# Patient Record
Sex: Female | Born: 1935 | Race: Black or African American | Hispanic: No | Marital: Single | Smoking: Former smoker
Health system: Southern US, Community
[De-identification: ages and names within clinical notes are randomized; demographics above are authoritative.]

## PROBLEM LIST (undated history)

## (undated) DIAGNOSIS — J45909 Unspecified asthma, uncomplicated: Secondary | ICD-10-CM

## (undated) DIAGNOSIS — K219 Gastro-esophageal reflux disease without esophagitis: Secondary | ICD-10-CM

## (undated) DIAGNOSIS — K5900 Constipation, unspecified: Secondary | ICD-10-CM

## (undated) DIAGNOSIS — I10 Essential (primary) hypertension: Secondary | ICD-10-CM

## (undated) DIAGNOSIS — K623 Rectal prolapse: Secondary | ICD-10-CM

## (undated) DIAGNOSIS — E119 Type 2 diabetes mellitus without complications: Secondary | ICD-10-CM

## (undated) DIAGNOSIS — F0390 Unspecified dementia without behavioral disturbance: Secondary | ICD-10-CM

## (undated) HISTORY — DX: Unspecified dementia, unspecified severity, without behavioral disturbance, psychotic disturbance, mood disturbance, and anxiety: F03.90

## (undated) HISTORY — DX: Rectal prolapse: K62.3

## (undated) HISTORY — PX: OTHER SURGICAL HISTORY: SHX169

## (undated) HISTORY — DX: Constipation, unspecified: K59.00

---

## 2016-04-25 ENCOUNTER — Encounter (HOSPITAL_BASED_OUTPATIENT_CLINIC_OR_DEPARTMENT_OTHER): Payer: Self-pay | Admitting: *Deleted

## 2016-04-25 ENCOUNTER — Emergency Department (HOSPITAL_BASED_OUTPATIENT_CLINIC_OR_DEPARTMENT_OTHER)
Admission: EM | Admit: 2016-04-25 | Discharge: 2016-04-25 | Disposition: A | Discharge status: Home / Self Care | Payer: Medicare Other | Attending: Emergency Medicine | Admitting: Emergency Medicine

## 2016-04-25 DIAGNOSIS — I10 Essential (primary) hypertension: Secondary | ICD-10-CM

## 2016-04-25 DIAGNOSIS — E119 Type 2 diabetes mellitus without complications: Secondary | ICD-10-CM | POA: Diagnosis not present

## 2016-04-25 DIAGNOSIS — R112 Nausea with vomiting, unspecified: Secondary | ICD-10-CM | POA: Diagnosis present

## 2016-04-25 DIAGNOSIS — Z79899 Other long term (current) drug therapy: Secondary | ICD-10-CM | POA: Insufficient documentation

## 2016-04-25 DIAGNOSIS — Z7984 Long term (current) use of oral hypoglycemic drugs: Secondary | ICD-10-CM | POA: Insufficient documentation

## 2016-04-25 DIAGNOSIS — L299 Pruritus, unspecified: Secondary | ICD-10-CM | POA: Diagnosis not present

## 2016-04-25 DIAGNOSIS — J45909 Unspecified asthma, uncomplicated: Secondary | ICD-10-CM | POA: Insufficient documentation

## 2016-04-25 HISTORY — DX: Gastro-esophageal reflux disease without esophagitis: K21.9

## 2016-04-25 HISTORY — DX: Essential (primary) hypertension: I10

## 2016-04-25 HISTORY — DX: Unspecified asthma, uncomplicated: J45.909

## 2016-04-25 HISTORY — DX: Type 2 diabetes mellitus without complications: E11.9

## 2016-04-25 LAB — CBC WITH DIFFERENTIAL/PLATELET
BASOS ABS: 0 10*3/uL (ref 0.0–0.1)
BASOS PCT: 0 %
EOS ABS: 0.1 10*3/uL (ref 0.0–0.7)
Eosinophils Relative: 2 %
HCT: 37 % (ref 36.0–46.0)
HEMOGLOBIN: 12.5 g/dL (ref 12.0–15.0)
Lymphocytes Relative: 32 %
Lymphs Abs: 2.4 10*3/uL (ref 0.7–4.0)
MCH: 29.8 pg (ref 26.0–34.0)
MCHC: 33.8 g/dL (ref 30.0–36.0)
MCV: 88.3 fL (ref 78.0–100.0)
MONO ABS: 0.6 10*3/uL (ref 0.1–1.0)
MONOS PCT: 7 %
NEUTROS PCT: 59 %
Neutro Abs: 4.4 10*3/uL (ref 1.7–7.7)
Platelets: 346 10*3/uL (ref 150–400)
RBC: 4.19 MIL/uL (ref 3.87–5.11)
RDW: 12.3 % (ref 11.5–15.5)
WBC: 7.4 10*3/uL (ref 4.0–10.5)

## 2016-04-25 LAB — COMPREHENSIVE METABOLIC PANEL
ALK PHOS: 50 U/L (ref 38–126)
ALT: 9 U/L — AB (ref 14–54)
AST: 20 U/L (ref 15–41)
Albumin: 3.6 g/dL (ref 3.5–5.0)
Anion gap: 9 (ref 5–15)
BILIRUBIN TOTAL: 0.4 mg/dL (ref 0.3–1.2)
BUN: 10 mg/dL (ref 6–20)
CALCIUM: 9.3 mg/dL (ref 8.9–10.3)
CO2: 27 mmol/L (ref 22–32)
CREATININE: 0.58 mg/dL (ref 0.44–1.00)
Chloride: 99 mmol/L — ABNORMAL LOW (ref 101–111)
GFR calc non Af Amer: >60 mL/min (ref >60)
Glucose, Bld: 130 mg/dL — ABNORMAL HIGH (ref 65–99)
Potassium: 4.4 mmol/L (ref 3.5–5.1)
SODIUM: 135 mmol/L (ref 135–145)
TOTAL PROTEIN: 7.4 g/dL (ref 6.5–8.1)

## 2016-04-25 MED ORDER — DIPHENHYDRAMINE HCL 25 MG PO CAPS
25.0000 mg | ORAL_CAPSULE | Freq: Once | ORAL | Status: AC
  Administered 2016-04-25: 25 mg via ORAL
  Filled 2016-04-25: qty 1

## 2016-04-25 MED ORDER — ONDANSETRON 4 MG PO TBDP
4.0000 mg | ORAL_TABLET | Freq: Once | ORAL | Status: AC
  Administered 2016-04-25: 4 mg via ORAL
  Filled 2016-04-25: qty 1

## 2016-04-25 MED ORDER — METOPROLOL TARTRATE 50 MG PO TABS
100.0000 mg | ORAL_TABLET | Freq: Once | ORAL | Status: AC
  Administered 2016-04-25: 100 mg via ORAL
  Filled 2016-04-25: qty 2

## 2016-04-25 NOTE — ED Triage Notes (Signed)
Nausea and itching all over since yesterday.

## 2016-04-25 NOTE — ED Provider Notes (Signed)
[facility] Provider Note   CSN: [id number] Arrival date & time: 04/25/16  1237     History   Chief Complaint Chief Complaint  Patient presents with  . Nausea    HPI Julie Colon is a 82 y.o. female.  The history is provided by the patient and a relative.  Rash   This is a new problem. Episode onset: 1 year. The problem has not changed since onset.The problem is associated with nothing. There has been no fever. Affected Location: whole body espcially extremities. The patient is experiencing no pain. Associated symptoms include itching. Associated symptoms comments: Vomited 3x last night, now with some nausea bout tolerating po. She has tried antihistamines for the symptoms. The treatment provided moderate relief.    Past Medical History:  Diagnosis Date  . Asthma   . Diabetes mellitus without complication (HCC)   . GERD (gastroesophageal reflux disease)   . Hypertension     There are no active problems to display for this patient.   History reviewed. No pertinent surgical history.  OB History    No data available       Home Medications    Prior to Admission medications   Medication Sig Start Date End Date Taking? Authorizing Provider  Atorvastatin Calcium (LIPITOR PO) Take by mouth.   Yes Historical Provider, MD  Budesonide-Formoterol Fumarate (SYMBICORT IN) Inhale into the lungs.   Yes Historical Provider, MD  HYDROCHLOROTHIAZIDE PO Take by mouth.   Yes Historical Provider, MD  METFORMIN HCL PO Take by mouth.   Yes Historical Provider, MD  Pantoprazole Sodium (PROTONIX PO) Take by mouth.   Yes Historical Provider, MD    Family History No family history on file.  Social History Social History  Substance Use Topics  . Smoking status: Never Smoker  . Smokeless tobacco: Never Used  . Alcohol use No     Allergies   Review of patient's allergies indicates no known allergies.   Review of Systems Review of Systems  Skin: Positive for  itching and rash.  All other systems reviewed and are negative.    Physical Exam Updated Vital Signs BP (!) 203/119 (BP Location: Right Arm) Comment: both BPs reported to RN Sharon  Pulse 106   Temp 98.3 F (36.8 C) (Oral)   Resp 20   Ht 5\' 5"  (1.651 m)   SpO2 96%   Physical Exam  Constitutional: She is oriented to person, place, and time. She appears well-developed and well-nourished. No distress.  HENT:  Head: Normocephalic.  Eyes: Conjunctivae are normal.  Neck: Neck supple. No tracheal deviation present.  Cardiovascular: Normal rate, regular rhythm and normal heart sounds.   Pulmonary/Chest: Effort normal and breath sounds normal. No respiratory distress.  Abdominal: Soft. She exhibits no distension. There is no tenderness.  Neurological: She is alert and oriented to person, place, and time.  Skin: Skin is warm and dry. Rash (scant macular prurptic rash on chest) noted.  Psychiatric: She has a normal mood and affect.  Vitals reviewed.    ED Treatments / Results  Labs (all labs ordered are listed, but only abnormal results are displayed) Labs Reviewed - No data to display  EKG  EKG Interpretation None       Radiology No results found.  Procedures Procedures (including critical care time)  Medications Ordered in ED Medications  ondansetron (ZOFRAN-ODT) disintegrating tablet 4 mg (4 mg Oral Given 04/25/16 1317)  diphenhydrAMINE (BENADRYL) capsule 25 mg (25 mg Oral Given 04/25/16 1317)  metoprolol (LOPRESSOR) tablet 100 mg (100 mg Oral Given 04/25/16 1347)     Initial Impression / Assessment and Plan / ED Course  I have reviewed the triage vital signs and the nursing notes.  Pertinent labs & imaging results that were available during my care of the patient were reviewed by me and considered in my medical decision making (see chart for details).  Clinical Course    82 year old female presents accompanied by her daughter with concerns for itching for the  last year and some mild nausea that started today. She has been taking Benadryl intermittently. She has several medications that have the labels rubbed off that her for her chronic medical problems that appear to be from multiple prescribers. She has 2 separate bottles of HCTZ 12.5 mg tablets that she is taking 1 of each daily. I have a great concern that the patient has had no medication reconciliation for her antihypertensive medications and has an ineffective regimen because of lack of proper follow-up and coordination of care. She is hypertensive today but is currently asymptomatic. She was given Zofran and Benadryl to help with her symptoms. Given home dose of metoprolol to help mitigate hypertension. Screening lab work shows no evidence of hepatic etiology of pruritus or other end organ damage related to hypertension. Patient needs to establish primary care in the area and was provided contact information to do so. She is to continue with her current regimen until able to establish follow-up. Return precautions discussed for worsening or new concerning symptoms.   Final Clinical Impressions(s) / ED Diagnoses   Final diagnoses:  Itching  Essential hypertension    New Prescriptions New Prescriptions   No medications on file     Lyndal Pulleyaniel Saanvika Vazques, MD 04/25/16 1446

## 2020-01-14 ENCOUNTER — Ambulatory Visit (HOSPITAL_COMMUNITY)
Admission: EM | Admit: 2020-01-14 | Discharge: 2020-01-14 | Disposition: A | Discharge status: Home / Self Care | Payer: Medicare Other

## 2020-01-14 ENCOUNTER — Other Ambulatory Visit: Payer: Self-pay

## 2020-01-14 ENCOUNTER — Encounter (HOSPITAL_COMMUNITY): Payer: Self-pay

## 2020-01-14 ENCOUNTER — Inpatient Hospital Stay (HOSPITAL_COMMUNITY)
Admission: EM | Admit: 2020-01-14 | Discharge: 2020-01-18 | DRG: 638 | Disposition: A | Discharge status: Home / Self Care | Payer: Medicare (Managed Care) | Attending: Family Medicine | Admitting: Family Medicine

## 2020-01-14 ENCOUNTER — Emergency Department (HOSPITAL_COMMUNITY): Payer: Medicare (Managed Care)

## 2020-01-14 DIAGNOSIS — E875 Hyperkalemia: Secondary | ICD-10-CM | POA: Diagnosis present

## 2020-01-14 DIAGNOSIS — Z79899 Other long term (current) drug therapy: Secondary | ICD-10-CM | POA: Diagnosis not present

## 2020-01-14 DIAGNOSIS — R739 Hyperglycemia, unspecified: Secondary | ICD-10-CM

## 2020-01-14 DIAGNOSIS — N179 Acute kidney failure, unspecified: Secondary | ICD-10-CM | POA: Diagnosis present

## 2020-01-14 DIAGNOSIS — Z7951 Long term (current) use of inhaled steroids: Secondary | ICD-10-CM | POA: Diagnosis not present

## 2020-01-14 DIAGNOSIS — Z7984 Long term (current) use of oral hypoglycemic drugs: Secondary | ICD-10-CM | POA: Diagnosis not present

## 2020-01-14 DIAGNOSIS — J45909 Unspecified asthma, uncomplicated: Secondary | ICD-10-CM | POA: Diagnosis present

## 2020-01-14 DIAGNOSIS — I10 Essential (primary) hypertension: Secondary | ICD-10-CM | POA: Diagnosis present

## 2020-01-14 DIAGNOSIS — E11649 Type 2 diabetes mellitus with hypoglycemia without coma: Secondary | ICD-10-CM | POA: Diagnosis not present

## 2020-01-14 DIAGNOSIS — Z20822 Contact with and (suspected) exposure to covid-19: Secondary | ICD-10-CM | POA: Diagnosis present

## 2020-01-14 DIAGNOSIS — E1101 Type 2 diabetes mellitus with hyperosmolarity with coma: Secondary | ICD-10-CM | POA: Diagnosis present

## 2020-01-14 DIAGNOSIS — K5900 Constipation, unspecified: Secondary | ICD-10-CM | POA: Diagnosis not present

## 2020-01-14 DIAGNOSIS — E118 Type 2 diabetes mellitus with unspecified complications: Secondary | ICD-10-CM | POA: Diagnosis present

## 2020-01-14 DIAGNOSIS — K219 Gastro-esophageal reflux disease without esophagitis: Secondary | ICD-10-CM | POA: Diagnosis present

## 2020-01-14 DIAGNOSIS — E1100 Type 2 diabetes mellitus with hyperosmolarity without nonketotic hyperglycemic-hyperosmolar coma (NKHHC): Secondary | ICD-10-CM

## 2020-01-14 DIAGNOSIS — Z833 Family history of diabetes mellitus: Secondary | ICD-10-CM | POA: Diagnosis not present

## 2020-01-14 LAB — CBG MONITORING, ED
Glucose-Capillary: >600 mg/dL (ref 70–99)
Glucose-Capillary: >600 mg/dL (ref 70–99)
Glucose-Capillary: 550 mg/dL (ref 70–99)
Glucose-Capillary: 470 mg/dL — ABNORMAL HIGH (ref 70–99)
Glucose-Capillary: 348 mg/dL — ABNORMAL HIGH (ref 70–99)
Glucose-Capillary: 176 mg/dL — ABNORMAL HIGH (ref 70–99)
Glucose-Capillary: 139 mg/dL — ABNORMAL HIGH (ref 70–99)

## 2020-01-14 LAB — URINALYSIS, ROUTINE W REFLEX MICROSCOPIC
Bacteria, UA: NONE SEEN
Bilirubin Urine: NEGATIVE
Glucose, UA: >=500 mg/dL — AB
Hgb urine dipstick: NEGATIVE
Ketones, ur: NEGATIVE mg/dL
Leukocytes,Ua: NEGATIVE
Nitrite: NEGATIVE
Protein, ur: NEGATIVE mg/dL
Specific Gravity, Urine: 1.025 (ref 1.005–1.030)
pH: 5 (ref 5.0–8.0)

## 2020-01-14 LAB — BASIC METABOLIC PANEL
Anion gap: 12 (ref 5–15)
Anion gap: 13 (ref 5–15)
Anion gap: 10 (ref 5–15)
BUN: 32 mg/dL — ABNORMAL HIGH (ref 8–23)
BUN: 25 mg/dL — ABNORMAL HIGH (ref 8–23)
BUN: 22 mg/dL (ref 8–23)
CO2: 29 mmol/L (ref 22–32)
CO2: 27 mmol/L (ref 22–32)
CO2: 29 mmol/L (ref 22–32)
Calcium: 9.1 mg/dL (ref 8.9–10.3)
Calcium: 9.1 mg/dL (ref 8.9–10.3)
Calcium: 9.2 mg/dL (ref 8.9–10.3)
Chloride: 87 mmol/L — ABNORMAL LOW (ref 98–111)
Chloride: 98 mmol/L (ref 98–111)
Chloride: 104 mmol/L (ref 98–111)
Creatinine, Ser: 1.32 mg/dL — ABNORMAL HIGH (ref 0.44–1.00)
Creatinine, Ser: 1.05 mg/dL — ABNORMAL HIGH (ref 0.44–1.00)
Creatinine, Ser: 0.92 mg/dL (ref 0.44–1.00)
GFR calc Af Amer: 43 mL/min — ABNORMAL LOW (ref >60)
GFR calc Af Amer: 56 mL/min — ABNORMAL LOW (ref >60)
GFR calc Af Amer: >60 mL/min (ref >60)
GFR calc non Af Amer: 37 mL/min — ABNORMAL LOW (ref >60)
GFR calc non Af Amer: 49 mL/min — ABNORMAL LOW (ref >60)
GFR calc non Af Amer: 57 mL/min — ABNORMAL LOW (ref >60)
Glucose, Bld: 941 mg/dL (ref 70–99)
Glucose, Bld: 274 mg/dL — ABNORMAL HIGH (ref 70–99)
Glucose, Bld: 90 mg/dL (ref 70–99)
Potassium: 5.8 mmol/L — ABNORMAL HIGH (ref 3.5–5.1)
Potassium: 3.7 mmol/L (ref 3.5–5.1)
Potassium: 3.6 mmol/L (ref 3.5–5.1)
Sodium: 128 mmol/L — ABNORMAL LOW (ref 135–145)
Sodium: 138 mmol/L (ref 135–145)
Sodium: 143 mmol/L (ref 135–145)

## 2020-01-14 LAB — POCT I-STAT EG7
Acid-Base Excess: 9 mmol/L — ABNORMAL HIGH (ref 0.0–2.0)
Bicarbonate: 36.1 mmol/L — ABNORMAL HIGH (ref 20.0–28.0)
Calcium, Ion: 1.13 mmol/L — ABNORMAL LOW (ref 1.15–1.40)
HCT: 41 % (ref 36.0–46.0)
Hemoglobin: 13.9 g/dL (ref 12.0–15.0)
O2 Saturation: 56 %
Potassium: 7.3 mmol/L (ref 3.5–5.1)
Sodium: 131 mmol/L — ABNORMAL LOW (ref 135–145)
TCO2: 38 mmol/L — ABNORMAL HIGH (ref 22–32)
pCO2, Ven: 61 mmHg — ABNORMAL HIGH (ref 44.0–60.0)
pH, Ven: 7.38 (ref 7.250–7.430)
pO2, Ven: 31 mmHg — CL (ref 32.0–45.0)

## 2020-01-14 LAB — LIPASE, BLOOD: Lipase: 26 U/L (ref 11–51)

## 2020-01-14 LAB — GLUCOSE, CAPILLARY
Glucose-Capillary: 116 mg/dL — ABNORMAL HIGH (ref 70–99)
Glucose-Capillary: 99 mg/dL (ref 70–99)

## 2020-01-14 LAB — CBC
HCT: 41.4 % (ref 36.0–46.0)
HCT: 39.8 % (ref 36.0–46.0)
Hemoglobin: 12.8 g/dL (ref 12.0–15.0)
Hemoglobin: 12.7 g/dL (ref 12.0–15.0)
MCH: 28.6 pg (ref 26.0–34.0)
MCH: 28.9 pg (ref 26.0–34.0)
MCHC: 30.9 g/dL (ref 30.0–36.0)
MCHC: 31.9 g/dL (ref 30.0–36.0)
MCV: 92.4 fL (ref 80.0–100.0)
MCV: 90.5 fL (ref 80.0–100.0)
Platelets: 333 10*3/uL (ref 150–400)
Platelets: 335 10*3/uL (ref 150–400)
RBC: 4.48 MIL/uL (ref 3.87–5.11)
RBC: 4.4 MIL/uL (ref 3.87–5.11)
RDW: 13.2 % (ref 11.5–15.5)
RDW: 13 % (ref 11.5–15.5)
WBC: 6.9 10*3/uL (ref 4.0–10.5)
WBC: 8.7 10*3/uL (ref 4.0–10.5)
nRBC: 0 % (ref 0.0–0.2)
nRBC: 0 % (ref 0.0–0.2)

## 2020-01-14 LAB — OSMOLALITY: Osmolality: 313 mOsm/kg — ABNORMAL HIGH (ref 275–295)

## 2020-01-14 LAB — HEPATIC FUNCTION PANEL
ALT: 13 U/L (ref 0–44)
AST: 21 U/L (ref 15–41)
Albumin: 3.3 g/dL — ABNORMAL LOW (ref 3.5–5.0)
Alkaline Phosphatase: 71 U/L (ref 38–126)
Bilirubin, Direct: 0.2 mg/dL (ref 0.0–0.2)
Indirect Bilirubin: 0.4 mg/dL (ref 0.3–0.9)
Total Bilirubin: 0.6 mg/dL (ref 0.3–1.2)
Total Protein: 7.5 g/dL (ref 6.5–8.1)

## 2020-01-14 LAB — HEMOGLOBIN A1C
Hgb A1c MFr Bld: 13.3 % — ABNORMAL HIGH (ref 4.8–5.6)
Mean Plasma Glucose: 335.01 mg/dL

## 2020-01-14 LAB — SARS CORONAVIRUS 2 BY RT PCR (HOSPITAL ORDER, PERFORMED IN ~~LOC~~ HOSPITAL LAB): SARS Coronavirus 2: NEGATIVE

## 2020-01-14 LAB — BETA-HYDROXYBUTYRIC ACID: Beta-Hydroxybutyric Acid: 1.23 mmol/L — ABNORMAL HIGH (ref 0.05–0.27)

## 2020-01-14 MED ORDER — SODIUM CHLORIDE 0.9 % IV BOLUS
1000.0000 mL | INTRAVENOUS | Status: AC
  Administered 2020-01-14: 1000 mL via INTRAVENOUS

## 2020-01-14 MED ORDER — SODIUM CHLORIDE 0.9 % IV SOLN: INTRAVENOUS | Status: DC

## 2020-01-14 MED ORDER — DEXTROSE-NACL 5-0.45 % IV SOLN: INTRAVENOUS | Status: DC

## 2020-01-14 MED ORDER — INSULIN REGULAR(HUMAN) IN NACL 100-0.9 UT/100ML-% IV SOLN
INTRAVENOUS | Status: DC
  Filled 2020-01-14: qty 100

## 2020-01-14 MED ORDER — LOSARTAN POTASSIUM 50 MG PO TABS
100.0000 mg | ORAL_TABLET | Freq: Every day | ORAL | Status: DC
  Administered 2020-01-15 – 2020-01-18 (×4): 100 mg via ORAL
  Filled 2020-01-14 (×4): qty 2

## 2020-01-14 MED ORDER — ENOXAPARIN SODIUM 40 MG/0.4ML ~~LOC~~ SOLN
40.0000 mg | SUBCUTANEOUS | Status: DC
  Administered 2020-01-15 – 2020-01-17 (×3): 40 mg via SUBCUTANEOUS
  Filled 2020-01-14 (×3): qty 0.4

## 2020-01-14 MED ORDER — INSULIN REGULAR(HUMAN) IN NACL 100-0.9 UT/100ML-% IV SOLN
INTRAVENOUS | Status: DC
  Administered 2020-01-14: 13 [IU]/h via INTRAVENOUS

## 2020-01-14 MED ORDER — PANTOPRAZOLE SODIUM 40 MG PO TBEC
40.0000 mg | DELAYED_RELEASE_TABLET | Freq: Every day | ORAL | Status: DC
  Administered 2020-01-15 – 2020-01-18 (×4): 40 mg via ORAL
  Filled 2020-01-14 (×4): qty 1

## 2020-01-14 MED ORDER — DEXTROSE 50 % IV SOLN: 0.0000 mL | INTRAVENOUS | Status: DC | PRN

## 2020-01-14 NOTE — H&P (Signed)
History and Physical    Julie Colon HKV:[id number] DOB: 11-01-1933 DOA: 01/14/2020  PCP: System, Pcp Not In  Patient coming from: Urgent care center  I have personally briefly reviewed patient's old medical records in [place]  Chief Complaint: Hyperglycemia  HPI: Julie Colon is a 86 y.o. female with medical history significant of diabetes type 2 asthma, GERD and hypertension who presents to the emergency department at the behest of the urgent care center where her blood sugars were noted to be very high.  Patient has recently moved here from [place] and her daughter is familiarizing herself with her history.  Daughter states that patient takes her own medications at home and has not been taking them correctly.  Some medications to be taken once a day she has been taking twice a day and other medications should be taken twice a day that she has been taking once a day.  Patient started complaining of abdominal pain and the daughter became concerned and brought her to the urgent care center.  There she was noted to have a blood glucose that was reading very high and she was referred to the emergency department.  Patient does not take any insulin at home and uses Januvia only.  In the emergency department she is awake and alert and talking.  Patient complains of nausea, vomiting, and abdominal discomfort.  Had polyuria but no dysuria.  She denies any fevers, chills, headache, diarrhea, constipation, weight loss, or weight gain.  She is very very thirsty.  Nothing makes her symptoms better and nothing makes them worse.  Her symptoms are severe in nature.  Her daughter reports a hospitalization in [place] about 4 months ago for similar problems with abdominal pain and elevated blood glucoses.  No information is available in care everywhere.  Daughter does not know the name of the hospital.  Letter also states that her blood glucoses have been high for the last 2 weeks and the patient has had  increasing fatigue.  Patient speaks [place] and her daughter translates.  ED Course: Sodium 128, potassium 5.8, creatinine 1.32, GFR 43, beta hydroxybutyrate 1.23, glucose 941, anion gap 12, found not to be in DKA but is in HHS.  Work-up for occult infection with urine chest x-ray do not show any likely source.  Referred to internal medicine for further evaluation and management  Review of Systems: As per HPI otherwise all other systems reviewed and  negative.   Past Medical History:  Diagnosis Date  . Asthma   . Diabetes mellitus without complication ([place])   . GERD (gastroesophageal reflux disease)   . Hypertension     History reviewed. No pertinent surgical history.  Social History   Social History Narrative   Speaks [place] (from the [place])   [place] to [facility] May 1. 2021 from [place].  Daughter not well versed in mother's care yet     reports that she has never smoked. She has never used smokeless tobacco. She reports that she does not drink alcohol or use drugs.  No Known Allergies  Family History  Problem Relation Age of Onset  . Diabetes Mother     Prior to Admission medications   Medication Sig Start Date End Date Taking? Authorizing Provider  bumetanide (BUMEX) 1 MG tablet Take 1 mg by mouth daily.   Yes [provider]  losartan (COZAAR) 100 MG tablet Take 100 mg by mouth daily.   Yes [provider]  omeprazole (PRILOSEC) 20 MG capsule Take 20  mg by mouth 2 (two) times daily before a meal.   Yes [provider]  Pantoprazole Sodium (PROTONIX PO) Take 40 mg by mouth daily.    Yes [provider]  sitaGLIPtin (JANUVIA) 100 MG tablet Take 100 mg by mouth daily.   Yes [provider]    Physical Exam:  Constitutional: NAD, calm, comfortable Vitals:   01/14/20 1615 01/14/20 1630 01/14/20 1645 01/14/20 1730  BP:  (!) 143/93 (!) 149/136 (!) 146/96  Pulse: (!) 106  72 80  Resp:      Temp:      TempSrc:      SpO2: 91%   98% 100%   Eyes: PERRL, lids and conjunctivae normal ENMT: Mucous membranes are dry. Posterior pharynx clear of any exudate or lesions.Normal dentition.  Neck: normal, supple, no masses, no thyromegaly Respiratory: clear to auscultation bilaterally, no wheezing, no crackles. Normal respiratory effort. No accessory muscle use.  Cardiovascular: Regular rate and rhythm, no murmurs / rubs / gallops. No extremity edema. 2+ pedal pulses. No carotid bruits.  Abdomen: Mild diffuse tenderness, no masses palpated. No hepatosplenomegaly. Bowel sounds positive.  Musculoskeletal: no clubbing / cyanosis. No joint deformity upper and lower extremities. Good ROM, no contractures. Normal muscle tone.  Skin: Dry with tenting, no rashes, lesions, ulcers. No induration Neurologic: CN 2-12 grossly intact. Sensation intact, DTR normal. Strength 5/5 in all 4.  Psychiatric: Normal judgment and insight. Alert and oriented x 3. Normal mood.    Labs on Admission: I have personally reviewed following labs and imaging studies  CBC: Recent Labs  Lab 01/14/20 1131 01/14/20 1512  WBC 6.9  --   HGB 12.8 13.9  HCT 41.4 41.0  MCV 92.4  --   PLT 333  --    Basic Metabolic Panel: Recent Labs  Lab 01/14/20 1131 01/14/20 1512  NA 128* 131*  K 5.8* 7.3*  CL 87*  --   CO2 29  --   GLUCOSE 941*  --   BUN 32*  --   CREATININE 1.32*  --   CALCIUM 9.1  --    CBG: Recent Labs  Lab 01/14/20 1119 01/14/20 1342 01/14/20 1639 01/14/20 1744  GLUCAP >600* >600* 550* 470*   Urine analysis:    Component Value Date/Time   COLORURINE STRAW (A) 01/14/2020 1126   APPEARANCEUR CLEAR 01/14/2020 1126   LABSPEC 1.025 01/14/2020 1126   PHURINE 5.0 01/14/2020 1126   GLUCOSEU >=500 (A) 01/14/2020 1126   HGBUR NEGATIVE 01/14/2020 1126   BILIRUBINUR NEGATIVE 01/14/2020 1126   KETONESUR NEGATIVE 01/14/2020 1126   PROTEINUR NEGATIVE 01/14/2020 1126   NITRITE NEGATIVE 01/14/2020 1126   LEUKOCYTESUR NEGATIVE 01/14/2020 1126     Radiological Exams on Admission: DG Chest Portable 1 View  Result Date: 01/14/2020 CLINICAL DATA:  Altered mental status. Generalized weakness. Fatigue. EXAM: PORTABLE CHEST 1 VIEW COMPARISON:  None. FINDINGS: Normal sized heart. Tortuous aorta. Clear lungs with normal vascularity. Mild bilateral shoulder degenerative changes. IMPRESSION: No acute abnormality. Electronically Signed   By: Beckie Salts M.D.   On: 01/14/2020 14:34     Assessment/Plan Principal Problem:   Hyperglycemic hyperosmolar nonketotic coma (HCC) Active Problems:   Hyperkalemia   AKI (acute kidney injury) (HCC)   Type 2 diabetes mellitus with complication, without long-term current use of insulin (HCC)    1.  Hyperglycemic hyperosmolar nonketotic coma: Patient will be admitted to the progressive care unit and placed on the insulin drip protocol for her current condition based on blood  glucose of 900 and beta hydroxybutyrate of 1.2 with a normal anion gap.  Patient is very dry we will continue IV fluids for the next 24 hours and encourage p.o. fluid intake of noncaloric fluids.  Monitor blood glucoses closely and continue fluid resuscitation.  2.  Hyperkalemia with a potassium of 7.  I suspect that this will improve significantly once insulin drip has begun.  Patient is currently refusing to let us obtain blood.  She is awake and alert and stat EKG has been ordered.  3.  Acute kidney injury: Patient clearly dry and requires hydration.  I suspect that this situation has been worsening over the past 2 weeks given the length of her symptoms.  Her daughter would benefit from significant diabetes education as with the patient.  Patient will require home health at discharge.  4.  Type 2 diabetes mellitus with complication without long-term current use of insulin: I suspect patient will require insulin at discharge.  Hemoglobin A1c is pending but I believe that it will likely be very high.  She also has had a recent admission  to the hospital for a similar problem.  We will need to get translator to work with the patient.  She will likely need home health for home medication monitoring at discharge.  DVT prophylaxis: Subcu heparin Code Status: Full code Family Communication: Spoke with patient's daughter who was present at the time of admission all questions answered Disposition Plan: Likely home with home health in 3 to 4 days Consults called: None Admission status: Admit - It is my clinical opinion that admission to INPATIENT is reasonable and necessary because of the expectation that this patient will require hospital care that crosses at least 2 midnights to treat this condition based on the medical complexity of the problems presented.  Given the aforementioned information, the predictability of an adverse outcome is felt to be significant.    Lahoma Crocker MD FACP [facility] Pager [phone]  How to contact the [facility] Attending or Consulting provider 7A - 7P or covering provider during after hours 7P -7A, for this patient?  1. Check the care team in [facility] and look for a) attending/consulting [facility] provider listed and b) the [facility] team listed 2. Log into [email or web address] and use [place]'s universal password to access. If you do not have the password, please contact the hospital operator. 3. Locate the [facility] provider you are looking for under [facility] and page to a number that you can be directly reached. 4. If you still have difficulty reaching the provider, please page the [facility] (Director on Call) for the Hospitalists listed on amion for assistance.  If 7PM-7AM, please contact night-coverage [email or web address] Password [facility]  01/14/2020, 6:07 PM

## 2020-01-14 NOTE — ED Provider Notes (Signed)
Discussed with nurse 86 year old with abdominal pain, weakness, and a blood sugar at home that read "H" on their meter Has been having blood sugars in the 3-400 range lately I recommend that she be seen in the emergency room.  Clearly will need IV fluids and insulin, care that we cannot provide at the urgent care center She is with family that will take her safely to the emergency department   Eustace Moore, MD 01/14/20 1039

## 2020-01-14 NOTE — ED Notes (Signed)
Pt is currently refusing female assistance to place EKG monitoring.

## 2020-01-14 NOTE — ED Notes (Signed)
IV team at bedside 

## 2020-01-14 NOTE — ED Notes (Signed)
Pt and daughter reports that pt's home blood glucose has been reading "high" for approx 1 week and pt is weak, can't stand up/walk and has abdom pain. Spoke with Dr. Delton See who advised for pt to go to ER STAT for higher level eval/tx. Pt and daughter verbalized understanding/agreement.

## 2020-01-14 NOTE — ED Triage Notes (Signed)
Pt arrives to ED w/ c/o hyperglycemia. Pt states blood sugar has been difficult to control over the last two weeks. CBG > 600 in triage.

## 2020-01-14 NOTE — ED Notes (Signed)
This RN attempted 1x for USGIV insertion and blood draw; unsuccessful, phlebotomy notified.

## 2020-01-14 NOTE — ED Notes (Signed)
This RN attempted to call and give report x1. RN will re-attempt in 10-20 min

## 2020-01-14 NOTE — ED Notes (Signed)
Phlebotomy to draw hemolyzed blood specimens when available.

## 2020-01-14 NOTE — ED Provider Notes (Signed)
[facility] EMERGENCY DEPARTMENT Provider Note   CSN: [id number] Arrival date & time: 01/14/20  1053     History Chief Complaint  Patient presents with   Hyperglycemia    Julie Colon is a 86 y.o. female.  The history is provided by the patient and medical records. The history is limited by a language barrier. No language interpreter was used (daughter interopreted).  Hyperglycemia Blood sugar level PTA:  High Severity:  Severe Onset quality:  Gradual Timing:  Constant Progression:  Unchanged Chronicity:  New Diabetes status:  Controlled with oral medications Relieved by:  Nothing Ineffective treatments:  None tried Associated symptoms: abdominal pain, dehydration, fatigue, increased thirst, polyuria and vomiting   Associated symptoms: no altered mental status, no blurred vision, no chest pain, no confusion, no diaphoresis, no dizziness, no dysuria, no fever, no nausea, no shortness of breath and no weakness (increased generalized fatigue and weakness)        Past Medical History:  Diagnosis Date   Asthma    Diabetes mellitus without complication (HCC)    GERD (gastroesophageal reflux disease)    Hypertension     There are no problems to display for this patient.   History reviewed. No pertinent surgical history.   OB History   No obstetric history on file.     History reviewed. No pertinent family history.  Social History   Tobacco Use   Smoking status: Never Smoker   Smokeless tobacco: Never Used  Substance Use Topics   Alcohol use: No   Drug use: No    Home Medications Prior to Admission medications   Medication Sig Start Date End Date Taking? Authorizing Provider  Atorvastatin Calcium (LIPITOR PO) Take by mouth.    [provider]  Budesonide-Formoterol Fumarate (SYMBICORT IN) Inhale into the lungs.    [provider]  HYDROCHLOROTHIAZIDE PO Take by mouth.    [provider]  METFORMIN HCL  PO Take by mouth.    [provider]  Pantoprazole Sodium (PROTONIX PO) Take by mouth.    [provider]    Allergies    Patient has no known allergies.  Review of Systems   Review of Systems  Constitutional: Positive for fatigue. Negative for chills, diaphoresis and fever.  HENT: Negative for congestion.   Eyes: Negative for blurred vision, photophobia and visual disturbance.  Respiratory: Negative for cough, chest tightness, shortness of breath and wheezing.   Cardiovascular: Negative for chest pain, palpitations and leg swelling.  Gastrointestinal: Positive for abdominal pain and vomiting. Negative for abdominal distention, constipation, diarrhea and nausea.  Endocrine: Positive for polydipsia and polyuria.  Genitourinary: Negative for dysuria, flank pain and frequency.  Musculoskeletal: Negative for back pain, neck pain and neck stiffness.  Skin: Negative for rash and wound.  Neurological: Negative for dizziness, weakness (increased generalized fatigue and weakness), light-headedness, numbness and headaches.  Psychiatric/Behavioral: Negative for agitation and confusion.  All other systems reviewed and are negative.   Physical Exam Updated Vital Signs BP 135/85 (BP Location: Left Arm)    Pulse (!) 114    Temp 98.5 F (36.9 C) (Oral)    Resp 18    SpO2 98%   Physical Exam Vitals and nursing note reviewed.  Constitutional:      General: She is not in acute distress.    Appearance: She is well-developed. She is not ill-appearing, toxic-appearing or diaphoretic.  HENT:     Head: Normocephalic and atraumatic.     Nose: No congestion  or rhinorrhea.     Mouth/Throat:     Mouth: Mucous membranes are moist.     Pharynx: No oropharyngeal exudate or posterior oropharyngeal erythema.  Eyes:     Extraocular Movements: Extraocular movements intact.     Conjunctiva/sclera: Conjunctivae normal.     Pupils: Pupils are equal, round, and reactive to light.    Cardiovascular:     Rate and Rhythm: Normal rate and regular rhythm.     Pulses: Normal pulses.     Heart sounds: No murmur.  Pulmonary:     Effort: Pulmonary effort is normal. No respiratory distress.     Breath sounds: Normal breath sounds. No wheezing, rhonchi or rales.  Chest:     Chest wall: No tenderness.  Abdominal:     General: Abdomen is flat.     Palpations: Abdomen is soft.     Tenderness: There is no abdominal tenderness. There is no right CVA tenderness, left CVA tenderness, guarding or rebound.  Musculoskeletal:        General: No tenderness.     Cervical back: Neck supple. No tenderness.     Right lower leg: No edema.     Left lower leg: No edema.  Skin:    General: Skin is warm and dry.     Capillary Refill: Capillary refill takes less than 2 seconds.     Findings: No erythema, lesion or rash.  Neurological:     General: No focal deficit present.     Mental Status: She is alert.  Psychiatric:        Mood and Affect: Mood normal.     ED Results / Procedures / Treatments   Labs (all labs ordered are listed, but only abnormal results are displayed) Labs Reviewed  BASIC METABOLIC PANEL - Abnormal; Notable for the following components:      Result Value   Sodium 128 (*)    Potassium 5.8 (*)    Chloride 87 (*)    Glucose, Bld 941 (*)    BUN 32 (*)    Creatinine, Ser 1.32 (*)    GFR calc non Af Amer 37 (*)    GFR calc Af Amer 43 (*)    All other components within normal limits  URINALYSIS, ROUTINE W REFLEX MICROSCOPIC - Abnormal; Notable for the following components:   Color, Urine STRAW (*)    Glucose, UA >=500 (*)    All other components within normal limits  BETA-HYDROXYBUTYRIC ACID - Abnormal; Notable for the following components:   Beta-Hydroxybutyric Acid 1.23 (*)    All other components within normal limits  CBG MONITORING, ED - Abnormal; Notable for the following components:   Glucose-Capillary >600 (*)    All other components within normal  limits  CBG MONITORING, ED - Abnormal; Notable for the following components:   Glucose-Capillary >600 (*)    All other components within normal limits  CBG MONITORING, ED - Abnormal; Notable for the following components:   Glucose-Capillary 550 (*)    All other components within normal limits  POCT I-STAT EG7 - Abnormal; Notable for the following components:   pCO2, Ven 61.0 (*)    pO2, Ven 31.0 (*)    Bicarbonate 36.1 (*)    TCO2 38 (*)    Acid-Base Excess 9.0 (*)    Sodium 131 (*)    Potassium 7.3 (*)    Calcium, Ion 1.13 (*)    All other components within normal limits  URINE CULTURE  SARS CORONAVIRUS 2 BY  RT PCR (HOSPITAL ORDER, PERFORMED IN [facility])  CBC  OSMOLALITY  HEPATIC FUNCTION PANEL  LIPASE, BLOOD  CBC  BASIC METABOLIC PANEL  BASIC METABOLIC PANEL  BASIC METABOLIC PANEL  BASIC METABOLIC PANEL  OSMOLALITY  HEMOGLOBIN A1C  BASIC METABOLIC PANEL  I-STAT VENOUS BLOOD GAS, ED    EKG None  Radiology DG Chest Portable 1 View  Result Date: 01/14/2020 CLINICAL DATA:  Altered mental status. Generalized weakness. Fatigue. EXAM: PORTABLE CHEST 1 VIEW COMPARISON:  None. FINDINGS: Normal sized heart. Tortuous aorta. Clear lungs with normal vascularity. Mild bilateral shoulder degenerative changes. IMPRESSION: No acute abnormality. Electronically Signed   By: Beckie Salts M.D.   On: 01/14/2020 14:34    Procedures Procedures (including critical care time)  CRITICAL CARE Performed by: Canary Brim [place] Inthavong Total critical care time: 35 minutes Critical care time was exclusive of separately billable procedures and treating other patients. Critical care was necessary to treat or prevent imminent or life-threatening deterioration. Critical care was time spent personally by me on the following activities: development of treatment plan with patient and/or surrogate as well as nursing, discussions with consultants, evaluation of patient's response to treatment,  examination of patient, obtaining history from patient or surrogate, ordering and performing treatments and interventions, ordering and review of laboratory studies, ordering and review of radiographic studies, pulse oximetry and re-evaluation of patient's condition.   Medications Ordered in ED Medications  dextrose 5 %-0.45 % sodium chloride infusion ( Intravenous Hold 01/14/20 1620)  dextrose 50 % solution 0-50 mL (has no administration in time range)  0.9 %  sodium chloride infusion ( Intravenous New Bag/Given 01/14/20 1645)  enoxaparin (LOVENOX) injection 40 mg (40 mg Subcutaneous Refused 01/14/20 1644)  0.9 %  sodium chloride infusion ( Intravenous Not Given 01/14/20 1644)  dextrose 5 %-0.45 % sodium chloride infusion ( Intravenous Hold 01/14/20 1645)  insulin regular, human (MYXREDLIN) 100 units/ 100 mL infusion (13 Units/hr Intravenous New Bag/Given 01/14/20 1646)  sodium chloride 0.9 % bolus 1,000 mL (0 mLs Intravenous Stopped 01/14/20 1619)    ED Course  I have reviewed the triage vital signs and the nursing notes.  Pertinent labs & imaging results that were available during my care of the patient were reviewed by me and considered in my medical decision making (see chart for details).    MDM Rules/Calculators/A&P                      Madylyn Tumlin is a 86 y.o. female with a past medical history significant for diabetes, hypertension, GERD, and asthma who presents with hyperglycemia.  According to patient, she has been having some nausea, vomiting, abdominal cramping, increased urination, and fatigue for the last 2 weeks.  She has been checking her sugars and they have been staying high despite taking home glucose medications.  She presents to the emergency room for evaluation.  She recently moved to [place] from [place] and has not gotten established here.  She denies any chest pain, palpitations, or shortness of breath.  She denies any fevers or chills.  She denies any  constipation or diarrhea today.  On exam, lungs are clear and chest is nontender.  Abdomen is nontender.  She is slightly tachycardic and appears very dehydrated with dry mucous membranes.  No focal neurologic deficits.  No tenderness in extremities.  No lower extremity swelling seen.  Clinically I am concerned about DKA as her glucose on arrival was over 900.  Patient will  be started on fluids and started on insulin drip.  Patient is not in DKA but does appear to be in HHS given the fatigue and sleepiness.  Patient had work-up to look for occult infection contributing symptoms and her urine did not show infection and x-ray did not show pneumonia.  Patient will be admitted for likely HHS causing her fatigue and mental status changes in the setting of hyperglycemia.  Patient will be admitted for further management of hyperglycemia and fatigue and dehydration.   Final Clinical Impression(s) / ED Diagnoses Final diagnoses:  Hyperglycemia  Hyperosmolar hyperglycemic state (HHS) (HCC)      Clinical Impression: 1. Hyperglycemia   2. Hyperosmolar hyperglycemic state (HHS) ([place])     Disposition: Admit  This note was prepared with assistance of Dragon voice recognition software. Occasional wrong-word or sound-a-like substitutions may have occurred due to the inherent limitations of voice recognition software.     Stormee Duda, Gwenyth Allegra, MD 01/14/20 [phone]

## 2020-01-15 ENCOUNTER — Encounter (HOSPITAL_COMMUNITY): Payer: Self-pay | Admitting: Internal Medicine

## 2020-01-15 LAB — BASIC METABOLIC PANEL
Anion gap: 6 (ref 5–15)
Anion gap: 5 (ref 5–15)
BUN: 22 mg/dL (ref 8–23)
BUN: 20 mg/dL (ref 8–23)
CO2: 31 mmol/L (ref 22–32)
CO2: 31 mmol/L (ref 22–32)
Calcium: 8.8 mg/dL — ABNORMAL LOW (ref 8.9–10.3)
Calcium: 8.9 mg/dL (ref 8.9–10.3)
Chloride: 104 mmol/L (ref 98–111)
Chloride: 105 mmol/L (ref 98–111)
Creatinine, Ser: 0.85 mg/dL (ref 0.44–1.00)
Creatinine, Ser: 0.84 mg/dL (ref 0.44–1.00)
GFR calc Af Amer: >60 mL/min (ref >60)
GFR calc Af Amer: >60 mL/min (ref >60)
GFR calc non Af Amer: >60 mL/min (ref >60)
GFR calc non Af Amer: >60 mL/min (ref >60)
Glucose, Bld: 147 mg/dL — ABNORMAL HIGH (ref 70–99)
Glucose, Bld: 141 mg/dL — ABNORMAL HIGH (ref 70–99)
Potassium: 4.7 mmol/L (ref 3.5–5.1)
Potassium: 4 mmol/L (ref 3.5–5.1)
Sodium: 141 mmol/L (ref 135–145)
Sodium: 141 mmol/L (ref 135–145)

## 2020-01-15 LAB — GLUCOSE, CAPILLARY
Glucose-Capillary: 122 mg/dL — ABNORMAL HIGH (ref 70–99)
Glucose-Capillary: 124 mg/dL — ABNORMAL HIGH (ref 70–99)
Glucose-Capillary: 126 mg/dL — ABNORMAL HIGH (ref 70–99)
Glucose-Capillary: 194 mg/dL — ABNORMAL HIGH (ref 70–99)
Glucose-Capillary: 220 mg/dL — ABNORMAL HIGH (ref 70–99)
Glucose-Capillary: 235 mg/dL — ABNORMAL HIGH (ref 70–99)
Glucose-Capillary: 352 mg/dL — ABNORMAL HIGH (ref 70–99)
Glucose-Capillary: 64 mg/dL — ABNORMAL LOW (ref 70–99)

## 2020-01-15 LAB — MRSA PCR SCREENING: MRSA by PCR: POSITIVE — AB

## 2020-01-15 MED ORDER — INSULIN ASPART 100 UNIT/ML ~~LOC~~ SOLN
0.0000 [IU] | SUBCUTANEOUS | Status: DC
  Administered 2020-01-15: 1 [IU] via SUBCUTANEOUS
  Administered 2020-01-15: 2 [IU] via SUBCUTANEOUS
  Administered 2020-01-15: 3 [IU] via SUBCUTANEOUS
  Administered 2020-01-15: 9 [IU] via SUBCUTANEOUS
  Administered 2020-01-15: 1 [IU] via SUBCUTANEOUS
  Administered 2020-01-15: 3 [IU] via SUBCUTANEOUS
  Administered 2020-01-16: 5 [IU] via SUBCUTANEOUS
  Administered 2020-01-16: 9 [IU] via SUBCUTANEOUS
  Administered 2020-01-16: 2 [IU] via SUBCUTANEOUS
  Administered 2020-01-16: 7 [IU] via SUBCUTANEOUS

## 2020-01-15 MED ORDER — POLYETHYLENE GLYCOL 3350 17 G PO PACK: 17.0000 g | PACK | Freq: Every day | ORAL | Status: DC | PRN

## 2020-01-15 MED ORDER — MUPIROCIN 2 % EX OINT
1.0000 "application " | TOPICAL_OINTMENT | Freq: Two times a day (BID) | CUTANEOUS | Status: DC
  Administered 2020-01-15 – 2020-01-18 (×6): 1 via NASAL
  Filled 2020-01-15: qty 22

## 2020-01-15 MED ORDER — CHLORHEXIDINE GLUCONATE CLOTH 2 % EX PADS
6.0000 | MEDICATED_PAD | Freq: Every day | CUTANEOUS | Status: DC
  Administered 2020-01-15 – 2020-01-18 (×4): 6 via TOPICAL

## 2020-01-15 MED ORDER — SENNOSIDES-DOCUSATE SODIUM 8.6-50 MG PO TABS
1.0000 | ORAL_TABLET | Freq: Every day | ORAL | Status: DC
  Administered 2020-01-15 – 2020-01-16 (×2): 1 via ORAL
  Filled 2020-01-15 (×2): qty 1

## 2020-01-15 MED ORDER — INSULIN GLARGINE 100 UNIT/ML ~~LOC~~ SOLN
10.0000 [IU] | Freq: Every day | SUBCUTANEOUS | Status: DC
  Administered 2020-01-15 – 2020-01-16 (×3): 10 [IU] via SUBCUTANEOUS
  Filled 2020-01-15 (×4): qty 0.1

## 2020-01-15 NOTE — Progress Notes (Addendum)
Inpatient Diabetes Program Recommendations  AACE/ADA: New Consensus Statement on Inpatient Glycemic Control  Target Ranges:  Prepandial:   less than 140 mg/dL      Peak postprandial:   less than 180 mg/dL (1-2 hours)      Critically ill patients:  140 - 180 mg/dL   Results for Julie Colon, Julie Colon (MRN [id number]) as of 01/15/2020 11:10  Ref. Range 01/15/2020 00:06 01/15/2020 00:56 01/15/2020 03:17 01/15/2020 08:16  Glucose-Capillary Latest Ref Range: 70 - 99 mg/dL 126 (H) 122 (H) 124 (H) 194 (H)  Results for Julie Colon, Julie Colon (MRN [id number]) as of 01/15/2020 11:10  Ref. Range 01/14/2020 18:11  Hemoglobin A1C Latest Ref Range: 4.8 - 5.6 % 13.3 (H)   Results for Julie Colon, Julie Colon (MRN [id number]) as of 01/15/2020 11:10  Ref. Range 01/14/2020 11:31 01/14/2020 15:11  Beta-Hydroxybutyric Acid Latest Ref Range: 0.05 - 0.27 mmol/L  1.23 (H)  Glucose Latest Ref Range: 70 - 99 mg/dL 941 (HH)    Review of Glycemic Control  Diabetes history: DM2 Outpatient Diabetes medications: Januvia 100 mg daily Current orders for Inpatient glycemic control: Lantus 10 units QHS, Novolog 0-9 units Q4H  Inpatient Diabetes Program Recommendations:   HbgA1C:  A1C 13.3% on 01/14/20 indicating an average glucose of 335 mg/dl over the past 2-3 months. Anticipate patient will need to take insulin as an outpatient. Will ask bedside RNs to work with patient and her daughter on insulin administration and allow patient to self inject insulin while inpatient.  NOTE: Noted consult for Diabetes Coordinator. Diabetes Coordinator is not on campus over the weekend but available by pager from 8am to 5pm for questions or concerns. Chart reviewed. Noted patient speaks [place], recently moved to [place] from [place], and was hospitalized in [place] about 4 months ago with similar symptoms. Per H&P, patient's daughter translated for patient. Called patient' room number and person answered the phone had limited understanding of English and unable to understand  clearly without using an interpreter. Phone was disconnected (not sure if she hung up accidentally or intentionally). Called back with [employer] [phone]) and patient reported she speaks [place]. Patient was not able to provide much information. She reported that she has had DM for a long time and several family members have DM as well. Patient stated that she take oral DM medication for DM. Inquired if she has ever taken insulin at home and she stated that she only take pills now. Upon further questions, she still was not clear if she has ever taken insulin as an outpatient for DM. Patient was very slow to reply to questions and questions had to be repeated several times and at times responses were not even answering the particular question she was asked. Patient reported it was hard for her to hear over the phone. Informed patient that diabetes coordinator will try to call patient's daughter or call back later when her daughter was there. Called patient's daughter Julie Colon [phone]) and she reports that her mother come to [facility] from [facility] about 2 weeks ago. Patient was living with her other daughter in [place] (that particular daughter has DM and takes insulin). Patient has only been taking Januvia for DM and has never taken insulin as an outpatient. Patient's daughter reports that patient was taking Januvia twice a day instead of once a day. Discussed initial glucose of 941 mg/dl and A1C of 13.3%. Discussed glucose and A1C targets. Explained that patient will likely need to take insulin as an outpatient. She stated that she feels that would  be best for patient to get DM under control. She is unsure how long patient will stay in [place] before going back to [facility] to stay with her other daughter. She stated that she can not read [place] but she can read [place] and would prefer any written education be in [place]. Informed her that I will try to find DM and insulin education in [place] and attach to discharge  instructions. Informed her that TOC would be consulted to assist with arranging follow up since patient will likely discharge on insulin and will need to be seen by a provider for follow up shortly after discharge. Encouraged her to help patient with DM management, to be sure patient takes DM medications as prescribed, and to check glucose 2-3 times a day. Informed her that RNs will be working with her and her mother about DM and insulin administration. Julie Colon verbalized understanding of information discussed and states she has no questions at this time.  Will ask RNs to work with patient and her daughter on insulin administration and allow them to administer insulin while inpatient. Would recommend using a translator for any education to ensure patient and her daughter fully understand. Julie Ancona Roddy, RN an email with DM and insulin education material in [place] and [place] and asked that they be printed off for patient and her daughter. Also copied link for each PDF education handout to discharge instructions.  Thanks, Julie Penner, RN, MSN, CDE Diabetes Coordinator Inpatient Diabetes Program [phone] (Team Pager from 8am to 5pm)

## 2020-01-15 NOTE — Progress Notes (Signed)
RN tried to teach the patient to self administer insulin with the help of an interpreter but the patient refused. Will reinforce it again.

## 2020-01-15 NOTE — Progress Notes (Signed)
PROGRESS NOTE  Julie Colon  KDT:[id number] DOB: 01-28-1934 DOA: 01/14/2020 PCP: In [place] Brief Narrative: Julie Colon is an 86 y.o. female with a history of T2DM on januvia only, HTN, GERD, and asthma who recently came to [facility] to live with her daughter from [place] and presented to urgent care found to be profoundly hyperglycemic and sent to the ED. HHS was diagnosed without precipitating infection, IV fluids and insulin started, and patient admitted 5/14. Insulin has been converted to basal-bolus. HbA1c 13.3%, so the patient will require insulin at discharge. Teaching is underway.   Assessment & Plan: Principal Problem:   Hyperglycemic hyperosmolar nonketotic coma ([place]) Active Problems:   Type 2 diabetes mellitus with complication, without long-term current use of insulin (HCC)   Hyperkalemia   AKI (acute kidney injury) ([place])  Hyperglycemic hyperosmolar nonketotic state: Pt not comatose on arrival. This is resolved.  T2DM: HbA1c 13.3% indicating inadequate control.  - Can continue januvia - Changed over to lantus 10u daily, sensitive SSI with good control. - Diabetes coordinator consulted, offering education. RN to allow patient to administer insulin, continue intensive education.  Hyperkalemia: Resolved.   Acute kidney injury: Prerenal, improved.  - Push po intake, avoid nephrotoxins. Unclear whether baseline creatinine is normal or elevated.   DVT prophylaxis: Lovenox Code Status: Full Family Communication: None at bedside Disposition Plan:  Status is: Inpatient  Remains inpatient appropriate because:Unsafe d/c plan. Patient requires new-start insulin with education and training as well as dose adjustments. This will reasonably cross 2 midnights.  Dispo: The patient is from: Home              Anticipated d/c is to: Home              Anticipated d/c date is: 2 days              Patient currently is not medically stable to d/c.  Consultants:   Diabetes  coordinator  Procedures:   None  Antimicrobials:  None   Subjective: Patient complaining of constipation. Polyuria improved. No vision changes. Came up to [facility] to get dentures/teeth? Plans on going back to [facility] but not sure when. Complaining about not eating yesterday, ate breakfast well today. [place] Dietitian used throughout Sales executive.  Objective: Vitals:   01/14/20 2308 01/15/20 0318 01/15/20 0813 01/15/20 1100  BP: 136/63 101/61 (!) 160/88 (!) 145/82  Pulse: 80 83 (!) 107 93  Resp: 13 15 17 20   Temp: 98.3 F (36.8 C) 98.1 F (36.7 C) 98.4 F (36.9 C) 98.6 F (37 C)  TempSrc: Oral Oral Oral Oral  SpO2: 98% 99% 99% 99%  Weight:      Height:        Intake/Output Summary (Last 24 hours) at 01/15/2020 1402 Last data filed at 01/15/2020 1100 Gross per 24 hour  Intake 1443.69 ml  Output --  Net 1443.69 ml   Filed Weights   01/14/20 2159  Weight: 55.4 kg    Gen: Elderly female in no distress Pulm: Non-labored breathing. Clear to auscultation bilaterally.  CV: Regular rate and rhythm. No murmur, rub, or gallop. No JVD, no pedal edema. GI: Abdomen soft, non-tender, non-distended, with normoactive bowel sounds. No organomegaly or masses felt. Ext: Warm, no deformities Skin: No rashes, lesions or ulcers Neuro: Alert and oriented. No focal neurological deficits. Psych: Judgement and insight appear normal. Mood & affect appropriate.   Data Reviewed: I have personally reviewed following labs and imaging studies  CBC: Recent Labs  Lab 01/14/20  1131 01/14/20 1512 01/14/20 1811  WBC 6.9  --  8.7  HGB 12.8 13.9 12.7  HCT 41.4 41.0 39.8  MCV 92.4  --  90.5  PLT 333  --  335   Basic Metabolic Panel: Recent Labs  Lab 01/14/20 1131 01/14/20 1131 01/14/20 1512 01/14/20 1811 01/14/20 2027 01/15/20 0048 01/15/20 0442  NA 128*   < > 131* 138 143 141 141  K 5.8*   < > 7.3* 3.7 3.6 4.7 4.0  CL 87*  --   --  98 104 104 105  CO2 29  --   --  27  29 31 31   GLUCOSE 941*  --   --  274* 90 147* 141*  BUN 32*  --   --  25* 22 22 20   CREATININE 1.32*  --   --  1.05* 0.92 0.85 0.84  CALCIUM 9.1  --   --  9.1 9.2 8.8* 8.9   < > = values in this interval not displayed.   GFR: Estimated Creatinine Clearance: 37.9 mL/min (by C-G formula based on SCr of 0.84 mg/dL). Liver Function Tests: Recent Labs  Lab 01/14/20 1811  AST 21  ALT 13  ALKPHOS 71  BILITOT 0.6  PROT 7.5  ALBUMIN 3.3*   Recent Labs  Lab 01/14/20 1811  LIPASE 26   No results for input(s): AMMONIA in the last 168 hours. Coagulation Profile: No results for input(s): INR, PROTIME in the last 168 hours. Cardiac Enzymes: No results for input(s): CKTOTAL, CKMB, CKMBINDEX, TROPONINI in the last 168 hours. BNP (last 3 results) No results for input(s): PROBNP in the last 8760 hours. HbA1C: Recent Labs    01/14/20 1811  HGBA1C 13.3*   CBG: Recent Labs  Lab 01/15/20 0006 01/15/20 0056 01/15/20 0317 01/15/20 0816 01/15/20 1119  GLUCAP 126* 122* 124* 194* 220*   Lipid Profile: No results for input(s): CHOL, HDL, LDLCALC, TRIG, CHOLHDL, LDLDIRECT in the last 72 hours. Thyroid Function Tests: No results for input(s): TSH, T4TOTAL, FREET4, T3FREE, THYROIDAB in the last 72 hours. Anemia Panel: No results for input(s): VITAMINB12, FOLATE, FERRITIN, TIBC, IRON, RETICCTPCT in the last 72 hours. Urine analysis:    Component Value Date/Time   COLORURINE STRAW (A) 01/14/2020 1126   APPEARANCEUR CLEAR 01/14/2020 1126   LABSPEC 1.025 01/14/2020 1126   PHURINE 5.0 01/14/2020 1126   GLUCOSEU >=500 (A) 01/14/2020 1126   HGBUR NEGATIVE 01/14/2020 1126   BILIRUBINUR NEGATIVE 01/14/2020 1126   KETONESUR NEGATIVE 01/14/2020 1126   PROTEINUR NEGATIVE 01/14/2020 1126   NITRITE NEGATIVE 01/14/2020 1126   LEUKOCYTESUR NEGATIVE 01/14/2020 1126   Recent Results (from the past 240 hour(s))  SARS Coronavirus 2 by RT PCR (hospital order, performed in [facility] hospital lab)  Nasopharyngeal Nasopharyngeal Swab     Status: None   Collection Time: 01/14/20  4:49 PM   Specimen: Nasopharyngeal Swab  Result Value Ref Range Status   SARS Coronavirus 2 NEGATIVE NEGATIVE Final    Comment: (NOTE) SARS-CoV-2 target nucleic acids are NOT DETECTED. The SARS-CoV-2 RNA is generally detectable in upper and lower respiratory specimens during the acute phase of infection. The lowest concentration of SARS-CoV-2 viral copies this assay can detect is 250 copies / mL. A negative result does not preclude SARS-CoV-2 infection and should not be used as the sole basis for treatment or other patient management decisions.  A negative result may occur with improper specimen collection / handling, submission of specimen other than nasopharyngeal swab, presence of viral mutation(s)  within the areas targeted by this assay, and inadequate number of viral copies (<250 copies / mL). A negative result must be combined with clinical observations, patient history, and epidemiological information. Fact Sheet for Patients:   BoilerBrush.com.cy Fact Sheet for Healthcare Providers: https://pope.com/ This test is not yet approved or cleared  by the [place] FDA and has been authorized for detection and/or diagnosis of SARS-CoV-2 by FDA under an Emergency Use Authorization (EUA).  This EUA will remain in effect (meaning this test can be used) for the duration of the COVID-19 declaration under Section 564(b)(1) of the Act, 21 U.S.C. section 360bbb-3(b)(1), unless the authorization is terminated or revoked sooner. Performed at [facility], [address] [place], [place] [address]   MRSA PCR Screening     Status: Abnormal   Collection Time: 01/14/20 10:13 PM   Specimen: Nasopharyngeal  Result Value Ref Range Status   MRSA by PCR POSITIVE (A) NEGATIVE Final    Comment:        The GeneXpert MRSA Assay (FDA approved for NASAL  specimens only), is one component of a comprehensive MRSA colonization surveillance program. It is not intended to diagnose MRSA infection nor to guide or monitor treatment for MRSA infections. RESULT CALLED TO, READ BACK BY AND VERIFIED WITHKathie Rhodes [facility] RN 01/15/20 0114 JDW Performed at [facility], [address], [place], [place] [address]       Radiology Studies: DG Chest Portable 1 View  Result Date: 01/14/2020 CLINICAL DATA:  Altered mental status. Generalized weakness. Fatigue. EXAM: PORTABLE CHEST 1 VIEW COMPARISON:  None. FINDINGS: Normal sized heart. Tortuous aorta. Clear lungs with normal vascularity. Mild bilateral shoulder degenerative changes. IMPRESSION: No acute abnormality. Electronically Signed   By: Beckie Salts M.D.   On: 01/14/2020 14:34    Scheduled Meds: . Chlorhexidine Gluconate Cloth  6 each Topical Q0600  . enoxaparin (LOVENOX) injection  40 mg Subcutaneous Q24H  . insulin aspart  0-9 Units Subcutaneous Q4H  . insulin glargine  10 Units Subcutaneous QHS  . losartan  100 mg Oral Daily  . mupirocin ointment  1 application Nasal BID  . pantoprazole  40 mg Oral Daily   Continuous Infusions:   LOS: 1 day   Time spent: 35 minutes.  Tyrone Nine, MD [facility] [email or web address] 01/15/2020, 2:02 PM

## 2020-01-15 NOTE — Plan of Care (Signed)
  Problem: Health Behavior/Discharge Planning: Goal: Ability to manage health-related needs will improve Outcome: Progressing   Problem: Metabolic: Goal: Ability to maintain appropriate glucose levels will improve Outcome: Progressing   Problem: Nutritional: Goal: Maintenance of adequate nutrition will improve Outcome: Progressing   Problem: Tissue Perfusion: Goal: Adequacy of tissue perfusion will improve Outcome: Progressing   Problem: Clinical Measurements: Goal: Will remain free from infection Outcome: Progressing Goal: Diagnostic test results will improve Outcome: Progressing

## 2020-01-16 LAB — GLUCOSE, CAPILLARY
Glucose-Capillary: 119 mg/dL — ABNORMAL HIGH (ref 70–99)
Glucose-Capillary: 154 mg/dL — ABNORMAL HIGH (ref 70–99)
Glucose-Capillary: 254 mg/dL — ABNORMAL HIGH (ref 70–99)
Glucose-Capillary: 330 mg/dL — ABNORMAL HIGH (ref 70–99)
Glucose-Capillary: 361 mg/dL — ABNORMAL HIGH (ref 70–99)
Glucose-Capillary: 95 mg/dL (ref 70–99)

## 2020-01-16 MED ORDER — SENNOSIDES-DOCUSATE SODIUM 8.6-50 MG PO TABS: 1.0000 | ORAL_TABLET | Freq: Every evening | ORAL | Status: DC | PRN

## 2020-01-16 MED ORDER — POLYETHYLENE GLYCOL 3350 17 G PO PACK
17.0000 g | PACK | Freq: Every day | ORAL | Status: DC
  Administered 2020-01-16 – 2020-01-18 (×3): 17 g via ORAL
  Filled 2020-01-16 (×3): qty 1

## 2020-01-16 NOTE — Plan of Care (Signed)
  Problem: Health Behavior/Discharge Planning: Goal: Ability to manage health-related needs will improve Outcome: Progressing   Problem: Metabolic: Goal: Ability to maintain appropriate glucose levels will improve Outcome: Progressing   Problem: Nutritional: Goal: Maintenance of adequate nutrition will improve Outcome: Progressing   Problem: Skin Integrity: Goal: Risk for impaired skin integrity will decrease Outcome: Progressing   Problem: Tissue Perfusion: Goal: Adequacy of tissue perfusion will improve Outcome: Progressing   Problem: Health Behavior/Discharge Planning: Goal: Ability to manage health-related needs will improve Outcome: Progressing   Problem: Clinical Measurements: Goal: Ability to maintain clinical measurements within normal limits will improve Outcome: Progressing Goal: Will remain free from infection Outcome: Progressing   Problem: Activity: Goal: Risk for activity intolerance will decrease Outcome: Progressing   Problem: Coping: Goal: Level of anxiety will decrease Outcome: Progressing   Problem: Safety: Goal: Ability to remain free from injury will improve Outcome: Progressing

## 2020-01-16 NOTE — Plan of Care (Signed)

## 2020-01-16 NOTE — Progress Notes (Signed)
Hypoglycemic Event  CBG: 64 @ 1156  Treatment: 4 oz. Apple juice  Symptoms:Asymptomatic  Follow-up CBG: Time:0020 CBG Result:95  Possible Reasons for Event: Poor eating  Comments/MD notified:  Low Blood sugar treated    Jodean Lima

## 2020-01-16 NOTE — Progress Notes (Addendum)
Patient refused to draw blood for lab work. Will notify the day shift RN and will try to get it done during the day. MD made aware.

## 2020-01-16 NOTE — Progress Notes (Addendum)
PROGRESS NOTE  Julie Colon  ZOX:[id number] DOB: 10-27-1933 DOA: 01/14/2020 PCP: In [place] Brief Narrative: Julie Colon is an 86 y.o. female with a history of T2DM on januvia only, HTN, GERD, and asthma who recently came to [facility] to live with her daughter from [place] and presented to urgent care found to be profoundly hyperglycemic and sent to the ED. HHS was diagnosed without precipitating infection, IV fluids and insulin started, and patient admitted 5/14. Insulin has been converted to basal-bolus. HbA1c 13.3%, so the patient will require insulin at discharge. Teaching is underway.   Assessment & Plan: Principal Problem:   Hyperglycemic hyperosmolar nonketotic coma ([place]) Active Problems:   Type 2 diabetes mellitus with complication, without long-term current use of insulin (HCC)   Hyperkalemia   AKI (acute kidney injury) ([place])  Hyperglycemic hyperosmolar nonketotic state: Pt not comatose on arrival. This is resolved.  T2DM: HbA1c 13.3% indicating inadequate control and need for insulin.  - Can continue januvia - Continue lantus 10u daily, sensitive SSI. Had asymptomatic mild hypoglycemia last night after insulin for CBG of 352. AM fasting CBG improved. - Diabetes coordinator consulted, offering education. RN to allow patient to administer insulin, continue intensive education. Patient and daughter require ongoing education and instruction. Will need an additional day for insulin dose finding and instruction for safe discharge.  - CM consulted for assistance arranging PCP follow up  Hyperkalemia: Resolved.   Acute kidney injury: Prerenal, improved.  - Push po intake, avoid nephrotoxins. Unclear whether baseline creatinine is normal or elevated. Refused lab draw today, will recheck tmrw AM (after discussing in depth with the patient need for this and likelihood that it will be performed by a phlebotomist she may not have met yet).  Constipation:  - Add miralax, abd has been  benign.  DVT prophylaxis: Lovenox Code Status: Full Family Communication: None at bedside Disposition Plan:  Status is: Inpatient  Remains inpatient appropriate because:Unsafe d/c plan. Patient requires new-start insulin with education and training as well as dose adjustments. This will reasonably cross 2 midnights.  Dispo: The patient is from: Home              Anticipated d/c is to: Home              Anticipated d/c date is: 2 days              Patient currently is not medically stable to d/c.  Consultants:   Diabetes coordinator  Procedures:   None  Antimicrobials:  None   Subjective: Was wary of phlebotomist last night who she had never seen before. They tried to get an interpretor but none was available so she refused blood draw. She's feeling ok today, just hasn't had BM yet. [place] interpretor used throughout encounter today.  Objective: Vitals:   01/15/20 1632 01/15/20 1912 01/15/20 2309 01/16/20 0407  BP: 128/67 (!) 125/57 117/62 139/61  Pulse: 80 84 81 82  Resp: '20 18 13 17  '$ Temp: 98.3 F (36.8 C) 98.2 F (36.8 C) 98.2 F (36.8 C) 98.4 F (36.9 C)  TempSrc: Oral Oral Oral Oral  SpO2: 99% 99% 97% 97%  Weight:      Height:        Intake/Output Summary (Last 24 hours) at 01/16/2020 1046 Last data filed at 01/16/2020 0407 Gross per 24 hour  Intake 700 ml  Output -  Net 700 ml   Filed Weights   01/14/20 2159  Weight: 55.4 kg   Gen: 86 y.o.  female in no distress Pulm: Nonlabored breathing room air. Clear. CV: Regular rate and rhythm. No murmur, rub, or gallop. No JVD, no dependent edema. GI: Abdomen soft, non-tender, non-distended, with normoactive bowel sounds.  Ext: Warm, no deformities Skin: No rashes, lesions or ulcers on visualized skin. Neuro: Alert and oriented. No focal neurological deficits. Psych: Judgement and insight appear fair. Mood euthymic & affect congruent. Behavior is appropriate.    Data Reviewed: I have personally  reviewed following labs and imaging studies  CBC: Recent Labs  Lab 01/14/20 1131 01/14/20 1512 01/14/20 1811  WBC 6.9  --  8.7  HGB 12.8 13.9 12.7  HCT 41.4 41.0 39.8  MCV 92.4  --  90.5  PLT 333  --  503   Basic Metabolic Panel: Recent Labs  Lab 01/14/20 1131 01/14/20 1131 01/14/20 1512 01/14/20 1811 01/14/20 2027 01/15/20 0048 01/15/20 0442  NA 128*   < > 131* 138 143 141 141  K 5.8*   < > 7.3* 3.7 3.6 4.7 4.0  CL 87*  --   --  98 104 104 105  CO2 29  --   --  '27 29 31 31  '$ GLUCOSE 941*  --   --  274* 90 147* 141*  BUN 32*  --   --  25* '22 22 20  '$ CREATININE 1.32*  --   --  1.05* 0.92 0.85 0.84  CALCIUM 9.1  --   --  9.1 9.2 8.8* 8.9   < > = values in this interval not displayed.   GFR: Estimated Creatinine Clearance: 37.9 mL/min (by C-G formula based on SCr of 0.84 mg/dL). Liver Function Tests: Recent Labs  Lab 01/14/20 1811  AST 21  ALT 13  ALKPHOS 71  BILITOT 0.6  PROT 7.5  ALBUMIN 3.3*   Recent Labs  Lab 01/14/20 1811  LIPASE 26   No results for input(s): AMMONIA in the last 168 hours. Coagulation Profile: No results for input(s): INR, PROTIME in the last 168 hours. Cardiac Enzymes: No results for input(s): CKTOTAL, CKMB, CKMBINDEX, TROPONINI in the last 168 hours. BNP (last 3 results) No results for input(s): PROBNP in the last 8760 hours. HbA1C: Recent Labs    01/14/20 1811  HGBA1C 13.3*   CBG: Recent Labs  Lab 01/15/20 2001 01/15/20 2356 01/16/20 0022 01/16/20 0403 01/16/20 0810  GLUCAP 352* 64* 95 119* 154*   Lipid Profile: No results for input(s): CHOL, HDL, LDLCALC, TRIG, CHOLHDL, LDLDIRECT in the last 72 hours. Thyroid Function Tests: No results for input(s): TSH, T4TOTAL, FREET4, T3FREE, THYROIDAB in the last 72 hours. Anemia Panel: No results for input(s): VITAMINB12, FOLATE, FERRITIN, TIBC, IRON, RETICCTPCT in the last 72 hours. Urine analysis:    Component Value Date/Time   COLORURINE STRAW (A) 01/14/2020 1126    [place] 01/14/2020 1126   LABSPEC 1.025 01/14/2020 1126   PHURINE 5.0 01/14/2020 1126   GLUCOSEU >=500 (A) 01/14/2020 1126   HGBUR NEGATIVE 01/14/2020 1126   [place] 01/14/2020 [place] 01/14/2020 [place] 01/14/2020 1126   NITRITE NEGATIVE 01/14/2020 [place] 01/14/2020 1126   Recent Results (from the past 240 hour(s))  SARS Coronavirus 2 by RT PCR (hospital order, performed in [facility] hospital lab) Nasopharyngeal Nasopharyngeal Swab     Status: None   Collection Time: 01/14/20  4:49 PM   Specimen: Nasopharyngeal Swab  Result Value Ref Range Status   SARS Coronavirus 2 NEGATIVE NEGATIVE Final  Comment: (NOTE) SARS-CoV-2 target nucleic acids are NOT DETECTED. The SARS-CoV-2 RNA is generally detectable in upper and lower respiratory specimens during the acute phase of infection. The lowest concentration of SARS-CoV-2 viral copies this assay can detect is 250 copies / mL. A negative result does not preclude SARS-CoV-2 infection and should not be used as the sole basis for treatment or other patient management decisions.  A negative result may occur with improper specimen collection / handling, submission of specimen other than nasopharyngeal swab, presence of viral mutation(s) within the areas targeted by this assay, and inadequate number of viral copies (<250 copies / mL). A negative result must be combined with clinical observations, patient history, and epidemiological information. Fact Sheet for Patients:   StrictlyIdeas.no Fact Sheet for Healthcare Providers: BankingDealers.co.za This test is not yet approved or cleared  by the [place] FDA and has been authorized for detection and/or diagnosis of SARS-CoV-2 by FDA under an Emergency Use Authorization (EUA).  This EUA will remain in effect (meaning this test can be used) for the duration of the  COVID-19 declaration under Section 564(b)(1) of the Act, 21 U.S.C. section 360bbb-3(b)(1), unless the authorization is terminated or revoked sooner. Performed at [facility], [place] [address], [place], [place] [address]   MRSA PCR Screening     Status: Abnormal   Collection Time: 01/14/20 10:13 PM   Specimen: Nasopharyngeal  Result Value Ref Range Status   MRSA by PCR POSITIVE (A) NEGATIVE Final    Comment:        The GeneXpert MRSA Assay (FDA approved for NASAL specimens only), is one component of a comprehensive MRSA colonization surveillance program. It is not intended to diagnose MRSA infection nor to guide or monitor treatment for MRSA infections. RESULT CALLED TO, READ BACK BY AND VERIFIED WITHChauncey Cruel [facility] RN 01/15/20 0114 JDW Performed at [facility], [place] [address], [place], [place] [address]       Radiology Studies: DG Chest Portable 1 View  Result Date: 01/14/2020 CLINICAL DATA:  Altered mental status. Generalized weakness. Fatigue. EXAM: PORTABLE CHEST 1 VIEW COMPARISON:  None. FINDINGS: Normal sized heart. Tortuous aorta. Clear lungs with normal vascularity. Mild bilateral shoulder degenerative changes. IMPRESSION: No acute abnormality. Electronically Signed   By: Claudie Revering M.D.   On: 01/14/2020 14:34    Scheduled Meds: . Chlorhexidine Gluconate Cloth  6 each Topical Q0600  . enoxaparin (LOVENOX) injection  40 mg Subcutaneous Q24H  . insulin aspart  0-9 Units Subcutaneous Q4H  . insulin glargine  10 Units Subcutaneous QHS  . losartan  100 mg Oral Daily  . mupirocin ointment  1 application Nasal BID  . pantoprazole  40 mg Oral Daily  . senna-docusate  1 tablet Oral Daily   Continuous Infusions:   LOS: 2 days   Time spent: 35 minutes.  Patrecia Pour, MD [facility] [email or web address] 01/16/2020, 10:46 AM

## 2020-01-17 LAB — BASIC METABOLIC PANEL
Anion gap: 7 (ref 5–15)
BUN: 12 mg/dL (ref 8–23)
CO2: 26 mmol/L (ref 22–32)
Calcium: 8.8 mg/dL — ABNORMAL LOW (ref 8.9–10.3)
Chloride: 98 mmol/L (ref 98–111)
Creatinine, Ser: 0.84 mg/dL (ref 0.44–1.00)
GFR calc Af Amer: >60 mL/min (ref >60)
GFR calc non Af Amer: >60 mL/min (ref >60)
Glucose, Bld: 479 mg/dL — ABNORMAL HIGH (ref 70–99)
Potassium: 4.9 mmol/L (ref 3.5–5.1)
Sodium: 131 mmol/L — ABNORMAL LOW (ref 135–145)

## 2020-01-17 LAB — GLUCOSE, CAPILLARY
Glucose-Capillary: 116 mg/dL — ABNORMAL HIGH (ref 70–99)
Glucose-Capillary: 146 mg/dL — ABNORMAL HIGH (ref 70–99)
Glucose-Capillary: 310 mg/dL — ABNORMAL HIGH (ref 70–99)
Glucose-Capillary: 336 mg/dL — ABNORMAL HIGH (ref 70–99)
Glucose-Capillary: 359 mg/dL — ABNORMAL HIGH (ref 70–99)
Glucose-Capillary: 99 mg/dL (ref 70–99)

## 2020-01-17 MED ORDER — INSULIN ASPART PROT & ASPART (70-30 MIX) 100 UNIT/ML ~~LOC~~ SUSP
6.0000 [IU] | Freq: Two times a day (BID) | SUBCUTANEOUS | Status: DC
  Administered 2020-01-17 – 2020-01-18 (×2): 6 [IU] via SUBCUTANEOUS
  Filled 2020-01-17: qty 10

## 2020-01-17 MED ORDER — INSULIN ASPART 100 UNIT/ML ~~LOC~~ SOLN
0.0000 [IU] | Freq: Three times a day (TID) | SUBCUTANEOUS | Status: DC
  Administered 2020-01-17: 7 [IU] via SUBCUTANEOUS

## 2020-01-17 MED ORDER — INSULIN ASPART 100 UNIT/ML ~~LOC~~ SOLN: 0.0000 [IU] | Freq: Every day | SUBCUTANEOUS | Status: DC

## 2020-01-17 NOTE — Progress Notes (Signed)
PROGRESS NOTE  Julie Colon  QIH:[id number] DOB: June 18, 1934 DOA: 01/14/2020 PCP: In [place] Brief Narrative: Rosland Riding is an 86 y.o. female with a history of T2DM on januvia only, HTN, GERD, and asthma who recently came to [facility] to live with her daughter from [place] and presented to urgent care found to be profoundly hyperglycemic and sent to the ED. HHS was diagnosed without precipitating infection, IV fluids and insulin started, and patient admitted 5/14. Insulin has been converted to basal-bolus. HbA1c 13.3%, so the patient will require insulin at discharge. Teaching is underway.   Assessment & Plan: Principal Problem:   Hyperglycemic hyperosmolar nonketotic coma (HCC) Active Problems:   Type 2 diabetes mellitus with complication, without long-term current use of insulin (HCC)   Hyperkalemia   AKI (acute kidney injury) (HCC)  Hyperglycemic hyperosmolar nonketotic state: Pt not comatose on arrival. This is resolved.  T2DM: HbA1c 13.3% indicating inadequate control and need for insulin.  - Can continue [place], was previously on metformin as well.  - Change over to 70/30 insulin to minimize dose changes/sliding scale, and confirm that this is covered under insurance and/or affordable. CM has been helpful and will continue to assist. Follow up appointment made 6/9.  - Will continue training and monitor overnight on new 70/30 with aim to DC tmrw.  Hyperkalemia: Resolved.   Acute kidney injury: Prerenal, improved.  - Repeat BMP now  Constipation:  - Add miralax, abd has been benign.  DVT prophylaxis: Lovenox Code Status: Full Family Communication: Daughter at bedside Disposition Plan:  Status is: Inpatient  Remains inpatient appropriate because:Unsafe d/c plan.  Dispo: The patient is from: Home              Anticipated d/c is to: Home              Anticipated d/c date is: 5/18              Patient currently is not medically stable to d/c.  Consultants:   Diabetes  coordinator  Procedures:   None  Antimicrobials:  None   Subjective: Denies any complaints. Daughter at bedside used for information gathering and as interpretor per pt request. No symptoms of high or low blood sugar, eating regularly.   Objective: Vitals:   01/16/20 2321 01/17/20 0405 01/17/20 0714 01/17/20 1030  BP: (!) 147/78 128/80 (!) 156/84 (!) 144/81  Pulse: 95 82 88 (!) 107  Resp: 18 12 15 15   Temp: 97.6 F (36.4 C) 97.9 F (36.6 C) (!) 97.5 F (36.4 C) 98.3 F (36.8 C)  TempSrc: Oral Oral Oral Oral  SpO2: 100% 99% 100% 100%  Weight:      Height:       No intake or output data in the 24 hours ending 01/17/20 1322 Filed Weights   01/14/20 2159  Weight: 55.4 kg   Gen: 86 y.o. female in no distress Pulm: Nonlabored breathing room air. Clear. CV: Regular rate and rhythm. No murmur, rub, or gallop. No JVD, no dependent edema. GI: Abdomen soft, non-tender, non-distended, with normoactive bowel sounds.  Ext: Warm, no deformities Skin: No rashes, lesions or ulcers on visualized skin. Neuro: Alert and oriented. No focal neurological deficits. Psych: Judgement and insight appear fair. Mood euthymic & affect congruent. Behavior is appropriate.    Data Reviewed: I have personally reviewed following labs and imaging studies  CBC: Recent Labs  Lab 01/14/20 1131 01/14/20 1512 01/14/20 1811  WBC 6.9  --  8.7  HGB 12.8 13.9 12.7  HCT 41.4 41.0 39.8  MCV 92.4  --  90.5  PLT 333  --  829   Basic Metabolic Panel: Recent Labs  Lab 01/14/20 1131 01/14/20 1131 01/14/20 1512 01/14/20 1811 01/14/20 2027 01/15/20 0048 01/15/20 0442  NA 128*   < > 131* 138 143 141 141  K 5.8*   < > 7.3* 3.7 3.6 4.7 4.0  CL 87*  --   --  98 104 104 105  CO2 29  --   --  27 29 31 31   GLUCOSE 941*  --   --  274* 90 147* 141*  BUN 32*  --   --  25* 22 22 20   CREATININE 1.32*  --   --  1.05* 0.92 0.85 0.84  CALCIUM 9.1  --   --  9.1 9.2 8.8* 8.9   < > = values in this interval not  displayed.   GFR: Estimated Creatinine Clearance: 37.9 mL/min (by C-G formula based on SCr of 0.84 mg/dL). Liver Function Tests: Recent Labs  Lab 01/14/20 1811  AST 21  ALT 13  ALKPHOS 71  BILITOT 0.6  PROT 7.5  ALBUMIN 3.3*   Recent Labs  Lab 01/14/20 1811  LIPASE 26   No results for input(s): AMMONIA in the last 168 hours. Coagulation Profile: No results for input(s): INR, PROTIME in the last 168 hours. Cardiac Enzymes: No results for input(s): CKTOTAL, CKMB, CKMBINDEX, TROPONINI in the last 168 hours. BNP (last 3 results) No results for input(s): PROBNP in the last 8760 hours. HbA1C: Recent Labs    01/14/20 1811  HGBA1C 13.3*   CBG: Recent Labs  Lab 01/16/20 2001 01/16/20 2358 01/17/20 0407 01/17/20 0752 01/17/20 1145  GLUCAP 361* 99 116* 146* 310*   Lipid Profile: No results for input(s): CHOL, HDL, LDLCALC, TRIG, CHOLHDL, LDLDIRECT in the last 72 hours. Thyroid Function Tests: No results for input(s): TSH, T4TOTAL, FREET4, T3FREE, THYROIDAB in the last 72 hours. Anemia Panel: No results for input(s): VITAMINB12, FOLATE, FERRITIN, TIBC, IRON, RETICCTPCT in the last 72 hours. Urine analysis:    Component Value Date/Time   COLORURINE STRAW (A) 01/14/2020 1126   [place] 01/14/2020 1126   LABSPEC 1.025 01/14/2020 1126   PHURINE 5.0 01/14/2020 1126   GLUCOSEU >=500 (A) 01/14/2020 1126   HGBUR NEGATIVE 01/14/2020 1126   [place] 01/14/2020 1126   [place] 01/14/2020 1126   [place] 01/14/2020 1126   NITRITE NEGATIVE 01/14/2020 [place] 01/14/2020 1126   Recent Results (from the past 240 hour(s))  SARS Coronavirus 2 by RT PCR (hospital order, performed in [facility] hospital lab) Nasopharyngeal Nasopharyngeal Swab     Status: None   Collection Time: 01/14/20  4:49 PM   Specimen: Nasopharyngeal Swab  Result Value Ref Range Status   SARS Coronavirus 2 NEGATIVE NEGATIVE Final    Comment:  (NOTE) SARS-CoV-2 target nucleic acids are NOT DETECTED. The SARS-CoV-2 RNA is generally detectable in upper and lower respiratory specimens during the acute phase of infection. The lowest concentration of SARS-CoV-2 viral copies this assay can detect is 250 copies / mL. A negative result does not preclude SARS-CoV-2 infection and should not be used as the sole basis for treatment or other patient management decisions.  A negative result may occur with improper specimen collection / handling, submission of specimen other than nasopharyngeal swab, presence of viral mutation(s) within the areas targeted by this assay, and inadequate number of viral copies (<250 copies / mL). A  negative result must be combined with clinical observations, patient history, and epidemiological information. Fact Sheet for Patients:   BoilerBrush.com.cy Fact Sheet for Healthcare Providers: https://pope.com/ This test is not yet approved or cleared  by the [place] FDA and has been authorized for detection and/or diagnosis of SARS-CoV-2 by FDA under an Emergency Use Authorization (EUA).  This EUA will remain in effect (meaning this test can be used) for the duration of the COVID-19 declaration under Section 564(b)(1) of the Act, 21 U.S.C. section 360bbb-3(b)(1), unless the authorization is terminated or revoked sooner. Performed at [facility], [address], [place], [place] [address]   MRSA PCR Screening     Status: Abnormal   Collection Time: 01/14/20 10:13 PM   Specimen: Nasopharyngeal  Result Value Ref Range Status   MRSA by PCR POSITIVE (A) NEGATIVE Final    Comment:        The GeneXpert MRSA Assay (FDA approved for NASAL specimens only), is one component of a comprehensive MRSA colonization surveillance program. It is not intended to diagnose MRSA infection nor to guide or monitor treatment for MRSA infections. RESULT CALLED TO, READ  BACK BY AND VERIFIED WITHKathie Rhodes [facility] RN 01/15/20 0114 JDW Performed at [facility], [address] [place], [place] [address]       Radiology Studies: No results found.  Scheduled Meds: . Chlorhexidine Gluconate Cloth  6 each Topical Q0600  . enoxaparin (LOVENOX) injection  40 mg Subcutaneous Q24H  . insulin aspart protamine- aspart  6 Units Subcutaneous BID WC  . losartan  100 mg Oral Daily  . mupirocin ointment  1 application Nasal BID  . pantoprazole  40 mg Oral Daily  . polyethylene glycol  17 g Oral Daily   Continuous Infusions:   LOS: 3 days   Time spent: 35 minutes.  Tyrone Nine, MD [facility] [email or web address] 01/17/2020, 1:22 PM

## 2020-01-17 NOTE — Progress Notes (Signed)
Inpatient Diabetes Program Recommendations  AACE/ADA: New Consensus Statement on Inpatient Glycemic Control (2015)  Target Ranges:  Prepandial:   less than 140 mg/dL      Peak postprandial:   less than 180 mg/dL (1-2 hours)      Critically ill patients:  140 - 180 mg/dL   Lab Results  Component Value Date   GLUCAP 310 (H) 01/17/2020   HGBA1C 13.3 (H) 01/14/2020    Review of Glycemic Control Results for Julie Colon, Julie Colon (MRN [id number]) as of 01/17/2020 16:13  Ref. Range 01/16/2020 23:58 01/17/2020 04:07 01/17/2020 07:52 01/17/2020 11:45  Glucose-Capillary Latest Ref Range: 70 - 99 mg/dL 99 972 (H) 820 (H) 601 (H)  Diabetes history: DM2 Outpatient Diabetes medications: Januvia 100 mg daily Current orders for Inpatient glycemic control: Novolog 70/30- 6 units bid Inpatient Diabetes Program Recommendations:    Spoke with patients daughter who translated for patient regarding use of insulin pen.  We reviewed that 70/30 should be given with breakfast and evening meal- 6 units bid.  Educated patient and patient's daughter on insulin pen use at home. Reviewed all steps if insulin pen including attachment of needle, 2-unit air shot, dialing up dose, giving injection, removing needle, disposal of sharps, storage of unused insulin, disposal of insulin etc.  Patients daughter able to provide successful return demonstration.  Also reviewed troubleshooting with insulin pen.  MD to give patient Rxs for insulin pens and insulin pen needles.  Also reviewed hypoglycemia signs, symptoms and treatment.  Daughter was able to teach back and she also translated to patient.  Patient's other daughter has DM and she is familiar with "treating lows" with her.  Explained that goal blood sugars for now are 100-200 mg/dL and encouraged monitoring before each meal and at bedtime.  Explained that insulin pens can be purchased from [facility].  Gave handout on Reli-on products as well.   At d/c patient will need Rx. For 70/30  Reli-on Flexpen [phone]) and insulin pen needles [phone]).  Thanks,  Beryl Meager, RN, BC-ADM Inpatient Diabetes Coordinator Pager [phone] (8a-5p)

## 2020-01-17 NOTE — TOC Initial Note (Addendum)
Transition of Care [facility]) - Initial/Assessment Note    Patient Details  Name: Julie Colon MRN: [id number] Date of Birth: Jul 15, 1934  [facility] CM/SW Contact:    Bess Kinds, RN Phone Number: [phone] 01/17/2020, 12:23 PM  Clinical Narrative:                  Acknowledging consult for pharmacy benefits check and PCP needs. Patient has out of state Medicaid ([place]) and a managed medicare plan that covers the [place] area. Spoke with patient's daughter, Julie Colon, via telephone who advised that patient is in [place] permanently and cannot go back to [place]. Discussed contacting patient's Medicare plan about switching to a plan that covers [place] and that she may also have to apply for [place] Medicaid through DSS. Patient does not have a prescription plan that covers medication in [place], but she does have insurance and therefore does not qualify for medication assistance. Spoke with MD about about insulin through [facility]. Diabetes coordinator made recommendations for dosage for this Relion brand. Discussed medication cost with daughter who stated that this is affordable for now.   Daughter agreeable to hospital follow up appointment with [facility]. Appointment scheduled for 02/09/20 at 2:30pm - daughter aware of appointment time and the information is available on AVS.   Patient has a cane at home.   Daughter to provide transportation home when medically ready for discharge.   Patient address updated in Epic.   TOC team following for transition needs.   Expected Discharge Plan: Home/Self Care Barriers to Discharge: Continued Medical Work up   Patient Goals and CMS Choice Patient states their goals for this hospitalization and ongoing recovery are:: return home with her daughter CMS Medicare.gov Compare Post Acute Care list provided to:: Patient Represenative (must comment)(daughter, Julie Colon) Choice offered to / list presented to : NA  Expected Discharge Plan and Services Expected Discharge Plan: Home/Self  Care In-house Referral: Interpreting Services Discharge Planning Services: CM Consult Post Acute Care Choice: NA Living arrangements for the past 2 months: Single Family Home Expected Discharge Date: 01/17/20               DME Arranged: N/A DME Agency: NA       HH Arranged: NA HH Agency: NA        Prior Living Arrangements/Services Living arrangements for the past 2 months: Single Family Home Lives with:: Self, Adult Children Patient language and need for interpreter reviewed:: Yes        Need for Family Participation in Patient Care: Yes (Comment) Care giver support system in place?: Yes (comment) Current home services: DME(cane) Criminal Activity/Legal Involvement Pertinent to Current Situation/Hospitalization: No - Comment as needed  Activities of Daily Living   ADL Screening (condition at time of admission) Patient's cognitive ability adequate to safely complete daily activities?: Yes Is the patient deaf or have difficulty hearing?: No Does the patient have difficulty seeing, even when wearing glasses/contacts?: No Does the patient have difficulty concentrating, remembering, or making decisions?: No Patient able to express need for assistance with ADLs?: Yes Does the patient have difficulty dressing or bathing?: No Independently performs ADLs?: Yes (appropriate for developmental age) Does the patient have difficulty walking or climbing stairs?: No Weakness of Legs: None Weakness of Arms/Hands: None  Permission Sought/Granted                  Emotional Assessment Appearance:: Appears stated age       Alcohol / Substance Use: Not Applicable Psych  Involvement: No (comment)  Admission diagnosis:  Hyperglycemic hyperosmolar nonketotic coma ([place]) [E11.01] Hyperglycemia [R73.9] Hyperosmolar hyperglycemic state (HHS) ([place]) [E11.00, E11.65] Patient Active Problem List   Diagnosis Date Noted  . Hyperglycemic hyperosmolar nonketotic coma ([place]) 01/14/2020  .  Type 2 diabetes mellitus with complication, without long-term current use of insulin ([place]) 01/14/2020  . Hyperkalemia 01/14/2020  . AKI (acute kidney injury) ([place]) 01/14/2020   PCP:  System, Pcp Not In Pharmacy:   [facility] [place], [place] - [address] [place] [address] Phone: [phone] Fax: [phone]     Social Determinants of Health (SDOH) Interventions    Readmission Risk Interventions No flowsheet data found.

## 2020-01-17 NOTE — Plan of Care (Signed)

## 2020-01-18 DIAGNOSIS — N179 Acute kidney failure, unspecified: Secondary | ICD-10-CM

## 2020-01-18 DIAGNOSIS — E875 Hyperkalemia: Secondary | ICD-10-CM

## 2020-01-18 DIAGNOSIS — E118 Type 2 diabetes mellitus with unspecified complications: Secondary | ICD-10-CM

## 2020-01-18 LAB — GLUCOSE, CAPILLARY
Glucose-Capillary: 136 mg/dL — ABNORMAL HIGH (ref 70–99)
Glucose-Capillary: 155 mg/dL — ABNORMAL HIGH (ref 70–99)
Glucose-Capillary: 216 mg/dL — ABNORMAL HIGH (ref 70–99)
Glucose-Capillary: 269 mg/dL — ABNORMAL HIGH (ref 70–99)

## 2020-01-18 MED ORDER — LOSARTAN POTASSIUM 100 MG PO TABS: 100.0000 mg | ORAL_TABLET | Freq: Every day | ORAL | 0 refills | Status: DC

## 2020-01-18 MED ORDER — NOVOLIN 70/30 FLEXPEN RELION (70-30) 100 UNIT/ML ~~LOC~~ SUPN: 6.0000 [IU] | PEN_INJECTOR | Freq: Two times a day (BID) | SUBCUTANEOUS | 0 refills | Status: DC

## 2020-01-18 MED ORDER — INSULIN PEN NEEDLE 31G X 5 MM MISC
0 refills | Status: DC
Start: 2020-01-18 — End: 2020-09-29

## 2020-01-18 NOTE — Progress Notes (Signed)
Discussed and explained discharge instructions to daugther,made aware of follow up visit, prescriptions given to daugther. Care notes printed and given on insulin administration, hemoglobin A1c, insulin pen, managing her insulin, signs for high and low blood sugar in [place]. Patient going home with daugther.

## 2020-01-18 NOTE — Discharge Instructions (Addendum)
https://[email or web address]/wp-content/uploads/2020/10/Hemoglobin-A1C-Test_French.pdf https://[email or web address]/wp-content/uploads/2020/10/Hemoglobin-A1C-Test_[place].pdf  https://clinicians.org/wp-content/uploads/2020/10/Diabetes-and-Insulin_French.pdf https://clinicians.org/wp-content/uploads/2020/10/Diabetes-and-Insulin_[place].pdf  https://clinicians.org/wp-content/uploads/2020/10/Managing-Daily-Doses-of-Insulin_French.pdf https://clinicians.org/wp-content/uploads/2020/10/Managing-Daily-Doses-of-Insulin_[place].pdf  https://clinicians.org/wp-content/uploads/2020/10/Managing-Daily-Doses-of-Insulin_French.pdf https://clinicians.org/wp-content/uploads/2020/10/Managing-Daily-Doses-of-Insulin_[place].pdf  https://clinicians.org/wp-content/uploads/2020/10/Insulin-Syringes-and-Pens_French.pdf https://clinicians.org/wp-content/uploads/2020/10/Insulin-Syringes-and-Pens_[place].pdf  https://clinicians.org/wp-content/uploads/2020/10/Injecting-Insulin_French.pdf https://clinicians.org/wp-content/uploads/2020/10/Injecting-Insulin_Cre[place].pdf  https://clinicians.org/wp-content/uploads/2020/10/Needle-Safety_French.pdf https://clinicians.org/wp-content/uploads/2020/10/Needle-Safety_[place].pdf  https://clinicians.org/wp-content/uploads/2020/10/Blood-Sugar-Too-High-or-Too-Low_French.pdf https://clinicians.org/wp-content/uploads/2020/10/Blood-Sugar-Too-High-or-Too-Low_[place].pdf  https://clinicians.org/wp-content/uploads/2020/10/Measuring-Blood-Sugar_French.pdf https://clinicians.org/wp-content/uploads/2020/10/Measuring-Blood-Sugar_[place].pdf  https://clinicians.org/wp-content/uploads/2020/10/Tracking-Your-Blood-Sugar-Results_French.pdf https://clinicians.org/wp-content/uploads/2020/10/Tracking-Your-Blood-Sugar-Results_[place].pdf  https://clinicians.org/wp-content/uploads/2020/10/Healthy-Eating-and-Exercise_French.pdf https://clinicians.org/wp-content/uploads/2020/10/Healthy-Eating-and-Exercise_[place].pdf

## 2020-01-18 NOTE — Plan of Care (Signed)
  Problem: Fluid Volume: Goal: Ability to maintain a balanced intake and output will improve Outcome: Progressing   Problem: Health Behavior/Discharge Planning: Goal: Ability to identify and utilize available resources and services will improve Outcome: Progressing Goal: Ability to manage health-related needs will improve Outcome: Progressing   Problem: Metabolic: Goal: Ability to maintain appropriate glucose levels will improve Outcome: Progressing   Problem: Nutritional: Goal: Progress toward achieving an optimal weight will improve Outcome: Progressing   Problem: Tissue Perfusion: Goal: Adequacy of tissue perfusion will improve Outcome: Progressing   Problem: Education: Goal: Knowledge of General Education information will improve Description: Including pain rating scale, medication(s)/side effects and non-pharmacologic comfort measures Outcome: Progressing   Problem: Health Behavior/Discharge Planning: Goal: Ability to manage health-related needs will improve Outcome: Progressing   Problem: Clinical Measurements: Goal: Ability to maintain clinical measurements within normal limits will improve Outcome: Progressing Goal: Will remain free from infection Outcome: Progressing Goal: Diagnostic test results will improve Outcome: Progressing Goal: Respiratory complications will improve Outcome: Progressing Goal: Cardiovascular complication will be avoided Outcome: Progressing   Problem: Activity: Goal: Risk for activity intolerance will decrease Outcome: Progressing   Problem: Nutrition: Goal: Adequate nutrition will be maintained Outcome: Progressing   Problem: Coping: Goal: Level of anxiety will decrease Outcome: Progressing   Problem: Elimination: Goal: Will not experience complications related to bowel motility Outcome: Progressing Goal: Will not experience complications related to urinary retention Outcome: Progressing   Problem: Pain Managment: Goal:  General experience of comfort will improve Outcome: Progressing   Problem: Safety: Goal: Ability to remain free from injury will improve Outcome: Progressing   Problem: Skin Integrity: Goal: Risk for impaired skin integrity will decrease Outcome: Progressing

## 2020-01-18 NOTE — Discharge Summary (Signed)
Physician Discharge Summary  Dorotea Hand ZHY:[id number] DOB: 07-30-1934 DOA: 01/14/2020  PCP: In [place], will be establishing care with Dr. Antony Blackbird  Admit date: 01/14/2020 Discharge date: 01/18/2020  Admitted From: Home Disposition: Home   Recommendations for Outpatient Follow-up:  1. Follow up/establish care with local PCP for continued diabetes management. Discharged on insulin (new start) 70/30 6 units BID after extensive teaching in the hospital.  Home Health: None Equipment/Devices: None Discharge Condition: Stable CODE STATUS: Full Diet recommendation: Carb-modified  Brief/Interim Summary: Judith Smoak is an 86 y.o. female with a history of T2DM on januvia only, HTN, GERD, and asthma who recently came to [place] to live with her daughter from [place] and presented to urgent care found to be profoundly hyperglycemic and sent to the ED. HHS was diagnosed without precipitating infection, IV fluids and insulin started, and patient admitted 5/14. HbA1c was 13.3%. Insulin was subsequently converted to 70/30 relion due to patient's insurance noncoverage at this time. Intensive instruction in diabetes management and insulin administration and dietary modifications was continued over the weekend with the patient through an interpretor and with daughter as well.   Discharge Diagnoses:  Principal Problem:   Hyperglycemic hyperosmolar nonketotic coma ([place]) Active Problems:   Type 2 diabetes mellitus with complication, without long-term current use of insulin (HCC)   Hyperkalemia   AKI (acute kidney injury) ([place])  Hyperglycemic hyperosmolar nonketotic state: Pt not comatose on arrival. This is resolved.  T2DM: HbA1c 13.3% indicating inadequate control and need for insulin.  - Can continue [place], was previously on metformin as well.  - Changed over to 70/30 insulin 6u BID to minimize dose changes/sliding scale, and confirmed that this is affordable for the patient at [facility].  -  Patient to record CBGs multiple times per day and take to appt. - Follow up appointment made 6/9.  - Dietitian consulted, provided education - Diabetes coordinator consulted, provided education, RN and coordinators provided instruction on administration of insulin.  Hyperkalemia: Resolved.   Acute kidney injury: Prerenal, improved.  - Repeat BMP at follow up  Discharge Instructions Discharge Instructions    Diet Carb Modified   Complete by: As directed    Discharge instructions   Complete by: As directed    Use relion insulin (70/30) twice daily. Take 6 units at breakfast and 6 units at dinner. Check your blood sugar first thing in the morning and with each meal, record these values, and take that record to your doctor when you follow up.  If your symptoms return, seek medical attention.   Increase activity slowly   Complete by: As directed      Allergies as of 01/18/2020   No Known Allergies     Medication List    TAKE these medications   bumetanide 1 MG tablet Commonly known as: BUMEX Take 1 mg by mouth daily.   Insulin Pen Needle 31G X 5 MM Misc Inject insulin twice daily as directed   losartan 100 MG tablet Commonly known as: COZAAR Take 100 mg by mouth daily.   NovoLIN 70/30 FlexPen Relion (70-30) 100 UNIT/ML KwikPen Generic drug: insulin isophane & regular human Inject 6 Units into the skin 2 (two) times daily.   omeprazole 20 MG capsule Commonly known as: PRILOSEC Take 20 mg by mouth 2 (two) times daily before a meal.   PROTONIX PO Take 40 mg by mouth daily.   sitaGLIPtin 100 MG tablet Commonly known as: JANUVIA Take 100 mg by mouth daily.  Follow-up Information    [facility]. Schedule an appointment as soon as possible for a visit on 02/09/2020.   Why: 2 :30 with Dr. Carlyle Dolly information: [address] [place] [address] [phone]         No Known  Allergies  Consultations:  None  Procedures/Studies: DG Chest Portable 1 View  Result Date: 01/14/2020 CLINICAL DATA:  Altered mental status. Generalized weakness. Fatigue. EXAM: PORTABLE CHEST 1 VIEW COMPARISON:  None. FINDINGS: Normal sized heart. Tortuous aorta. Clear lungs with normal vascularity. Mild bilateral shoulder degenerative changes. IMPRESSION: No acute abnormality. Electronically Signed   By: Beckie Salts M.D.   On: 01/14/2020 14:34     Subjective: [place] Creole remote audio interpretor used throughout encounter. Patient feels well, no complaints, wants to go home. Feels confident in her ability to take insulin, record blood sugars, already has plenty of supplies for CBG checks at home.   Discharge Exam: Vitals:   01/18/20 0413 01/18/20 0710  BP: (!) 139/52 134/86  Pulse:  88  Resp: 16 15  Temp: 98.3 F (36.8 C) 98.1 F (36.7 C)  SpO2: 99% 99%   General: Pt is alert, awake, not in acute distress Cardiovascular: RRR, S1/S2 +, no rubs, no gallops Respiratory: CTA bilaterally, no wheezing, no rhonchi Abdominal: Soft, NT, ND, bowel sounds + Extremities: No edema, no cyanosis  Labs: BNP (last 3 results) No results for input(s): BNP in the last 8760 hours. Basic Metabolic Panel: Recent Labs  Lab 01/14/20 1811 01/14/20 2027 01/15/20 0048 01/15/20 0442 01/17/20 1432  NA 138 143 141 141 131*  K 3.7 3.6 4.7 4.0 4.9  CL 98 104 104 105 98  CO2 27 29 31 31 26   GLUCOSE 274* 90 147* 141* 479*  BUN 25* 22 22 20 12   CREATININE 1.05* 0.92 0.85 0.84 0.84  CALCIUM 9.1 9.2 8.8* 8.9 8.8*   Liver Function Tests: Recent Labs  Lab 01/14/20 1811  AST 21  ALT 13  ALKPHOS 71  BILITOT 0.6  PROT 7.5  ALBUMIN 3.3*   Recent Labs  Lab 01/14/20 1811  LIPASE 26   No results for input(s): AMMONIA in the last 168 hours. CBC: Recent Labs  Lab 01/14/20 1131 01/14/20 1512 01/14/20 1811  WBC 6.9  --  8.7  HGB 12.8 13.9 12.7  HCT 41.4 41.0 39.8  MCV 92.4  --  90.5   PLT 333  --  335   Cardiac Enzymes: No results for input(s): CKTOTAL, CKMB, CKMBINDEX, TROPONINI in the last 168 hours. BNP: Invalid input(s): POCBNP CBG: Recent Labs  Lab 01/17/20 1620 01/17/20 1956 01/17/20 2353 01/18/20 0411 01/18/20 0604  GLUCAP 359* 336* 216* 136* 155*   D-Dimer No results for input(s): DDIMER in the last 72 hours. Hgb A1c No results for input(s): HGBA1C in the last 72 hours. Lipid Profile No results for input(s): CHOL, HDL, LDLCALC, TRIG, CHOLHDL, LDLDIRECT in the last 72 hours. Thyroid function studies No results for input(s): TSH, T4TOTAL, T3FREE, THYROIDAB in the last 72 hours.  Invalid input(s): FREET3 Anemia work up No results for input(s): VITAMINB12, FOLATE, FERRITIN, TIBC, IRON, RETICCTPCT in the last 72 hours. Urinalysis    Component Value Date/Time   COLORURINE STRAW (A) 01/14/2020 1126   APPEARANCEUR CLEAR 01/14/2020 1126   LABSPEC 1.025 01/14/2020 1126   PHURINE 5.0 01/14/2020 1126   GLUCOSEU >=500 (A) 01/14/2020 1126   HGBUR NEGATIVE 01/14/2020 1126   BILIRUBINUR NEGATIVE 01/14/2020 1126   KETONESUR  NEGATIVE 01/14/2020 1126   PROTEINUR NEGATIVE 01/14/2020 1126   NITRITE NEGATIVE 01/14/2020 1126   LEUKOCYTESUR NEGATIVE 01/14/2020 1126    Microbiology Recent Results (from the past 240 hour(s))  SARS Coronavirus 2 by RT PCR (hospital order, performed in [facility] hospital lab) Nasopharyngeal Nasopharyngeal Swab     Status: None   Collection Time: 01/14/20  4:49 PM   Specimen: Nasopharyngeal Swab  Result Value Ref Range Status   SARS Coronavirus 2 NEGATIVE NEGATIVE Final    Comment: (NOTE) SARS-CoV-2 target nucleic acids are NOT DETECTED. The SARS-CoV-2 RNA is generally detectable in upper and lower respiratory specimens during the acute phase of infection. The lowest concentration of SARS-CoV-2 viral copies this assay can detect is 250 copies / mL. A negative result does not preclude SARS-CoV-2 infection and should not be  used as the sole basis for treatment or other patient management decisions.  A negative result may occur with improper specimen collection / handling, submission of specimen other than nasopharyngeal swab, presence of viral mutation(s) within the areas targeted by this assay, and inadequate number of viral copies (<250 copies / mL). A negative result must be combined with clinical observations, patient history, and epidemiological information. Fact Sheet for Patients:   BoilerBrush.com.cy Fact Sheet for Healthcare Providers: https://pope.com/ This test is not yet approved or cleared  by the [place] FDA and has been authorized for detection and/or diagnosis of SARS-CoV-2 by FDA under an Emergency Use Authorization (EUA).  This EUA will remain in effect (meaning this test can be used) for the duration of the COVID-19 declaration under Section 564(b)(1) of the Act, 21 U.S.C. section 360bbb-3(b)(1), unless the authorization is terminated or revoked sooner. Performed at [facility], [address] [place], [place] [address]   MRSA PCR Screening     Status: Abnormal   Collection Time: 01/14/20 10:13 PM   Specimen: Nasopharyngeal  Result Value Ref Range Status   MRSA by PCR POSITIVE (A) NEGATIVE Final    Comment:        The GeneXpert MRSA Assay (FDA approved for NASAL specimens only), is one component of a comprehensive MRSA colonization surveillance program. It is not intended to diagnose MRSA infection nor to guide or monitor treatment for MRSA infections. RESULT CALLED TO, READ BACK BY AND VERIFIED WITHKathie Rhodes [facility] RN 01/15/20 0114 JDW Performed at [facility], [address], [place], [place] [address]     Time coordinating discharge: Approximately 40 minutes  Tyrone Nine, MD  [facility] 01/18/2020, 10:14 AM

## 2020-01-18 NOTE — Plan of Care (Signed)
Problem: Education: Goal: Ability to describe self-care measures that may prevent or decrease complications (Diabetes Survival Skills Education) will improve Outcome: Completed/Met Goal: Individualized Educational Video(s) Outcome: Completed/Met   Problem: Coping: Goal: Ability to adjust to condition or change in health will improve Outcome: Completed/Met   Problem: Fluid Volume: Goal: Ability to maintain a balanced intake and output will improve 01/18/2020 1033 by Don Perking, RN Outcome: Completed/Met 01/18/2020 0835 by Don Perking, RN Outcome: Progressing   Problem: Health Behavior/Discharge Planning: Goal: Ability to identify and utilize available resources and services will improve 01/18/2020 1033 by Don Perking, RN Outcome: Completed/Met 01/18/2020 0835 by Don Perking, RN Outcome: Progressing Goal: Ability to manage health-related needs will improve 01/18/2020 1033 by Don Perking, RN Outcome: Completed/Met 01/18/2020 0835 by Don Perking, RN Outcome: Progressing   Problem: Metabolic: Goal: Ability to maintain appropriate glucose levels will improve 01/18/2020 1033 by Don Perking, RN Outcome: Completed/Met 01/18/2020 0835 by Don Perking, RN Outcome: Progressing   Problem: Nutritional: Goal: Maintenance of adequate nutrition will improve Outcome: Completed/Met Goal: Progress toward achieving an optimal weight will improve 01/18/2020 1033 by Don Perking, RN Outcome: Completed/Met 01/18/2020 0835 by Don Perking, RN Outcome: Progressing   Problem: Skin Integrity: Goal: Risk for impaired skin integrity will decrease Outcome: Completed/Met   Problem: Tissue Perfusion: Goal: Adequacy of tissue perfusion will improve 01/18/2020 1033 by Don Perking, RN Outcome: Completed/Met 01/18/2020 0835 by Don Perking, RN Outcome: Progressing   Problem: Education: Goal: Knowledge of General Education  information will improve Description: Including pain rating scale, medication(s)/side effects and non-pharmacologic comfort measures 01/18/2020 1033 by Don Perking, RN Outcome: Completed/Met 01/18/2020 0835 by Don Perking, RN Outcome: Progressing   Problem: Health Behavior/Discharge Planning: Goal: Ability to manage health-related needs will improve 01/18/2020 1033 by Don Perking, RN Outcome: Completed/Met 01/18/2020 0835 by Don Perking, RN Outcome: Progressing   Problem: Clinical Measurements: Goal: Ability to maintain clinical measurements within normal limits will improve 01/18/2020 1033 by Don Perking, RN Outcome: Completed/Met 01/18/2020 0835 by Don Perking, RN Outcome: Progressing Goal: Will remain free from infection 01/18/2020 1033 by Don Perking, RN Outcome: Completed/Met 01/18/2020 0835 by Don Perking, RN Outcome: Progressing Goal: Diagnostic test results will improve 01/18/2020 1033 by Don Perking, RN Outcome: Completed/Met 01/18/2020 0835 by Don Perking, RN Outcome: Progressing Goal: Respiratory complications will improve 01/18/2020 1033 by Don Perking, RN Outcome: Completed/Met 01/18/2020 0835 by Don Perking, RN Outcome: Progressing Goal: Cardiovascular complication will be avoided 01/18/2020 1033 by Don Perking, RN Outcome: Completed/Met 01/18/2020 0835 by Don Perking, RN Outcome: Progressing   Problem: Activity: Goal: Risk for activity intolerance will decrease 01/18/2020 1033 by Don Perking, RN Outcome: Completed/Met 01/18/2020 0835 by Don Perking, RN Outcome: Progressing   Problem: Nutrition: Goal: Adequate nutrition will be maintained 01/18/2020 1033 by Don Perking, RN Outcome: Completed/Met 01/18/2020 0835 by Don Perking, RN Outcome: Progressing   Problem: Coping: Goal: Level of anxiety will decrease 01/18/2020 1033 by Don Perking, RN Outcome: Completed/Met 01/18/2020 0835 by Don Perking, RN Outcome: Progressing   Problem: Elimination: Goal: Will not experience complications related to bowel motility 01/18/2020 1033 by Don Perking, RN Outcome: Completed/Met 01/18/2020 0835 by Don Perking, RN Outcome: Progressing Goal: Will not experience complications related to urinary retention 01/18/2020 1033 by Don Perking, RN Outcome: Completed/Met 01/18/2020 [place] by Vickki Muff,  Myrtis Hopping, RN Outcome: Progressing   Problem: Pain Managment: Goal: General experience of comfort will improve 01/18/2020 1033 by Don Perking, RN Outcome: Completed/Met 01/18/2020 0835 by Don Perking, RN Outcome: Progressing   Problem: Safety: Goal: Ability to remain free from injury will improve 01/18/2020 1033 by Don Perking, RN Outcome: Completed/Met 01/18/2020 0835 by Don Perking, RN Outcome: Progressing   Problem: Skin Integrity: Goal: Risk for impaired skin integrity will decrease 01/18/2020 1033 by Don Perking, RN Outcome: Completed/Met 01/18/2020 0835 by Don Perking, RN Outcome: Progressing

## 2020-02-09 ENCOUNTER — Ambulatory Visit: Payer: Medicare (Managed Care) | Admitting: Family Medicine

## 2020-03-02 ENCOUNTER — Emergency Department (HOSPITAL_COMMUNITY): Payer: Medicare (Managed Care)

## 2020-03-02 ENCOUNTER — Other Ambulatory Visit: Payer: Self-pay

## 2020-03-02 ENCOUNTER — Encounter (HOSPITAL_COMMUNITY): Payer: Self-pay | Admitting: Emergency Medicine

## 2020-03-02 ENCOUNTER — Inpatient Hospital Stay (HOSPITAL_COMMUNITY)
Admission: EM | Admit: 2020-03-02 | Discharge: 2020-03-04 | DRG: 871 | Disposition: A | Discharge status: Home / Self Care | Payer: Medicare (Managed Care) | Attending: Internal Medicine | Admitting: Internal Medicine

## 2020-03-02 DIAGNOSIS — E118 Type 2 diabetes mellitus with unspecified complications: Secondary | ICD-10-CM | POA: Diagnosis present

## 2020-03-02 DIAGNOSIS — Z20822 Contact with and (suspected) exposure to covid-19: Secondary | ICD-10-CM | POA: Diagnosis present

## 2020-03-02 DIAGNOSIS — R54 Age-related physical debility: Secondary | ICD-10-CM

## 2020-03-02 DIAGNOSIS — K219 Gastro-esophageal reflux disease without esophagitis: Secondary | ICD-10-CM | POA: Diagnosis present

## 2020-03-02 DIAGNOSIS — I10 Essential (primary) hypertension: Secondary | ICD-10-CM | POA: Diagnosis present

## 2020-03-02 DIAGNOSIS — Z794 Long term (current) use of insulin: Secondary | ICD-10-CM | POA: Diagnosis not present

## 2020-03-02 DIAGNOSIS — Z79899 Other long term (current) drug therapy: Secondary | ICD-10-CM

## 2020-03-02 DIAGNOSIS — Z9119 Patient's noncompliance with other medical treatment and regimen: Secondary | ICD-10-CM | POA: Diagnosis not present

## 2020-03-02 DIAGNOSIS — E1165 Type 2 diabetes mellitus with hyperglycemia: Secondary | ICD-10-CM | POA: Diagnosis present

## 2020-03-02 DIAGNOSIS — Z91199 Patient's noncompliance with other medical treatment and regimen due to unspecified reason: Secondary | ICD-10-CM

## 2020-03-02 DIAGNOSIS — D72829 Elevated white blood cell count, unspecified: Secondary | ICD-10-CM

## 2020-03-02 DIAGNOSIS — J189 Pneumonia, unspecified organism: Secondary | ICD-10-CM | POA: Diagnosis present

## 2020-03-02 DIAGNOSIS — E871 Hypo-osmolality and hyponatremia: Secondary | ICD-10-CM

## 2020-03-02 DIAGNOSIS — Z833 Family history of diabetes mellitus: Secondary | ICD-10-CM

## 2020-03-02 DIAGNOSIS — A419 Sepsis, unspecified organism: Secondary | ICD-10-CM | POA: Diagnosis present

## 2020-03-02 DIAGNOSIS — R739 Hyperglycemia, unspecified: Secondary | ICD-10-CM | POA: Diagnosis not present

## 2020-03-02 LAB — URINALYSIS, ROUTINE W REFLEX MICROSCOPIC
Bacteria, UA: NONE SEEN
Bilirubin Urine: NEGATIVE
Glucose, UA: >=500 mg/dL — AB
Hgb urine dipstick: NEGATIVE
Ketones, ur: NEGATIVE mg/dL
Leukocytes,Ua: NEGATIVE
Nitrite: NEGATIVE
Protein, ur: NEGATIVE mg/dL
Specific Gravity, Urine: 1.008 (ref 1.005–1.030)
pH: 6 (ref 5.0–8.0)

## 2020-03-02 LAB — CBG MONITORING, ED: Glucose-Capillary: 224 mg/dL — ABNORMAL HIGH (ref 70–99)

## 2020-03-02 LAB — CBC
HCT: 33.9 % — ABNORMAL LOW (ref 36.0–46.0)
Hemoglobin: 10.6 g/dL — ABNORMAL LOW (ref 12.0–15.0)
MCH: 27.6 pg (ref 26.0–34.0)
MCHC: 31.3 g/dL (ref 30.0–36.0)
MCV: 88.3 fL (ref 80.0–100.0)
Platelets: 419 10*3/uL — ABNORMAL HIGH (ref 150–400)
RBC: 3.84 MIL/uL — ABNORMAL LOW (ref 3.87–5.11)
RDW: 14.7 % (ref 11.5–15.5)
WBC: 14.7 10*3/uL — ABNORMAL HIGH (ref 4.0–10.5)
nRBC: 0 % (ref 0.0–0.2)

## 2020-03-02 LAB — COMPREHENSIVE METABOLIC PANEL
ALT: 11 U/L (ref 0–44)
AST: 17 U/L (ref 15–41)
Albumin: 3.1 g/dL — ABNORMAL LOW (ref 3.5–5.0)
Alkaline Phosphatase: 59 U/L (ref 38–126)
Anion gap: 8 (ref 5–15)
BUN: 12 mg/dL (ref 8–23)
CO2: 26 mmol/L (ref 22–32)
Calcium: 8.8 mg/dL — ABNORMAL LOW (ref 8.9–10.3)
Chloride: 95 mmol/L — ABNORMAL LOW (ref 98–111)
Creatinine, Ser: 0.85 mg/dL (ref 0.44–1.00)
GFR calc Af Amer: >60 mL/min (ref >60)
GFR calc non Af Amer: >60 mL/min (ref >60)
Glucose, Bld: 480 mg/dL — ABNORMAL HIGH (ref 70–99)
Potassium: 4.5 mmol/L (ref 3.5–5.1)
Sodium: 129 mmol/L — ABNORMAL LOW (ref 135–145)
Total Bilirubin: 0.6 mg/dL (ref 0.3–1.2)
Total Protein: 7.1 g/dL (ref 6.5–8.1)

## 2020-03-02 LAB — TROPONIN I (HIGH SENSITIVITY): Troponin I (High Sensitivity): 8 ng/L (ref <18)

## 2020-03-02 LAB — LACTIC ACID, PLASMA: Lactic Acid, Venous: 1.2 mmol/L (ref 0.5–1.9)

## 2020-03-02 LAB — LIPASE, BLOOD: Lipase: 24 U/L (ref 11–51)

## 2020-03-02 LAB — SARS CORONAVIRUS 2 BY RT PCR (HOSPITAL ORDER, PERFORMED IN ~~LOC~~ HOSPITAL LAB): SARS Coronavirus 2: NEGATIVE

## 2020-03-02 MED ORDER — BUMETANIDE 1 MG PO TABS
1.0000 mg | ORAL_TABLET | Freq: Every day | ORAL | Status: DC
  Administered 2020-03-02 – 2020-03-04 (×3): 1 mg via ORAL
  Filled 2020-03-02 (×3): qty 1

## 2020-03-02 MED ORDER — ONDANSETRON HCL 4 MG PO TABS: 4.0000 mg | ORAL_TABLET | Freq: Four times a day (QID) | ORAL | Status: DC | PRN

## 2020-03-02 MED ORDER — INSULIN ASPART 100 UNIT/ML ~~LOC~~ SOLN
0.0000 [IU] | Freq: Three times a day (TID) | SUBCUTANEOUS | Status: DC
  Administered 2020-03-03: 2 [IU] via SUBCUTANEOUS
  Administered 2020-03-03 (×2): 5 [IU] via SUBCUTANEOUS
  Administered 2020-03-04: 3 [IU] via SUBCUTANEOUS

## 2020-03-02 MED ORDER — AZITHROMYCIN 250 MG PO TABS
500.0000 mg | ORAL_TABLET | Freq: Once | ORAL | Status: AC
  Administered 2020-03-02: 500 mg via ORAL
  Filled 2020-03-02: qty 2

## 2020-03-02 MED ORDER — SODIUM CHLORIDE 0.9 % IV SOLN
500.0000 mg | INTRAVENOUS | Status: DC
  Administered 2020-03-03: 500 mg via INTRAVENOUS
  Filled 2020-03-02: qty 500

## 2020-03-02 MED ORDER — ACETAMINOPHEN 325 MG PO TABS
650.0000 mg | ORAL_TABLET | Freq: Four times a day (QID) | ORAL | Status: DC | PRN
  Administered 2020-03-03 – 2020-03-04 (×3): 650 mg via ORAL
  Filled 2020-03-02 (×3): qty 2

## 2020-03-02 MED ORDER — INSULIN ASPART PROT & ASPART (70-30 MIX) 100 UNIT/ML ~~LOC~~ SUSP
6.0000 [IU] | Freq: Two times a day (BID) | SUBCUTANEOUS | Status: DC
  Administered 2020-03-02 – 2020-03-04 (×4): 6 [IU] via SUBCUTANEOUS
  Filled 2020-03-02: qty 10

## 2020-03-02 MED ORDER — SODIUM CHLORIDE 0.9 % IV SOLN
1.0000 g | Freq: Once | INTRAVENOUS | Status: AC
  Administered 2020-03-02: 1 g via INTRAVENOUS
  Filled 2020-03-02: qty 10

## 2020-03-02 MED ORDER — ENOXAPARIN SODIUM 40 MG/0.4ML ~~LOC~~ SOLN
40.0000 mg | SUBCUTANEOUS | Status: DC
  Administered 2020-03-02 – 2020-03-03 (×2): 40 mg via SUBCUTANEOUS
  Filled 2020-03-02 (×2): qty 0.4

## 2020-03-02 MED ORDER — INSULIN ASPART 100 UNIT/ML ~~LOC~~ SOLN
0.0000 [IU] | Freq: Every day | SUBCUTANEOUS | Status: DC
  Administered 2020-03-02: 2 [IU] via SUBCUTANEOUS
  Administered 2020-03-03: 3 [IU] via SUBCUTANEOUS

## 2020-03-02 MED ORDER — ONDANSETRON HCL 4 MG/2ML IJ SOLN: 4.0000 mg | Freq: Four times a day (QID) | INTRAMUSCULAR | Status: DC | PRN

## 2020-03-02 MED ORDER — SODIUM CHLORIDE 0.9 % IV SOLN
2.0000 g | INTRAVENOUS | Status: DC
  Administered 2020-03-03: 2 g via INTRAVENOUS
  Filled 2020-03-02: qty 20

## 2020-03-02 MED ORDER — ACETAMINOPHEN 650 MG RE SUPP: 650.0000 mg | Freq: Four times a day (QID) | RECTAL | Status: DC | PRN

## 2020-03-02 MED ORDER — ACETAMINOPHEN 500 MG PO TABS
1000.0000 mg | ORAL_TABLET | Freq: Once | ORAL | Status: AC
  Administered 2020-03-02: 1000 mg via ORAL
  Filled 2020-03-02: qty 2

## 2020-03-02 MED ORDER — SODIUM CHLORIDE 0.9 % IV BOLUS
1000.0000 mL | Freq: Once | INTRAVENOUS | Status: AC
  Administered 2020-03-02: 1000 mL via INTRAVENOUS

## 2020-03-02 NOTE — ED Provider Notes (Signed)
[facility] EMERGENCY DEPARTMENT Provider Note   CSN: [id number] Arrival date & time: 03/02/20  1120     History Chief Complaint  Patient presents with  . Cough  . Abdominal Pain    Julie Colon is a 86 y.o. female presenting for evaluation of cough.   Level 5 caveat due to language barrier and patient's poor insight into her own health and medical problems.  History obtained with assistance of interpreter service.  Patient speaks [place].  Pt states she has been coughing for 2 wks. reports initially had sputum production, no longer. She has been using cough syrup without improvement. Unknown if she has had fevers. She reports associated CP (she reports abd pain, but per interpreter, in [place] abd pain is often used in place of CP. When pt asked to point to location of pain, she points to her R side chest). Pain is worse with coughing. Pt has associated h/o HTN, DM, GERD, asthma.  When asked if she is taking her diabetes medicines, patient appears very confused, and is only taking one of her medicines once a day.  She denies sick contacts.  She denies contact with known COVID-19 positive person.  Additional history obtained from chart review.  Patient was recently admitted to the hospital (01-14-2020) for hyperglycemia.  She recently came to [place] from [place], had not establish care.  Her hemoglobin A1c at that time was 13.3.    HPI     Past Medical History:  Diagnosis Date  . Asthma   . Diabetes mellitus without complication (HCC)   . GERD (gastroesophageal reflux disease)   . Hypertension     Patient Active Problem List   Diagnosis Date Noted  . Hyperglycemic hyperosmolar nonketotic coma (HCC) 01/14/2020  . Type 2 diabetes mellitus with complication, without long-term current use of insulin (HCC) 01/14/2020  . Hyperkalemia 01/14/2020  . AKI (acute kidney injury) (HCC) 01/14/2020    History reviewed. No pertinent surgical  history.   OB History   No obstetric history on file.     Family History  Problem Relation Age of Onset  . Diabetes Mother     Social History   Tobacco Use  . Smoking status: Never Smoker  . Smokeless tobacco: Never Used  Vaping Use  . Vaping Use: Never used  Substance Use Topics  . Alcohol use: No  . Drug use: No    Home Medications Prior to Admission medications   Medication Sig Start Date End Date Taking? Authorizing Provider  Insulin Pen Needle 31G X 5 MM MISC Inject insulin twice daily as directed 01/18/20  Yes Tyrone Nine, MD  bumetanide (BUMEX) 1 MG tablet Take 1 mg by mouth daily.    [provider]  insulin isophane & regular human (NOVOLIN 70/30 FLEXPEN RELION) (70-30) 100 UNIT/ML KwikPen Inject 6 Units into the skin 2 (two) times daily. 01/18/20   Tyrone Nine, MD  losartan (COZAAR) 100 MG tablet Take 1 tablet (100 mg total) by mouth daily. 01/18/20   Tyrone Nine, MD  omeprazole (PRILOSEC) 20 MG capsule Take 20 mg by mouth 2 (two) times daily before a meal.    [provider]  Pantoprazole Sodium (PROTONIX PO) Take 40 mg by mouth daily.     [provider]  sitaGLIPtin (JANUVIA) 100 MG tablet Take 100 mg by mouth daily.    [provider]    Allergies    Patient has no known allergies.  Review  of Systems   Review of Systems  Respiratory: Positive for cough.   Cardiovascular: Positive for chest pain.  Gastrointestinal: Positive for abdominal pain (inconsistent reports).  All other systems reviewed and are negative.   Physical Exam Updated Vital Signs BP (!) 165/80 (BP Location: Right Arm)   Pulse (!) 102   Temp 99.6 F (37.6 C) (Oral)   Resp 14   Ht 4\' 11"  (1.499 m)   Wt 72.6 kg   SpO2 97%   BMI 32.32 kg/m   Physical Exam Vitals and nursing note reviewed.  Constitutional:      General: She is not in acute distress.    Appearance: She is well-developed.     Comments: Appears nontoxic  HENT:     Head:  Normocephalic and atraumatic.  Eyes:     Conjunctiva/sclera: Conjunctivae normal.     Pupils: Pupils are equal, round, and reactive to light.  Cardiovascular:     Rate and Rhythm: Regular rhythm. Tachycardia present.     Pulses: Normal pulses.     Comments: Mildly tachycardic 105 Pulmonary:     Effort: Pulmonary effort is normal. No respiratory distress.     Breath sounds: Wheezing and rales present.     Comments: Scattered expiratory wheezing.  Rales in bilateral lobes, worse on the right side.  Speaking full sentences.  Sats stable on room air.  No signs of respiratory distress or accessory muscle use. Abdominal:     General: There is no distension.     Palpations: Abdomen is soft. There is no mass.     Tenderness: There is no abdominal tenderness. There is no guarding or rebound.  Musculoskeletal:        General: Normal range of motion.     Cervical back: Normal range of motion and neck supple.     Right lower leg: No edema.     Left lower leg: No edema.  Skin:    General: Skin is warm and dry.     Capillary Refill: Capillary refill takes less than 2 seconds.  Neurological:     Mental Status: She is alert and oriented to person, place, and time.     ED Results / Procedures / Treatments   Labs (all labs ordered are listed, but only abnormal results are displayed) Labs Reviewed  COMPREHENSIVE METABOLIC PANEL - Abnormal; Notable for the following components:      Result Value   Sodium 129 (*)    Chloride 95 (*)    Glucose, Bld 480 (*)    Calcium 8.8 (*)    Albumin 3.1 (*)    All other components within normal limits  CBC - Abnormal; Notable for the following components:   WBC 14.7 (*)    RBC 3.84 (*)    Hemoglobin 10.6 (*)    HCT 33.9 (*)    Platelets 419 (*)    All other components within normal limits  SARS CORONAVIRUS 2 BY RT PCR (HOSPITAL ORDER, PERFORMED IN [facility])  CULTURE, BLOOD (ROUTINE X 2)  CULTURE, BLOOD (ROUTINE X 2)  URINE CULTURE   LIPASE, BLOOD  LACTIC ACID, PLASMA  URINALYSIS, ROUTINE W REFLEX MICROSCOPIC  LACTIC ACID, PLASMA  TROPONIN I (HIGH SENSITIVITY)    EKG None  Radiology DG Chest 2 View  Result Date: 03/02/2020 CLINICAL DATA:  Provided history: Cough, COPD EXAM: CHEST - 2 VIEW COMPARISON:  Prior chest radiographs 01/14/2020 FINDINGS: Heart size within normal limits. There is increased density in the region  of the right middle lobe. Additionally, there is slight indistinctness of the right heart border on PA radiograph. The left lung is clear. A trace right pleural effusion is questioned. No evidence of pneumothorax. No acute bony abnormality. IMPRESSION: Findings suspicious for right middle lobe pneumonia. Radiographic follow-up following treatment is recommended to ensure resolution and exclude alternative etiologies. Possible trace right pleural effusion. Electronically Signed   By: Jackey Loge DO   On: 03/02/2020 12:27    Procedures Procedures (including critical care time)  Medications Ordered in ED Medications  cefTRIAXone (ROCEPHIN) 1 g in sodium chloride 0.9 % 100 mL IVPB (has no administration in time range)  sodium chloride 0.9 % bolus 1,000 mL (has no administration in time range)  azithromycin (ZITHROMAX) tablet 500 mg (500 mg Oral Given 03/02/20 1545)  acetaminophen (TYLENOL) tablet 1,000 mg (1,000 mg Oral Given 03/02/20 1544)    ED Course  I have reviewed the triage vital signs and the nursing notes.  Pertinent labs & imaging results that were available during my care of the patient were reviewed by me and considered in my medical decision making (see chart for details).    MDM Rules/Calculators/A&P                 PORT Score: 114         Patient presenting for evaluation of 2-week history of cough, chest pain, abdominal pain.  On exam, patient appears nontoxic.  She does have wheezing and rales on exam.  X-ray viewed interpreted by me, consistent with pneumonia with a possible  right-sided pleural effusion.  Labs show mild leukocytosis at 14.  CBG elevated at 480, patient not in DKA.  Patient remains mildly tachycardic in the low 100s.  Per report score, patient with moderate risk risk, and hospitalization is recommended.  Considering her age and history of poorly controlled diabetes, I believe hospitalization would be beneficial for her.  Discussed with patient, who is agreeable.  Will call for admission.  Discussed with Dr. Rhona Leavens [facility], pt to be admitted.   Final Clinical Impression(s) / ED Diagnoses Final diagnoses:  Community acquired pneumonia of right middle lobe of lung  Hyperglycemia    Rx / DC Orders ED Discharge Orders    None       Alveria Apley, PA-C 03/02/20 1624    Ward, Layla Maw, DO 03/02/20 1633

## 2020-03-02 NOTE — ED Notes (Signed)
Pt refusing all lab draws, unable to obtain blood from existing IV

## 2020-03-02 NOTE — H&P (Signed)
History and Physical    Eliot Bencivenga VCB:[id number] DOB: 1934-04-06 DOA: 03/02/2020  PCP: System, Pcp Not In  Patient coming from: Home  Chief Complaint: Cough  HPI: Julie Colon is a 86 y.o. female with medical history significant of poorly controlled DM recently admitted for hyperosmolar state discharged on insulin, GERD, asthma, HTN who presents with increased cough and lower chest pains. Pt speaks french [place] and hx was attempted via interpreter. Pt states symptoms started 2 weeks prior. Pt lives at home with daughter and caregiver. No sick contacts. Pt did report receiving COVID vaccine "when it first came out." Reports cough with subjective sob and diffuse chest pains only when coughing. No abd pain, nausea, joint pains.   ED Course: In the ED, pt was found to have random glucose of 480, sodium 129, WBC of over 14k. CXR performed and reviewed with findings of R middle lobe PNA. Patient was started on empiric azithromycin and rocephin. Hospitalist consulted for consideration for medical admission.  Review of Systems:  Review of Systems  Constitutional: Negative for chills and weight loss.  HENT: Negative for ear discharge, ear pain and tinnitus.   Eyes: Negative for double vision, photophobia and pain.  Respiratory: Positive for cough and shortness of breath.   Cardiovascular: Positive for chest pain. Negative for claudication and leg swelling.  Gastrointestinal: Negative for abdominal pain, nausea and vomiting.  Genitourinary: Negative for frequency and hematuria.  Musculoskeletal: Negative for back pain, falls and neck pain.  Neurological: Negative for tingling, tremors, seizures, loss of consciousness and weakness.  Psychiatric/Behavioral: Negative for hallucinations. The patient is not nervous/anxious and does not have insomnia.     Past Medical History:  Diagnosis Date  . Asthma   . Diabetes mellitus without complication (HCC)   . GERD (gastroesophageal reflux disease)     . Hypertension     History reviewed. No pertinent surgical history.   reports that she has never smoked. She has never used smokeless tobacco. She reports that she does not drink alcohol and does not use drugs.  No Known Allergies  Family History  Problem Relation Age of Onset  . Diabetes Mother     Prior to Admission medications   Medication Sig Start Date End Date Taking? Authorizing Provider  insulin isophane & regular human (NOVOLIN 70/30 FLEXPEN RELION) (70-30) 100 UNIT/ML KwikPen Inject 6 Units into the skin 2 (two) times daily. 01/18/20  Yes Tyrone Nine, MD  Insulin Pen Needle 31G X 5 MM MISC Inject insulin twice daily as directed 01/18/20  Yes Tyrone Nine, MD  sitaGLIPtin (JANUVIA) 100 MG tablet Take 100 mg by mouth daily.   Yes [provider]  bumetanide (BUMEX) 1 MG tablet Take 1 mg by mouth daily.    [provider]  losartan (COZAAR) 100 MG tablet Take 1 tablet (100 mg total) by mouth daily. 01/18/20   Tyrone Nine, MD  omeprazole (PRILOSEC) 20 MG capsule Take 20 mg by mouth 2 (two) times daily before a meal. Patient not taking: Reported on 03/02/2020    [provider]  Pantoprazole Sodium (PROTONIX PO) Take 40 mg by mouth daily.  Patient not taking: Reported on 03/02/2020    [provider]    Physical Exam: Vitals:   03/02/20 1126 03/02/20 1350  BP: (!) 180/146 (!) 165/80  Pulse: (!) 59 (!) 102  Resp: 14 14  Temp: 99.6 F (37.6 C)   TempSrc: Oral   SpO2: 95% 97%  Weight: 72.6 kg  Height: 4\' 11"  (1.499 m)     Constitutional: NAD, calm, comfortable  Vitals:   03/02/20 1126 03/02/20 1350  BP: (!) 180/146 (!) 165/80  Pulse: (!) 59 (!) 102  Resp: 14 14  Temp: 99.6 F (37.6 C)   TempSrc: Oral   SpO2: 95% 97%  Weight: 72.6 kg   Height: 4\' 11"  (1.499 m)    Eyes: PERRL, lids and conjunctivae normal ENMT: Mucous membranes are moist. Posterior pharynx clear of any exudate or lesions.Normal dentition.  Neck: normal,  supple, no masses, no thyromegaly Respiratory: clear to auscultation bilaterally, no wheezing, no crackles. Normal respiratory effort. Actively coughing Cardiovascular: Regular rate and rhythm, regular Abdomen: no tenderness, no masses palpated. Pos bs Musculoskeletal: no clubbing / cyanosis. No joint deformity upper and lower extremities. Good ROM, no contractures. Normal muscle tone.  Skin: no rashes, lesions, ulcers. Neurologic: CN 2-12 grossly intact. Sensation intact, no tremors Psychiatric: Normal judgment and insight. Alert and oriented x 3. Normal mood.    Labs on Admission: I have personally reviewed following labs and imaging studies  CBC: Recent Labs  Lab 03/02/20 1131  WBC 14.7*  HGB 10.6*  HCT 33.9*  MCV 88.3  PLT 419*   Basic Metabolic Panel: Recent Labs  Lab 03/02/20 1131  NA 129*  K 4.5  CL 95*  CO2 26  GLUCOSE 480*  BUN 12  CREATININE 0.85  CALCIUM 8.8*   GFR: Estimated Creatinine Clearance: 42.8 mL/min (by C-G formula based on SCr of 0.85 mg/dL). Liver Function Tests: Recent Labs  Lab 03/02/20 1131  AST 17  ALT 11  ALKPHOS 59  BILITOT 0.6  PROT 7.1  ALBUMIN 3.1*   Recent Labs  Lab 03/02/20 1131  LIPASE 24   No results for input(s): AMMONIA in the last 168 hours. Coagulation Profile: No results for input(s): INR, PROTIME in the last 168 hours. Cardiac Enzymes: No results for input(s): CKTOTAL, CKMB, CKMBINDEX, TROPONINI in the last 168 hours. BNP (last 3 results) No results for input(s): PROBNP in the last 8760 hours. HbA1C: No results for input(s): HGBA1C in the last 72 hours. CBG: No results for input(s): GLUCAP in the last 168 hours. Lipid Profile: No results for input(s): CHOL, HDL, LDLCALC, TRIG, CHOLHDL, LDLDIRECT in the last 72 hours. Thyroid Function Tests: No results for input(s): TSH, T4TOTAL, FREET4, T3FREE, THYROIDAB in the last 72 hours. Anemia Panel: No results for input(s): VITAMINB12, FOLATE, FERRITIN, TIBC, IRON,  RETICCTPCT in the last 72 hours. Urine analysis:    Component Value Date/Time   COLORURINE STRAW (A) 03/02/2020 1600   APPEARANCEUR CLEAR 03/02/2020 1600   LABSPEC 1.008 03/02/2020 1600   PHURINE 6.0 03/02/2020 1600   GLUCOSEU >=500 (A) 03/02/2020 1600   HGBUR NEGATIVE 03/02/2020 1600   BILIRUBINUR NEGATIVE 03/02/2020 1600   KETONESUR NEGATIVE 03/02/2020 1600   PROTEINUR NEGATIVE 03/02/2020 1600   NITRITE NEGATIVE 03/02/2020 1600   LEUKOCYTESUR NEGATIVE 03/02/2020 1600   Sepsis Labs: !!!!!!!!!!!!!!!!!!!!!!!!!!!!!!!!!!!!!!!!!!!! @LABRCNTIP (procalcitonin:4,lacticidven:4) )No results found for this or any previous visit (from the past 240 hour(s)).   Radiological Exams on Admission: DG Chest 2 View  Result Date: 03/02/2020 CLINICAL DATA:  Provided history: Cough, COPD EXAM: CHEST - 2 VIEW COMPARISON:  Prior chest radiographs 01/14/2020 FINDINGS: Heart size within normal limits. There is increased density in the region of the right middle lobe. Additionally, there is slight indistinctness of the right heart border on PA radiograph. The left lung is clear. A trace right pleural effusion is questioned. No evidence of pneumothorax.  No acute bony abnormality. IMPRESSION: Findings suspicious for right middle lobe pneumonia. Radiographic follow-up following treatment is recommended to ensure resolution and exclude alternative etiologies. Possible trace right pleural effusion. Electronically Signed   By: Jackey Loge DO   On: 03/02/2020 12:27    EKG: Independently reviewed. Sinus tach, QTc 421  Assessment/Plan Principal Problem:   Sepsis due to pneumonia [facility]) Active Problems:   Type 2 diabetes mellitus with complication, without long-term current use of insulin (HCC)   Hyponatremia   Noncompliance   Leukocytosis   Advanced age   1. Sepsis secondary to PNA, CXR reviewed 1. RML PNA noted on CXR 2. Currently with leukocytosis, tachycardia. Currently afebrile 3. Azithromycin and rocephin  started in ED, will continue 4. Cultures obtained in ED, pending 2. DM2, insulin dependent with hyperglycemia 1. Pt recently admitted in 5/21 for hyperosmolar state with very poorly controlled glucose 2. Pt was discharged on 70/30 insulin, being mindful of medication cost 3. Presenting glucose well in excess of 400's 4. Will continue 70/30 insulin as recommended at last visit, cont SSI coverage 5. Recent a1c of over 13 3. Hyponatremia 1. Likely secondary to marked hyperglycemia 2. Corrected sodium of 135 4. Leukocytosis 1. Secondary to sepsis with PNA 2. Cont abx per above 3. Repeat CBC in AM 5. HTN 1. BP currently stable 2. Cont current regimen 6. Medical noncompliance 1. Suspect language barrier is main issue. Pt unable to tell me how she manages her diabetes. Pt claims to be taking insulin twice daily but cannot recall how many units she is supposed to take 2. Chart reviewed. Pt was supposed to f/u with PCP on 6/9 for hospital follow up however pt did not go and was unaware of scheduled visit despite AVS at time of discharge 3. Would benefit from review by TOC to assess for any barriers to follow up and compliance in the future  DVT prophylaxis: Lovenox subq  Code Status: Full Family Communication: Pt in room, family not at bedside  Disposition Plan:   Consults called:  Admission status: Inpatient as would likely require 2 midnight stay for continued IV abx to treat sepsis   Rickey Barbara MD [facility] Pager On Amion  If 7PM-7AM, please contact night-coverage  03/02/2020, 4:35 PM

## 2020-03-02 NOTE — ED Provider Notes (Signed)
MSE was initiated and I personally evaluated the patient and placed orders (if any) at  3:03 PM on March 02, 2020.  The patient appears stable so that the remainder of the MSE may be completed by another provider.  Patient is 86 year old female with past medical history of uncontrolled diabetes presented today with cough for 2 weeks.  Patient is an unvaccinated 86 year old female with past medical history of uncontrolled DM 2 who presented today with cough for 2 weeks.  She endorses chest pain with coughing but no other time.  She states that she takes her blood sugar medications however she is not sure what she takes she states that she takes 1 injection in the morning but was told to take it morning and night.  She denies any nausea vomiting or diarrhea.  She endorses some abdominal pain which seems to be lower abdominal however she also points to the right side of her chest wall.  Patient does appear to have pneumonia obtain blood cultures, lactic, troponin, urinalysis and Covid PCR.  Will provide patient with empiric antibiotics with Rocephin and azithromycin.   She does have leukocytosis and is mildly tachycardic   NEURO:  Alert and oriented x 3, no focal deficits EYES:  pupils equal and reactive ENT/NECK:  trachea midline, no JVD CARDIO:   Heart rate mildly tachycardic, reg rhythm, well-perfused PULM:  None labored breathing GI/GU:  Abdomen non-distended MSK/SPINE:  No gross deformities, no edema SKIN:  no rash obvious, atraumatic, no ecchymosis  PSYCH:  Appropriate speech and behavior   Will leave remainder of work-up to oncoming PA Sophia C.     Solon Augusta [place], [place] 03/02/20 1542    Blane Ohara, MD 03/02/20 1559

## 2020-03-02 NOTE — ED Triage Notes (Signed)
Pt is with daughter who reports pt has had a cough for 2.5 weeks. Denies any other symptoms. Hx of DM and COPD. Reports some abd pain from coughing.

## 2020-03-03 LAB — COMPREHENSIVE METABOLIC PANEL
ALT: 11 U/L (ref 0–44)
AST: 18 U/L (ref 15–41)
Albumin: 3.3 g/dL — ABNORMAL LOW (ref 3.5–5.0)
Alkaline Phosphatase: 58 U/L (ref 38–126)
Anion gap: 13 (ref 5–15)
BUN: 10 mg/dL (ref 8–23)
CO2: 24 mmol/L (ref 22–32)
Calcium: 9 mg/dL (ref 8.9–10.3)
Chloride: 100 mmol/L (ref 98–111)
Creatinine, Ser: 0.69 mg/dL (ref 0.44–1.00)
GFR calc Af Amer: >60 mL/min (ref >60)
GFR calc non Af Amer: >60 mL/min (ref >60)
Glucose, Bld: 163 mg/dL — ABNORMAL HIGH (ref 70–99)
Potassium: 3.8 mmol/L (ref 3.5–5.1)
Sodium: 137 mmol/L (ref 135–145)
Total Bilirubin: 0.7 mg/dL (ref 0.3–1.2)
Total Protein: 7.5 g/dL (ref 6.5–8.1)

## 2020-03-03 LAB — CBC
HCT: 34.4 % — ABNORMAL LOW (ref 36.0–46.0)
Hemoglobin: 10.8 g/dL — ABNORMAL LOW (ref 12.0–15.0)
MCH: 27.1 pg (ref 26.0–34.0)
MCHC: 31.4 g/dL (ref 30.0–36.0)
MCV: 86.4 fL (ref 80.0–100.0)
Platelets: 399 10*3/uL (ref 150–400)
RBC: 3.98 MIL/uL (ref 3.87–5.11)
RDW: 14.7 % (ref 11.5–15.5)
WBC: 13.4 10*3/uL — ABNORMAL HIGH (ref 4.0–10.5)
nRBC: 0 % (ref 0.0–0.2)

## 2020-03-03 LAB — GLUCOSE, CAPILLARY
Glucose-Capillary: 208 mg/dL — ABNORMAL HIGH (ref 70–99)
Glucose-Capillary: 246 mg/dL — ABNORMAL HIGH (ref 70–99)
Glucose-Capillary: 125 mg/dL — ABNORMAL HIGH (ref 70–99)
Glucose-Capillary: 281 mg/dL — ABNORMAL HIGH (ref 70–99)

## 2020-03-03 LAB — HIV ANTIBODY (ROUTINE TESTING W REFLEX): HIV Screen 4th Generation wRfx: NONREACTIVE

## 2020-03-03 LAB — TROPONIN I (HIGH SENSITIVITY): Troponin I (High Sensitivity): 10 ng/L (ref <18)

## 2020-03-03 LAB — LACTIC ACID, PLASMA: Lactic Acid, Venous: 1.4 mmol/L (ref 0.5–1.9)

## 2020-03-03 MED ORDER — BENZONATATE 100 MG PO CAPS
100.0000 mg | ORAL_CAPSULE | Freq: Three times a day (TID) | ORAL | Status: DC | PRN
  Administered 2020-03-03 (×2): 100 mg via ORAL
  Filled 2020-03-03 (×2): qty 1

## 2020-03-03 MED ORDER — LOSARTAN POTASSIUM 50 MG PO TABS
100.0000 mg | ORAL_TABLET | Freq: Every day | ORAL | Status: DC
  Administered 2020-03-03 – 2020-03-04 (×2): 100 mg via ORAL
  Filled 2020-03-03 (×2): qty 2

## 2020-03-03 MED ORDER — GUAIFENESIN ER 600 MG PO TB12
600.0000 mg | ORAL_TABLET | Freq: Two times a day (BID) | ORAL | Status: DC
  Administered 2020-03-03 – 2020-03-04 (×3): 600 mg via ORAL
  Filled 2020-03-03 (×3): qty 1

## 2020-03-03 MED ORDER — CEFDINIR 300 MG PO CAPS
300.0000 mg | ORAL_CAPSULE | Freq: Two times a day (BID) | ORAL | Status: DC
  Administered 2020-03-03 – 2020-03-04 (×2): 300 mg via ORAL
  Filled 2020-03-03 (×3): qty 1

## 2020-03-03 MED ORDER — GUAIFENESIN-DM 100-10 MG/5ML PO SYRP
5.0000 mL | ORAL_SOLUTION | ORAL | Status: DC | PRN
  Administered 2020-03-03 – 2020-03-04 (×4): 5 mL via ORAL
  Filled 2020-03-03 (×4): qty 5

## 2020-03-03 MED ORDER — AZITHROMYCIN 250 MG PO TABS
250.0000 mg | ORAL_TABLET | Freq: Every day | ORAL | Status: DC
  Administered 2020-03-04: 250 mg via ORAL
  Filled 2020-03-03: qty 1

## 2020-03-03 MED ORDER — LABETALOL HCL 5 MG/ML IV SOLN: 5.0000 mg | INTRAVENOUS | Status: DC | PRN

## 2020-03-03 NOTE — Progress Notes (Signed)
Inpatient Diabetes Program Recommendations  AACE/ADA: New Consensus Statement on Inpatient Glycemic Control (2015)  Target Ranges:  Prepandial:   less than 140 mg/dL      Peak postprandial:   less than 180 mg/dL (1-2 hours)      Critically ill patients:  140 - 180 mg/dL   Lab Results  Component Value Date   GLUCAP 208 (H) 03/03/2020   HGBA1C 13.3 (H) 01/14/2020    Review of Glycemic Control Results for Julie Colon, Julie Colon (MRN [id number]) as of 03/03/2020 10:24  Ref. Range 01/18/2020 04:11 01/18/2020 06:04 01/18/2020 10:36 03/02/2020 22:39 03/03/2020 08:27  Glucose-Capillary Latest Ref Range: 70 - 99 mg/dL 798 (H) 921 (H) 194 (H) 224 (H) 208 (H)   Diabetes history:  DM2 Outpatient Diabetes medications:  70/30 6 units bid januvia 100 mg daily (does not have prescription) Current orders for Inpatient glycemic control:  70/30 6 units bid novolog 0-15 units tid   Note:  Spoke with patient at bedside and followed up with daughter [name], via phone.  Patient takes 70/30 6 units bid at home but has not been able to get the Januvia filled as she is uninsured.  Average cost without insurance is $300.00.  Read in previous hospitalization TOC note that she has [employer] and does not qualify for medication assistance here.  Will place another [facility] consult.  If patient is unable to get medication assistance would recommend starting Metformin 500 mg BID at discharge as it is $4 at [employer].    Checks cbg's qam.  Encouraged her to check at bedtime as well and bring meter to PCP visit for review.  She injects her insulin in her stomach.  Asked her to rotate sites to avoid scar tissue.  Denies hypoglycemia; reviewed symptoms and treatment.  She has plenty of insulin and pen needles a home.    She states she has improved her diet and is limiting CHO's and cutting back on sweets.   She has an appointment at [facility] on July 9th.  Attached education on diabetes to AVS.    Will continue to follow while  inpatient.  Thank you, Dulce Sellar, RN, BSN Diabetes Coordinator Inpatient Diabetes Program [phone] (team pager from 8a-5p)

## 2020-03-03 NOTE — ED Notes (Signed)
Unit report attempted x 3 no success, pt taken to the unit and bedside report given. Pt AO x4 on ED departure and unit arrival, NAD noticed.

## 2020-03-03 NOTE — ED Notes (Signed)
Report attempted. RN to call back.

## 2020-03-03 NOTE — Progress Notes (Signed)
Interpreter used to communicate the plan of care to the patient . Talked to [facility] with # [phone]. Patient verbalized understanding.

## 2020-03-03 NOTE — Progress Notes (Signed)
PROGRESS NOTE    Julie Colon  GUR:[id number] DOB: Aug 26, 1934 DOA: 03/02/2020 PCP: System, Pcp Not In    Brief Narrative:  86 y.o. female with medical history significant of poorly controlled DM recently admitted for hyperosmolar state discharged on insulin, GERD, asthma, HTN who presents with increased cough and lower chest pains. Pt speaks french [place] and hx was attempted via interpreter. Pt states symptoms started 2 weeks prior. Pt lives at home with daughter and caregiver. No sick contacts. Pt did report receiving COVID vaccine "when it first came out." Reports cough with subjective sob and diffuse chest pains only when coughing. No abd pain, nausea, joint pains.   ED Course: In the ED, pt was found to have random glucose of 480, sodium 129, WBC of over 14k. CXR performed and reviewed with findings of R middle lobe PNA. Patient was started on empiric azithromycin and rocephin. Hospitalist consulted for consideration for medical admission.  Assessment & Plan:   Principal Problem:   Sepsis due to pneumonia [facility]) Active Problems:   Type 2 diabetes mellitus with complication, without long-term current use of insulin (HCC)   Hyponatremia   Noncompliance   Leukocytosis   Advanced age   Pneumonia  1. Sepsis secondary to PNA, CXR reviewed 1. RML PNA noted on CXR 2. Currently with leukocytosis, tachycardia. Currently afebrile 3. Azithromycin and rocephin was continued 4. This AM, patient reports feeling and breathing better 5. Will transition to oral azithromycin and omnicef 2. DM2, insulin dependent with hyperglycemia 1. Pt recently admitted in 5/21 for hyperosmolar state with very poorly controlled glucose 2. Pt was discharged on 70/30 insulin, being mindful of medication cost 3. Presenting glucose well in excess of 400's 4. Will continue 70/30 insulin as recommended at last visit, cont SSI coverage 5. Recent a1c of over 13 6. Appreciate input by diabetic coordinator. Education  was provided. Recommendation for metformin 500mg  bid if unable to receive medication assistance 3. Hyponatremia 1. Likely secondary to marked hyperglycemia 2. Normalized 4. Leukocytosis 1. Secondary to sepsis with PNA 2. Cont abx per above 3. WBC is trending down 5. HTN 1. BP currently stable 2. Cont current regimen 6. Medical noncompliance 1. Suspect language barrier is main issue. Pt unable to tell me how she manages her diabetes. Pt claimed to be taking insulin twice daily but cannot recall how many units she is supposed to take 2. Chart reviewed. Pt was supposed to f/u with PCP on 6/9 for hospital follow up however pt did not go and was unaware of scheduled visit despite AVS at time of discharge 3. Would benefit from review by TOC to assess for any barriers to follow up and compliance in the future 4. Pt has appointment at [facility] on 7/9. Will remind pt. Appointment to be included in AVS  DVT prophylaxis: Lovenox subq Code Status: Full Family Communication: pt in room, family not at bedside  Status is: Inpatient  Remains inpatient appropriate because:Unsafe d/c plan and Inpatient level of care appropriate due to severity of illness   Dispo: The patient is from: Home              Anticipated d/c is to: Home              Anticipated d/c date is: 1 day              Patient currently is not medically stable to d/c.  Consultants:     Procedures:     Antimicrobials: Anti-infectives (From admission, onward)  Start     Dose/Rate Route Frequency Ordered Stop   03/03/20 1500  cefTRIAXone (ROCEPHIN) 2 g in sodium chloride 0.9 % 100 mL IVPB     Discontinue     2 g 200 mL/hr over 30 Minutes Intravenous Every 24 hours 03/02/20 1730 03/07/20 1459   03/03/20 1500  azithromycin (ZITHROMAX) 500 mg in sodium chloride 0.9 % 250 mL IVPB     Discontinue     500 mg 250 mL/hr over 60 Minutes Intravenous Every 24 hours 03/02/20 1730 03/07/20 1459   03/02/20 1500  cefTRIAXone (ROCEPHIN) 1 g  in sodium chloride 0.9 % 100 mL IVPB        1 g 200 mL/hr over 30 Minutes Intravenous  Once 03/02/20 1453 03/02/20 1614   03/02/20 1500  azithromycin (ZITHROMAX) tablet 500 mg        500 mg Oral  Once 03/02/20 1453 03/02/20 1545       Subjective: Asking about going home soon  Objective: Vitals:   03/03/20 0745 03/03/20 0747 03/03/20 1147 03/03/20 1612  BP:  (!) 192/90 (!) 141/81 (!) 166/78  Pulse: (!) 103 (!) 102 (!) 108 95  Resp:  12 14 16   Temp: 98.1 F (36.7 C) 98.1 F (36.7 C) 99.1 F (37.3 C) 98.7 F (37.1 C)  TempSrc:      SpO2: 96% 98% 100% 100%  Weight:      Height:       No intake or output data in the 24 hours ending 03/03/20 1648 Filed Weights   03/02/20 1126 03/03/20 0125  Weight: 72.6 kg 59.1 kg    Examination:  General exam: Appears calm and comfortable  Respiratory system: Clear to auscultation. Respiratory effort normal. Cardiovascular system: S1 & S2 heard, Regular Gastrointestinal system: Abdomen is nondistended, soft and nontender. No organomegaly or masses felt. Normal bowel sounds heard. Central nervous system: Alert and oriented. No focal neurological deficits. Extremities: Symmetric 5 x 5 power. Skin: No rashes, lesions  Psychiatry: Judgement and insight appear normal. Mood & affect appropriate.   Data Reviewed: I have personally reviewed following labs and imaging studies  CBC: Recent Labs  Lab 03/02/20 1131 03/03/20 0358  WBC 14.7* 13.4*  HGB 10.6* 10.8*  HCT 33.9* 34.4*  MCV 88.3 86.4  PLT 419* 399   Basic Metabolic Panel: Recent Labs  Lab 03/02/20 1131 03/03/20 0358  NA 129* 137  K 4.5 3.8  CL 95* 100  CO2 26 24  GLUCOSE 480* 163*  BUN 12 10  CREATININE 0.85 0.69  CALCIUM 8.8* 9.0   GFR: Estimated Creatinine Clearance: 41 mL/min (by C-G formula based on SCr of 0.69 mg/dL). Liver Function Tests: Recent Labs  Lab 03/02/20 1131 03/03/20 0358  AST 17 18  ALT 11 11  ALKPHOS 59 58  BILITOT 0.6 0.7  PROT 7.1 7.5    ALBUMIN 3.1* 3.3*   Recent Labs  Lab 03/02/20 1131  LIPASE 24   No results for input(s): AMMONIA in the last 168 hours. Coagulation Profile: No results for input(s): INR, PROTIME in the last 168 hours. Cardiac Enzymes: No results for input(s): CKTOTAL, CKMB, CKMBINDEX, TROPONINI in the last 168 hours. BNP (last 3 results) No results for input(s): PROBNP in the last 8760 hours. HbA1C: No results for input(s): HGBA1C in the last 72 hours. CBG: Recent Labs  Lab 03/02/20 2239 03/03/20 0827 03/03/20 1319 03/03/20 1608  GLUCAP 224* 208* 246* 125*   Lipid Profile: No results for input(s): CHOL, HDL, LDLCALC, TRIG,  CHOLHDL, LDLDIRECT in the last 72 hours. Thyroid Function Tests: No results for input(s): TSH, T4TOTAL, FREET4, T3FREE, THYROIDAB in the last 72 hours. Anemia Panel: No results for input(s): VITAMINB12, FOLATE, FERRITIN, TIBC, IRON, RETICCTPCT in the last 72 hours. Sepsis Labs: Recent Labs  Lab 03/02/20 1517 03/03/20 0358  LATICACIDVEN 1.2 1.4    Recent Results (from the past 240 hour(s))  SARS Coronavirus 2 by RT PCR (hospital order, performed in [facility] hospital lab) Nasopharyngeal Nasopharyngeal Swab     Status: None   Collection Time: 03/02/20  2:58 PM   Specimen: Nasopharyngeal Swab  Result Value Ref Range Status   SARS Coronavirus 2 NEGATIVE NEGATIVE Final    Comment: (NOTE) SARS-CoV-2 target nucleic acids are NOT DETECTED.  The SARS-CoV-2 RNA is generally detectable in upper and lower respiratory specimens during the acute phase of infection. The lowest concentration of SARS-CoV-2 viral copies this assay can detect is 250 copies / mL. A negative result does not preclude SARS-CoV-2 infection and should not be used as the sole basis for treatment or other patient management decisions.  A negative result may occur with improper specimen collection / handling, submission of specimen other than nasopharyngeal swab, presence of viral mutation(s) within  the areas targeted by this assay, and inadequate number of viral copies (<250 copies / mL). A negative result must be combined with clinical observations, patient history, and epidemiological information.  Fact Sheet for Patients:   BoilerBrush.com.cy  Fact Sheet for Healthcare Providers: https://pope.com/  This test is not yet approved or  cleared by the [place] FDA and has been authorized for detection and/or diagnosis of SARS-CoV-2 by FDA under an Emergency Use Authorization (EUA).  This EUA will remain in effect (meaning this test can be used) for the duration of the COVID-19 declaration under Section 564(b)(1) of the Act, 21 U.S.C. section 360bbb-3(b)(1), unless the authorization is terminated or revoked sooner.  Performed at [facility], [address], [place], [place] [address]   Blood culture (routine x 2)     Status: None (Preliminary result)   Collection Time: 03/02/20  3:17 PM   Specimen: BLOOD  Result Value Ref Range Status   Specimen Description BLOOD LEFT ANTECUBITAL  Final   Special Requests   Final    BOTTLES DRAWN AEROBIC AND ANAEROBIC Blood Culture results may not be optimal due to an inadequate volume of blood received in culture bottles   Culture   Final    NO GROWTH < 24 HOURS Performed at [facility], [address], [place], [place] [address]    Report Status PENDING  Incomplete     Radiology Studies: DG Chest 2 View  Result Date: 03/02/2020 CLINICAL DATA:  Provided history: Cough, COPD EXAM: CHEST - 2 VIEW COMPARISON:  Prior chest radiographs 01/14/2020 FINDINGS: Heart size within normal limits. There is increased density in the region of the right middle lobe. Additionally, there is slight indistinctness of the right heart border on PA radiograph. The left lung is clear. A trace right pleural effusion is questioned. No evidence of pneumothorax. No acute bony abnormality. IMPRESSION:  Findings suspicious for right middle lobe pneumonia. Radiographic follow-up following treatment is recommended to ensure resolution and exclude alternative etiologies. Possible trace right pleural effusion. Electronically Signed   By: Jackey Loge DO   On: 03/02/2020 12:27    Scheduled Meds: . bumetanide  1 mg Oral Daily  . enoxaparin (LOVENOX) injection  40 mg Subcutaneous Q24H  .  guaiFENesin  600 mg Oral BID  . insulin aspart  0-15 Units Subcutaneous TID WC  . insulin aspart  0-5 Units Subcutaneous QHS  . insulin aspart protamine- aspart  6 Units Subcutaneous BID  . losartan  100 mg Oral Daily   Continuous Infusions: . azithromycin 500 mg (03/03/20 1321)  . cefTRIAXone (ROCEPHIN)  IV 2 g (03/03/20 1326)     LOS: 1 day   Rickey Barbara, MD [facility] Pager On Amion  If 7PM-7AM, please contact night-coverage 03/03/2020, 4:48 PM

## 2020-03-03 NOTE — Plan of Care (Signed)
Established new care plan

## 2020-03-04 LAB — URINE CULTURE: Culture: <10000 — AB

## 2020-03-04 LAB — CBC
HCT: 33 % — ABNORMAL LOW (ref 36.0–46.0)
Hemoglobin: 10.2 g/dL — ABNORMAL LOW (ref 12.0–15.0)
MCH: 27 pg (ref 26.0–34.0)
MCHC: 30.9 g/dL (ref 30.0–36.0)
MCV: 87.3 fL (ref 80.0–100.0)
Platelets: 411 10*3/uL — ABNORMAL HIGH (ref 150–400)
RBC: 3.78 MIL/uL — ABNORMAL LOW (ref 3.87–5.11)
RDW: 14.9 % (ref 11.5–15.5)
WBC: 10 10*3/uL (ref 4.0–10.5)
nRBC: 0 % (ref 0.0–0.2)

## 2020-03-04 LAB — GLUCOSE, CAPILLARY
Glucose-Capillary: 190 mg/dL — ABNORMAL HIGH (ref 70–99)
Glucose-Capillary: 135 mg/dL — ABNORMAL HIGH (ref 70–99)

## 2020-03-04 MED ORDER — GUAIFENESIN-DM 100-10 MG/5ML PO SYRP: 5.0000 mL | ORAL_SOLUTION | ORAL | 0 refills | Status: DC | PRN

## 2020-03-04 MED ORDER — LOSARTAN POTASSIUM 100 MG PO TABS: 100.0000 mg | ORAL_TABLET | Freq: Every day | ORAL | 0 refills | Status: DC

## 2020-03-04 MED ORDER — AZITHROMYCIN 250 MG PO TABS: 250.0000 mg | ORAL_TABLET | Freq: Every day | ORAL | 0 refills | Status: AC

## 2020-03-04 MED ORDER — AZITHROMYCIN 250 MG PO TABS: 250.0000 mg | ORAL_TABLET | Freq: Every day | ORAL | 0 refills | Status: DC

## 2020-03-04 MED ORDER — BUMETANIDE 1 MG PO TABS: 1.0000 mg | ORAL_TABLET | Freq: Every day | ORAL | 0 refills | Status: DC

## 2020-03-04 MED ORDER — CEFDINIR 300 MG PO CAPS: 300.0000 mg | ORAL_CAPSULE | Freq: Two times a day (BID) | ORAL | 0 refills | Status: DC

## 2020-03-04 MED ORDER — METFORMIN HCL 500 MG PO TABS: 500.0000 mg | ORAL_TABLET | Freq: Two times a day (BID) | ORAL | 0 refills | Status: DC

## 2020-03-04 MED ORDER — BENZONATATE 100 MG PO CAPS: 100.0000 mg | ORAL_CAPSULE | Freq: Three times a day (TID) | ORAL | 0 refills | Status: DC | PRN

## 2020-03-04 MED ORDER — METOPROLOL TARTRATE 25 MG PO TABS: 25.0000 mg | ORAL_TABLET | Freq: Two times a day (BID) | ORAL | 0 refills | Status: DC

## 2020-03-04 MED ORDER — METOPROLOL TARTRATE 25 MG PO TABS
25.0000 mg | ORAL_TABLET | Freq: Two times a day (BID) | ORAL | Status: DC
  Administered 2020-03-04: 25 mg via ORAL
  Filled 2020-03-04: qty 1

## 2020-03-04 MED ORDER — CEFDINIR 300 MG PO CAPS: 300.0000 mg | ORAL_CAPSULE | Freq: Two times a day (BID) | ORAL | 0 refills | Status: AC

## 2020-03-04 NOTE — Discharge Summary (Signed)
Physician Discharge Summary  Julie DickinsonRosemie Colon ZOX:[id number] DOB: 28-Jul-1934 DOA: 03/02/2020  PCP: System, Pcp Not In  Admit date: 03/02/2020 Discharge date: 03/04/2020  Admitted From: Home Disposition:  Home  Recommendations for Outpatient Follow-up:  1. Follow up with PCP in 1-2 weeks, TOC to contact family on 7/6 after holiday weekend to formally schedule outpatient PCP visit  Discharge Condition:Improved CODE STATUS:Full Diet recommendation: Diabetic   Brief/Interim Summary: 86 y.o.femalewith medical history significant ofpoorly controlled DM recently admitted for hyperosmolar state discharged on insulin, GERD, asthma, HTN who presents with increased cough and lower chest pains.Pt speaks french [place] and hx was attempted via interpreter. Pt states symptoms started 2 weeks prior. Pt lives at home with daughter and caregiver. No sick contacts. Pt did report receiving COVID vaccine "when it first came out." Reports cough with subjective sob and diffuse chest pains only when coughing. No abd pain, nausea, joint pains.  ED Course:In the ED, pt was found to have random glucose of 480, sodium 129, WBC of over 14k. CXR performed and reviewed with findings of R middle lobe PNA. Patient was started on empiric azithromycin and rocephin. Hospitalist consulted for consideration for medical admission.  Discharge Diagnoses:  Principal Problem:   Sepsis due to pneumonia [facility](HCC) Active Problems:   Type 2 diabetes mellitus with complication, without long-term current use of insulin (HCC)   Hyponatremia   Noncompliance   Leukocytosis   Advanced age   Pneumonia  1. Sepsis secondary to PNA, CXR reviewed 1. RML PNA noted on CXR 2. Currently with leukocytosis, tachycardia. Currently afebrile 3. Azithromycin and rocephin was continued 4. This AM, patient reports feeling and breathing better 5. Pt was transitioned to oral azithromycin and omnicef to complete 4 more days of abx on discharge 2. DM2,  insulin dependent with hyperglycemia 1. Pt recently admitted in 5/21 for hyperosmolar state with very poorly controlled glucose 2. Pt was discharged on 70/30 insulin, being mindful of medication cost 3. Presenting glucose well in excess of 400's 4. Will continue 70/30 insulin as recommended at last visit, cont SSI coverage 5. Recent a1c of over 13 6. Appreciate input by diabetic coordinator. Education was provided. Recommendation for metformin 500mg  bid if unable to receive medication assistance. Have prescribed metformin on discharge 3. Hyponatremia 1. Likely secondary to marked hyperglycemia 2. Normalized 4. Leukocytosis 1. Secondary to sepsis with PNA 2. Cont abx per above 3. WBC is trending down 5. HTN 1. BP currently stable 2. Cont current regimen 6. Medical noncompliance 1. Suspect language barrier is main issue. Pt unable to tell me how she manages her diabetes. Pt claimed to be taking insulin twice daily but cannot recall how many units she is supposed to take 2. Chart reviewed. Pt was supposed to f/u with PCP on 6/9 for hospital follow up however pt did not go and was unaware of scheduled visit despite AVS at time of discharge 3. Would benefit from review by TOC to assess for any barriers to follow up and compliance in the future 4. [facility] Will remind pt. Appointment to be included in AVS 5. TOC to also contact patient on 7/6 after holiday weekend [facility]  Discharge Instructions   Allergies as of 03/04/2020   No Known Allergies     Medication List    STOP taking these medications   omeprazole 20 MG capsule Commonly known as: PRILOSEC   PROTONIX PO   sitaGLIPtin 100 MG tablet Commonly known as:  JANUVIA     TAKE these medications   azithromycin 250 MG tablet Commonly known as: ZITHROMAX Take 1 tablet (250 mg total) by mouth daily for 4 days.   benzonatate 100 MG capsule Commonly known as: TESSALON Take 1 capsule (100  mg total) by mouth 3 (three) times daily as needed for cough.   bumetanide 1 MG tablet Commonly known as: BUMEX Take 1 tablet (1 mg total) by mouth daily.   cefdinir 300 MG capsule Commonly known as: OMNICEF Take 1 capsule (300 mg total) by mouth every 12 (twelve) hours for 4 days.   guaiFENesin-dextromethorphan 100-10 MG/5ML syrup Commonly known as: ROBITUSSIN DM Take 5 mLs by mouth every 4 (four) hours as needed for cough.   Insulin Pen Needle 31G X 5 MM Misc Inject insulin twice daily as directed   losartan 100 MG tablet Commonly known as: COZAAR Take 1 tablet (100 mg total) by mouth daily.   metFORMIN 500 MG tablet Commonly known as: GLUCOPHAGE Take 1 tablet (500 mg total) by mouth 2 (two) times daily with a meal.   metoprolol tartrate 25 MG tablet Commonly known as: LOPRESSOR Take 1 tablet (25 mg total) by mouth 2 (two) times daily.   NovoLIN 70/30 FlexPen Relion (70-30) 100 UNIT/ML KwikPen Generic drug: insulin isophane & regular human Inject 6 Units into the skin 2 (two) times daily.       No Known Allergies    Procedures/Studies: DG Chest 2 View  Result Date: 03/02/2020 CLINICAL DATA:  Provided history: Cough, COPD EXAM: CHEST - 2 VIEW COMPARISON:  Prior chest radiographs 01/14/2020 FINDINGS: Heart size within normal limits. There is increased density in the region of the right middle lobe. Additionally, there is slight indistinctness of the right heart border on PA radiograph. The left lung is clear. A trace right pleural effusion is questioned. No evidence of pneumothorax. No acute bony abnormality. IMPRESSION: Findings suspicious for right middle lobe pneumonia. Radiographic follow-up following treatment is recommended to ensure resolution and exclude alternative etiologies. Possible trace right pleural effusion. Electronically Signed   By: Jackey Loge DO   On: 03/02/2020 12:27     Subjective: Very eager to go home  Discharge Exam: Vitals:   03/04/20  0400 03/04/20 0824  BP:  (!) 165/79  Pulse:  100  Resp: 20 18  Temp:  97.8 F (36.6 C)  SpO2:  98%   Vitals:   03/03/20 1612 03/03/20 2245 03/04/20 0400 03/04/20 0824  BP: (!) 166/78 (!) 185/87  (!) 165/79  Pulse: 95 99  100  Resp: 16  20 18   Temp: 98.7 F (37.1 C) 98.1 F (36.7 C)  97.8 F (36.6 C)  TempSrc:  Oral    SpO2: 100% 97%  98%  Weight:      Height:        General: Pt is alert, awake, not in acute distress Cardiovascular: RRR, S1/S2 +, no rubs, no gallops Respiratory: CTA bilaterally, no wheezing, no rhonchi Abdominal: Soft, NT, ND, bowel sounds + Extremities: no edema, no cyanosis   The results of significant diagnostics from this hospitalization (including imaging, microbiology, ancillary and laboratory) are listed below for reference.     Microbiology: Recent Results (from the past 240 hour(s))  SARS Coronavirus 2 by RT PCR (hospital order, performed in [facility] hospital lab) Nasopharyngeal Nasopharyngeal Swab     Status: None   Collection Time: 03/02/20  2:58 PM   Specimen: Nasopharyngeal Swab  Result Value Ref Range Status  SARS Coronavirus 2 NEGATIVE NEGATIVE Final    Comment: (NOTE) SARS-CoV-2 target nucleic acids are NOT DETECTED.  The SARS-CoV-2 RNA is generally detectable in upper and lower respiratory specimens during the acute phase of infection. The lowest concentration of SARS-CoV-2 viral copies this assay can detect is 250 copies / mL. A negative result does not preclude SARS-CoV-2 infection and should not be used as the sole basis for treatment or other patient management decisions.  A negative result may occur with improper specimen collection / handling, submission of specimen other than nasopharyngeal swab, presence of viral mutation(s) within the areas targeted by this assay, and inadequate number of viral copies (<250 copies / mL). A negative result must be combined with clinical observations, patient history, and epidemiological  information.  Fact Sheet for Patients:   BoilerBrush.com.cy  Fact Sheet for Healthcare Providers: https://pope.com/  This test is not yet approved or  cleared by the [place] FDA and has been authorized for detection and/or diagnosis of SARS-CoV-2 by FDA under an Emergency Use Authorization (EUA).  This EUA will remain in effect (meaning this test can be used) for the duration of the COVID-19 declaration under Section 564(b)(1) of the Act, 21 U.S.C. section 360bbb-3(b)(1), unless the authorization is terminated or revoked sooner.  Performed at [facility], [address] [place], [place] [address]   Blood culture (routine x 2)     Status: None (Preliminary result)   Collection Time: 03/02/20  3:17 PM   Specimen: BLOOD  Result Value Ref Range Status   Specimen Description BLOOD LEFT ANTECUBITAL  Final   Special Requests   Final    BOTTLES DRAWN AEROBIC AND ANAEROBIC Blood Culture results may not be optimal due to an inadequate volume of blood received in culture bottles   Culture   Final    NO GROWTH < 24 HOURS Performed at [facility], [address] [place], [place] [address]    Report Status PENDING  Incomplete  Urine culture     Status: Abnormal   Collection Time: 03/02/20  4:00 PM   Specimen: Urine, Random  Result Value Ref Range Status   Specimen Description URINE, RANDOM  Final   Special Requests NONE  Final   Culture (A)  Final    <10,000 COLONIES/mL INSIGNIFICANT GROWTH Performed at [facility], [address] [place], [place] [address]    Report Status 03/04/2020 FINAL  Final     Labs: BNP (last 3 results) No results for input(s): BNP in the last 8760 hours. Basic Metabolic Panel: Recent Labs  Lab 03/02/20 1131 03/03/20 0358  NA 129* 137  K 4.5 3.8  CL 95* 100  CO2 26 24  GLUCOSE 480* 163*  BUN 12 10  CREATININE 0.85 0.69  CALCIUM 8.8* 9.0   Liver Function Tests: Recent  Labs  Lab 03/02/20 1131 03/03/20 0358  AST 17 18  ALT 11 11  ALKPHOS 59 58  BILITOT 0.6 0.7  PROT 7.1 7.5  ALBUMIN 3.1* 3.3*   Recent Labs  Lab 03/02/20 1131  LIPASE 24   No results for input(s): AMMONIA in the last 168 hours. CBC: Recent Labs  Lab 03/02/20 1131 03/03/20 0358 03/04/20 0356  WBC 14.7* 13.4* 10.0  HGB 10.6* 10.8* 10.2*  HCT 33.9* 34.4* 33.0*  MCV 88.3 86.4 87.3  PLT 419* 399 411*   Cardiac Enzymes: No results for input(s): CKTOTAL, CKMB, CKMBINDEX, TROPONINI in the last 168 hours. BNP: Invalid input(s): POCBNP  CBG: Recent Labs  Lab 03/03/20 0827 03/03/20 1319 03/03/20 1608 03/03/20 2128 03/04/20 0829  GLUCAP 208* 246* 125* 281* 190*   D-Dimer No results for input(s): DDIMER in the last 72 hours. Hgb A1c No results for input(s): HGBA1C in the last 72 hours. Lipid Profile No results for input(s): CHOL, HDL, LDLCALC, TRIG, CHOLHDL, LDLDIRECT in the last 72 hours. Thyroid function studies No results for input(s): TSH, T4TOTAL, T3FREE, THYROIDAB in the last 72 hours.  Invalid input(s): FREET3 Anemia work up No results for input(s): VITAMINB12, FOLATE, FERRITIN, TIBC, IRON, RETICCTPCT in the last 72 hours. Urinalysis    Component Value Date/Time   COLORURINE STRAW (A) 03/02/2020 1600   APPEARANCEUR CLEAR 03/02/2020 1600   LABSPEC 1.008 03/02/2020 1600   PHURINE 6.0 03/02/2020 1600   GLUCOSEU >=500 (A) 03/02/2020 1600   HGBUR NEGATIVE 03/02/2020 1600   BILIRUBINUR NEGATIVE 03/02/2020 1600   KETONESUR NEGATIVE 03/02/2020 1600   PROTEINUR NEGATIVE 03/02/2020 1600   NITRITE NEGATIVE 03/02/2020 1600   LEUKOCYTESUR NEGATIVE 03/02/2020 1600   Sepsis Labs Invalid input(s): PROCALCITONIN,  WBC,  LACTICIDVEN Microbiology Recent Results (from the past 240 hour(s))  SARS Coronavirus 2 by RT PCR (hospital order, performed in [facility] hospital lab) Nasopharyngeal Nasopharyngeal Swab     Status: None   Collection Time: 03/02/20  2:58 PM    Specimen: Nasopharyngeal Swab  Result Value Ref Range Status   SARS Coronavirus 2 NEGATIVE NEGATIVE Final    Comment: (NOTE) SARS-CoV-2 target nucleic acids are NOT DETECTED.  The SARS-CoV-2 RNA is generally detectable in upper and lower respiratory specimens during the acute phase of infection. The lowest concentration of SARS-CoV-2 viral copies this assay can detect is 250 copies / mL. A negative result does not preclude SARS-CoV-2 infection and should not be used as the sole basis for treatment or other patient management decisions.  A negative result may occur with improper specimen collection / handling, submission of specimen other than nasopharyngeal swab, presence of viral mutation(s) within the areas targeted by this assay, and inadequate number of viral copies (<250 copies / mL). A negative result must be combined with clinical observations, patient history, and epidemiological information.  Fact Sheet for Patients:   BoilerBrush.com.cy  Fact Sheet for Healthcare Providers: https://pope.com/  This test is not yet approved or  cleared by the [place] FDA and has been authorized for detection and/or diagnosis of SARS-CoV-2 by FDA under an Emergency Use Authorization (EUA).  This EUA will remain in effect (meaning this test can be used) for the duration of the COVID-19 declaration under Section 564(b)(1) of the Act, 21 U.S.C. section 360bbb-3(b)(1), unless the authorization is terminated or revoked sooner.  Performed at [facility], [address], [place], [place] [address]   Blood culture (routine x 2)     Status: None (Preliminary result)   Collection Time: 03/02/20  3:17 PM   Specimen: BLOOD  Result Value Ref Range Status   Specimen Description BLOOD LEFT ANTECUBITAL  Final   Special Requests   Final    BOTTLES DRAWN AEROBIC AND ANAEROBIC Blood Culture results may not be optimal due to an inadequate volume  of blood received in culture bottles   Culture   Final    NO GROWTH < 24 HOURS Performed at [facility], [address] [place], [place] [address]    Report Status PENDING  Incomplete  Urine culture     Status: Abnormal   Collection Time: 03/02/20  4:00  PM   Specimen: Urine, Random  Result Value Ref Range Status   Specimen Description URINE, RANDOM  Final   Special Requests NONE  Final   Culture (A)  Final    <10,000 COLONIES/mL INSIGNIFICANT GROWTH Performed at [facility], [address], [place], [place] [address]    Report Status 03/04/2020 FINAL  Final   Time spent: 30 min  SIGNED:   Rickey Barbara, MD  [facility] 03/04/2020, 10:13 AM  If 7PM-7AM, please contact night-coverage

## 2020-03-04 NOTE — Care Management (Signed)
Requested CMA to schedule at [facility] on Tuesday when offices resume. Included phone number and access code for telephonic interpreter in request. CMA to call patient and notify her of appointment date and time.

## 2020-03-07 LAB — CULTURE, BLOOD (ROUTINE X 2): Culture: NO GROWTH

## 2020-03-08 LAB — CULTURE, BLOOD (ROUTINE X 2)
Culture: NO GROWTH
Special Requests: ADEQUATE

## 2020-03-20 ENCOUNTER — Ambulatory Visit: Payer: Medicare (Managed Care) | Attending: Internal Medicine | Admitting: Internal Medicine

## 2020-03-20 ENCOUNTER — Encounter: Payer: Self-pay | Admitting: Internal Medicine

## 2020-03-20 ENCOUNTER — Other Ambulatory Visit: Payer: Self-pay

## 2020-03-20 VITALS — BP 183/84 | HR 72 | Resp 16 | Ht 59.0 in | Wt 134.2 lb

## 2020-03-20 DIAGNOSIS — Z09 Encounter for follow-up examination after completed treatment for conditions other than malignant neoplasm: Secondary | ICD-10-CM | POA: Diagnosis not present

## 2020-03-20 DIAGNOSIS — R05 Cough: Secondary | ICD-10-CM

## 2020-03-20 DIAGNOSIS — Z8611 Personal history of tuberculosis: Secondary | ICD-10-CM | POA: Insufficient documentation

## 2020-03-20 DIAGNOSIS — E118 Type 2 diabetes mellitus with unspecified complications: Secondary | ICD-10-CM | POA: Diagnosis not present

## 2020-03-20 DIAGNOSIS — I10 Essential (primary) hypertension: Secondary | ICD-10-CM

## 2020-03-20 DIAGNOSIS — J159 Unspecified bacterial pneumonia: Secondary | ICD-10-CM | POA: Diagnosis not present

## 2020-03-20 DIAGNOSIS — D649 Anemia, unspecified: Secondary | ICD-10-CM

## 2020-03-20 DIAGNOSIS — R053 Chronic cough: Secondary | ICD-10-CM

## 2020-03-20 LAB — POCT GLYCOSYLATED HEMOGLOBIN (HGB A1C): HbA1c, POC (controlled diabetic range): 7.9 % — AB (ref 0.0–7.0)

## 2020-03-20 LAB — GLUCOSE, POCT (MANUAL RESULT ENTRY): POC Glucose: 124 mg/dl — AB (ref 70–99)

## 2020-03-20 MED ORDER — NOVOLIN 70/30 FLEXPEN RELION (70-30) 100 UNIT/ML ~~LOC~~ SUPN: 6.0000 [IU] | PEN_INJECTOR | Freq: Two times a day (BID) | SUBCUTANEOUS | 6 refills | Status: DC

## 2020-03-20 MED ORDER — METFORMIN HCL 500 MG PO TABS: 500.0000 mg | ORAL_TABLET | Freq: Two times a day (BID) | ORAL | 5 refills | Status: DC

## 2020-03-20 MED ORDER — BENZONATATE 100 MG PO CAPS: 100.0000 mg | ORAL_CAPSULE | Freq: Three times a day (TID) | ORAL | 0 refills | Status: DC | PRN

## 2020-03-20 MED ORDER — MOXIFLOXACIN HCL 400 MG PO TABS: 400.0000 mg | ORAL_TABLET | Freq: Every day | ORAL | 0 refills | Status: DC

## 2020-03-20 MED ORDER — METOPROLOL TARTRATE 25 MG PO TABS: 25.0000 mg | ORAL_TABLET | Freq: Two times a day (BID) | ORAL | 5 refills | Status: DC

## 2020-03-20 MED ORDER — LOSARTAN POTASSIUM 100 MG PO TABS: 100.0000 mg | ORAL_TABLET | Freq: Every day | ORAL | 5 refills | Status: DC

## 2020-03-20 NOTE — Progress Notes (Addendum)
Patient ID: Julie Colon, female    DOB: [birth year]  MRN: [id number]  CC: Hospitalization Follow-up, Diabetes, Hypertension, and Cough   Subjective: Julie Colon is a 86 y.o. female who presents for new pt visit and hosp f/u. Her concerns today include:  Patient with history of DM type II, HTN, asthma, GERD, noncompliance,  Patient is a poor historian. Patient reports moving here about 3 months ago from [place].  She lives with her daughter.  She was hospitalized 7/1-11/2019 with increased cough and chest pains.  She tested negative for Covid.  Chest x-ray revealed right middle lobe pneumonia.  She was treated appropriately and then discharged on Zithromax and cefdinir for 4 days.  Hospital course was complicated with elevated blood sugars.  She was discharged on Novolin 70/30 60 units twice a day along with Metformin.  Recent A1c was over 13.  Today: Patient reports that the cough had subsided upon completion of antibiotics until about 1 week ago when she started having productive cough again.  Cough is productive of white phlegm.  She denies any blood in the sputum.  Denies any fever or night sweats.  She feels shortness of breath sometimes.  She denies any sick contacts.  Reports having been treated for TB about 20 years ago in [place].  Patient told me that she had the COVID-19 vaccines.  However I was able to call her daughter by a telephone and speak with her briefly while I was seeing the patient.  She reports that patient did not get the COVID-19 vaccine.  DM: Patient reports compliance with taking insulin twice a day and Metformin.  She checks blood sugars about every 3 days.  She does not recall her readings.  She is overdue for eye exam.  HTN: She is on Cozaar and metoprolol.  She has her medication bottles with her today.  She has not taken medicines as yet for today.  She limits salt in the foods.  She denies any headaches or dizziness.  No chest pains.  Past medical,  social, family history reviewed.  Patient Active Problem List   Diagnosis Date Noted   Hyponatremia 03/02/2020   Sepsis due to pneumonia (HCC) 03/02/2020   Noncompliance 03/02/2020   Leukocytosis 03/02/2020   Advanced age 89/09[birth year]2019   Pneumonia 03/02/2020   Hyperglycemic hyperosmolar nonketotic coma (HCC) 01/14/2020   Type 2 diabetes mellitus with complication, without long-term current use of insulin (HCC) 01/14/2020   Hyperkalemia 01/14/2020   AKI (acute kidney injury) (HCC) 01/14/2020     Current Outpatient Medications on File Prior to Visit  Medication Sig Dispense Refill   bumetanide (BUMEX) 1 MG tablet Take 1 tablet (1 mg total) by mouth daily. 30 tablet 0   Insulin Pen Needle 31G X 5 MM MISC Inject insulin twice daily as directed 100 each 0   No current facility-administered medications on file prior to visit.    No Known Allergies  Social History   Socioeconomic History   Marital status: Single    Spouse name: Not on file   Number of children: Not on file   Years of education: Not on file   Highest education level: Not on file  Occupational History   Not on file  Tobacco Use   Smoking status: Never Smoker   Smokeless tobacco: Never Used  Vaping Use   Vaping Use: Never used  Substance and Sexual Activity   Alcohol use: No   Drug use: No   Sexual activity:  Not Currently  Other Topics Concern   Not on file  Social History Narrative   Speaks [place]from [place]   [place] [place] [facility] [place]  Daughter not well versed in mother's care yet   Social Determinants of Health   Financial Resource Strain:    Difficulty of Paying Living Expenses:   Food Insecurity:    Worried About Programme researcher, broadcasting/film/videounning Out of Food in the Last Year:    Baristaan Out of Food in the Last Year:   Transportation Needs:    Freight forwarderLack of Transportation (Medical):    Lack of Transportation (Non-Medical):   Physical Activity:    Days of Exercise per Week:     Minutes of Exercise per Session:   Stress:    Feeling of Stress :   Social Connections:    Frequency of Communication with Friends and Family:    Frequency of Social Gatherings with Friends and Family:    Attends Religious Services:    Active Member of Clubs or Organizations:    Attends Engineer, structuralClub or Organization Meetings:    Marital Status:   Intimate Partner Violence:    Fear of Current or Ex-Partner:    Emotionally Abused:    Physically Abused:    Sexually Abused:     Family History  Problem Relation Age of Onset   Diabetes Mother     No past surgical history on file.  ROS: Review of Systems Negative except as stated above  PHYSICAL EXAM: BP (!) 183/84    Pulse 72    Resp 16    Ht 4\' 11"  (1.499 m)    Wt 134 lb 3.2 oz (60.9 kg)    SpO2 95%    BMI 27.11 kg/m   Wt Readings from Last 3 Encounters:  03/20/20 134 lb 3.2 oz (60.9 kg)  03/03/20 130 lb 4.7 oz (59.1 kg)  01/14/20 122 lb 2.2 oz (55.4 kg)    Physical Exam  General appearance - alert, well appearing, and in no distress.  She has bouts of wet cough in my presence. Mental status -patient is very talkative.  She answers most questions appropriately but has to be redirected in her answers as she does go off on tangents.   Eyes - pupils equal and reactive, extraocular eye movements intact Neck - supple, no significant adenopathy Lymphatics - no palpable lymphadenopathy, no hepatosplenomegaly Chest - clear to auscultation, no wheezes, rales or rhonchi, symmetric air entry Heart - normal rate, regular rhythm, normal S1, S2, no murmurs, rubs, clicks or gallops Extremities - peripheral pulses normal, no pedal edema, no clubbing or cyanosis  Lab Results  Component Value Date   HGBA1C 7.9 (A) 03/20/2020    CMP Latest Ref Rng & Units 03/03/2020 03/02/2020 01/17/2020  Glucose 70 - 99 mg/dL 409(W163(H) 119(J480(H) 478(G479(H)  BUN 8 - 23 mg/dL 10 12 12   Creatinine 0.44 - 1.00 mg/dL 9.560.69 2.130.85 0.860.84  Sodium 135 - 145 mmol/L 137  129(L) 131(L)  Potassium 3.5 - 5.1 mmol/L 3.8 4.5 4.9  Chloride 98 - 111 mmol/L 100 95(L) 98  CO2 22 - 32 mmol/L 24 26 26   Calcium 8.9 - 10.3 mg/dL 9.0 5.7(Q8.8(L) 4.6(N8.8(L)  Total Protein 6.5 - 8.1 g/dL 7.5 7.1 -  Total Bilirubin 0.3 - 1.2 mg/dL 0.7 0.6 -  Alkaline Phos 38 - 126 U/L 58 59 -  AST 15 - 41 U/L 18 17 -  ALT 0 - 44 U/L 11 11 -   Lipid Panel  No results found for: CHOL, TRIG, HDL, CHOLHDL, VLDL, LDLCALC, LDLDIRECT  CBC    Component Value Date/Time   WBC 10.0 03/04/2020 0356   RBC 3.78 (L) 03/04/2020 0356   HGB 10.2 (L) 03/04/2020 0356   HCT 33.0 (L) 03/04/2020 0356   PLT 411 (H) 03/04/2020 0356   MCV 87.3 03/04/2020 0356   MCH 27.0 03/04/2020 0356   MCHC 30.9 03/04/2020 0356   RDW 14.9 03/04/2020 0356   LYMPHSABS 2.4 04/25/2016 1340   MONOABS 0.6 04/25/2016 1340   EOSABS 0.1 04/25/2016 1340   BASOSABS 0.0 04/25/2016 1340    ASSESSMENT AND PLAN: 1. Hospital discharge follow-up 2. Community acquired bacterial pneumonia -Patient with recurrent wet cough post treatment of community-acquired pneumonia earlier this month.  We will repeat chest x-ray and give another course of antibiotics in the form of Avelox.  We will also recheck her for Covid.  We will also contact the health department to have sputum cultures collected to screen for TB to make sure she is not having a reactivation. - DG Chest 2 View; Future - benzonatate (TESSALON) 100 MG capsule; Take 1 capsule (100 mg total) by mouth 3 (three) times daily as needed for cough.  Dispense: 30 capsule; Refill: 0 - moxifloxacin (AVELOX) 400 MG tablet; Take 1 tablet (400 mg total) by mouth daily.  Dispense: 7 tablet; Refill: 0 - CBC With Differential - Comprehensive metabolic panel  3. Persistent cough - DG Chest 2 View; Future - benzonatate (TESSALON) 100 MG capsule; Take 1 capsule (100 mg total) by mouth 3 (three) times daily as needed for cough.  Dispense: 30 capsule; Refill: 0 - Novel Coronavirus, NAA ([facility])  4.  History of primary TB See plan under #2 above  5. Type 2 diabetes mellitus with complication, without long-term current use of insulin (HCC) Her A1c is coming down nicely.  She will continue current dose of insulin and Metformin.  Advised her to check blood sugars at least once a day and bring in readings on next visit - POCT glucose (manual entry) - POCT glycosylated hemoglobin (Hb A1C) - Microalbumin / creatinine urine ratio - metFORMIN (GLUCOPHAGE) 500 MG tablet; Take 1 tablet (500 mg total) by mouth 2 (two) times daily with a meal.  Dispense: 60 tablet; Refill: 5 - insulin isophane & regular human (NOVOLIN 70/30 FLEXPEN RELION) (70-30) 100 UNIT/ML KwikPen; Inject 6 Units into the skin 2 (two) times daily.  Dispense: 5 mL; Refill: 6  6. Essential hypertension Not at goal but patient has not taken medicines as yet for today.  Refill given. - losartan (COZAAR) 100 MG tablet; Take 1 tablet (100 mg total) by mouth daily.  Dispense: 30 tablet; Refill: 5 - metoprolol tartrate (LOPRESSOR) 25 MG tablet; Take 1 tablet (25 mg total) by mouth 2 (two) times daily.  Dispense: 60 tablet; Refill: 5    Patient was given the opportunity to ask questions.  Patient verbalized understanding of the plan and was able to repeat key elements of the plan.  [employer] used #[id number]   Orders Placed This Encounter  Procedures   Novel Coronavirus, NAA ([facility])   DG Chest 2 View   Microalbumin / creatinine urine ratio   CBC With Differential   Comprehensive metabolic panel   POCT glucose (manual entry)   POCT glycosylated hemoglobin (Hb A1C)     Requested Prescriptions   Signed Prescriptions Disp Refills   losartan (COZAAR) 100 MG tablet 30 tablet 5    Sig: Take 1 tablet (  100 mg total) by mouth daily.   benzonatate (TESSALON) 100 MG capsule 30 capsule 0    Sig: Take 1 capsule (100 mg total) by mouth 3 (three) times daily as needed for cough.   metFORMIN (GLUCOPHAGE) 500 MG tablet 60  tablet 5    Sig: Take 1 tablet (500 mg total) by mouth 2 (two) times daily with a meal.   insulin isophane & regular human (NOVOLIN 70/30 FLEXPEN RELION) (70-30) 100 UNIT/ML KwikPen 5 mL 6    Sig: Inject 6 Units into the skin 2 (two) times daily.   metoprolol tartrate (LOPRESSOR) 25 MG tablet 60 tablet 5    Sig: Take 1 tablet (25 mg total) by mouth 2 (two) times daily.   moxifloxacin (AVELOX) 400 MG tablet 7 tablet 0    Sig: Take 1 tablet (400 mg total) by mouth daily.    Return in about 1 month (around 04/20/2020).  Jonah Blue, MD, FACP

## 2020-03-20 NOTE — Patient Instructions (Signed)
Please go to [facility] radiology department to have x-rays done.  I have sent a prescription to your pharmacy for a new antibiotic called Avelox.  You will take it daily for 7 days.  I will contact the health department to have them come out and collect sputum samples from you to recheck you for tuberculosis.  I have sent refills on your blood pressure medications and your diabetes medicines including insulin.

## 2020-03-21 ENCOUNTER — Telehealth: Payer: Self-pay

## 2020-03-21 LAB — COMPREHENSIVE METABOLIC PANEL
ALT: 8 IU/L (ref 0–32)
AST: 16 IU/L (ref 0–40)
Albumin/Globulin Ratio: 1.1 — ABNORMAL LOW (ref 1.2–2.2)
Albumin: 3.9 g/dL (ref 3.6–4.6)
Alkaline Phosphatase: 67 IU/L (ref 48–121)
BUN/Creatinine Ratio: 23 (ref 12–28)
BUN: 14 mg/dL (ref 8–27)
Bilirubin Total: 0.4 mg/dL (ref 0.0–1.2)
CO2: 26 mmol/L (ref 20–29)
Calcium: 9.5 mg/dL (ref 8.7–10.3)
Chloride: 101 mmol/L (ref 96–106)
Creatinine, Ser: 0.62 mg/dL (ref 0.57–1.00)
GFR calc Af Amer: 96 mL/min/{1.73_m2} (ref >59)
GFR calc non Af Amer: 83 mL/min/{1.73_m2} (ref >59)
Globulin, Total: 3.6 g/dL (ref 1.5–4.5)
Glucose: 128 mg/dL — ABNORMAL HIGH (ref 65–99)
Potassium: 5.3 mmol/L — ABNORMAL HIGH (ref 3.5–5.2)
Sodium: 137 mmol/L (ref 134–144)
Total Protein: 7.5 g/dL (ref 6.0–8.5)

## 2020-03-21 LAB — CBC WITH DIFFERENTIAL
Basophils Absolute: 0 10*3/uL (ref 0.0–0.2)
Basos: 1 %
EOS (ABSOLUTE): 0.3 10*3/uL (ref 0.0–0.4)
Eos: 4 %
Hematocrit: 34.8 % (ref 34.0–46.6)
Hemoglobin: 10.7 g/dL — ABNORMAL LOW (ref 11.1–15.9)
Immature Grans (Abs): 0 10*3/uL (ref 0.0–0.1)
Immature Granulocytes: 0 %
Lymphocytes Absolute: 3 10*3/uL (ref 0.7–3.1)
Lymphs: 42 %
MCH: 27.2 pg (ref 26.6–33.0)
MCHC: 30.7 g/dL — ABNORMAL LOW (ref 31.5–35.7)
MCV: 88 fL (ref 79–97)
Monocytes Absolute: 0.4 10*3/uL (ref 0.1–0.9)
Monocytes: 6 %
Neutrophils Absolute: 3.5 10*3/uL (ref 1.4–7.0)
Neutrophils: 47 %
RBC: 3.94 x10E6/uL (ref 3.77–5.28)
RDW: 15 % (ref 11.7–15.4)
WBC: 7.2 10*3/uL (ref 3.4–10.8)

## 2020-03-21 LAB — NOVEL CORONAVIRUS, NAA: SARS-CoV-2, NAA: NOT DETECTED

## 2020-03-21 LAB — SARS-COV-2, NAA 2 DAY TAT

## 2020-03-21 NOTE — Progress Notes (Signed)
Please let patient and her daughter know that she has a mild but stable anemia when compared to the labs that were done earlier this month.  We will check iron levels to see if this is iron deficiency.  Her potassium level is mildly elevated.  She should cut back on eating potassium rich foods like oranges, bananas and orange juice.  Please remind her to go to the radiology department at [facility] to have the chest x-ray done.

## 2020-03-21 NOTE — Telephone Encounter (Signed)
Call placed to [facility] Department TB Control.  Message left for Tammy F. requesting a call back to this CM # [phone].   Per Dr Laural Benes, need to request sputum collection to test for TB.

## 2020-03-21 NOTE — Telephone Encounter (Signed)
Call received from Tammy/TB Control [facility] Dept.  Informed her of  request from Dr Laural Benes to obtain sputum sample to test for TB as patient has patient has a history of active TB that was treated 20 years ago.  Tammy requested a copy of the recent office visit note and demographics be faxed to her # [phone].   information then faxed as requested.

## 2020-03-21 NOTE — Addendum Note (Signed)
Addended by: Jonah Blue B on: 03/21/2020 12:43 PM   Modules accepted: Orders

## 2020-03-24 ENCOUNTER — Other Ambulatory Visit: Payer: Self-pay | Admitting: Internal Medicine

## 2020-03-24 ENCOUNTER — Telehealth: Payer: Self-pay

## 2020-03-24 LAB — IRON,TIBC AND FERRITIN PANEL
Ferritin: 27 ng/mL (ref 15–150)
Iron Saturation: 13 % — ABNORMAL LOW (ref 15–55)
Iron: 42 ug/dL (ref 27–139)
Total Iron Binding Capacity: 313 ug/dL (ref 250–450)
UIBC: 271 ug/dL (ref 118–369)

## 2020-03-24 LAB — SPECIMEN STATUS REPORT

## 2020-03-24 MED ORDER — FERROUS SULFATE 325 (65 FE) MG PO TABS: 325.0000 mg | ORAL_TABLET | Freq: Every day | ORAL | 1 refills | Status: DC

## 2020-03-24 NOTE — Telephone Encounter (Signed)
Contacted pt daughter to go over lab results pt daughter didn't answer lvm asking pt daughter to give a call back at her earliest convenience

## 2020-03-24 NOTE — Progress Notes (Signed)
Let patient and her daughter know that her iron level is in the low normal range.  I recommend taking iron supplement daily.  I will send the prescription to her pharmacy for iron supplement name ferrous sulfate.  She should pick up the prescription from her pharmacy.

## 2020-03-24 NOTE — Telephone Encounter (Signed)
error 

## 2020-03-29 ENCOUNTER — Inpatient Hospital Stay: Payer: Medicare (Managed Care) | Admitting: Family Medicine

## 2020-03-29 ENCOUNTER — Telehealth: Payer: Self-pay

## 2020-03-29 NOTE — Telephone Encounter (Signed)
Call placed to [facility] # [phone] [facility] TB control to check on status of referral.  Message left with call back requested to this CM # [phone].

## 2020-04-20 ENCOUNTER — Ambulatory Visit (HOSPITAL_COMMUNITY)
Admission: RE | Admit: 2020-04-20 | Discharge: 2020-04-20 | Disposition: A | Discharge status: Home / Self Care | Payer: Medicare (Managed Care) | Source: Ambulatory Visit | Attending: Internal Medicine | Admitting: Internal Medicine

## 2020-04-20 ENCOUNTER — Ambulatory Visit: Payer: Medicaid Other | Attending: Internal Medicine | Admitting: Pharmacist

## 2020-04-20 ENCOUNTER — Ambulatory Visit: Payer: Medicaid Other | Attending: Internal Medicine | Admitting: Internal Medicine

## 2020-04-20 ENCOUNTER — Encounter: Payer: Self-pay | Admitting: Internal Medicine

## 2020-04-20 ENCOUNTER — Other Ambulatory Visit: Payer: Self-pay

## 2020-04-20 VITALS — BP 198/104 | HR 84 | Temp 98.2°F | Resp 16 | Wt 133.8 lb

## 2020-04-20 DIAGNOSIS — J159 Unspecified bacterial pneumonia: Secondary | ICD-10-CM | POA: Insufficient documentation

## 2020-04-20 DIAGNOSIS — Z23 Encounter for immunization: Secondary | ICD-10-CM

## 2020-04-20 DIAGNOSIS — R053 Chronic cough: Secondary | ICD-10-CM

## 2020-04-20 DIAGNOSIS — I10 Essential (primary) hypertension: Secondary | ICD-10-CM | POA: Diagnosis not present

## 2020-04-20 DIAGNOSIS — R05 Cough: Secondary | ICD-10-CM | POA: Insufficient documentation

## 2020-04-20 DIAGNOSIS — R059 Cough, unspecified: Secondary | ICD-10-CM

## 2020-04-20 DIAGNOSIS — E611 Iron deficiency: Secondary | ICD-10-CM | POA: Insufficient documentation

## 2020-04-20 DIAGNOSIS — K219 Gastro-esophageal reflux disease without esophagitis: Secondary | ICD-10-CM | POA: Insufficient documentation

## 2020-04-20 DIAGNOSIS — E118 Type 2 diabetes mellitus with unspecified complications: Secondary | ICD-10-CM

## 2020-04-20 DIAGNOSIS — E875 Hyperkalemia: Secondary | ICD-10-CM

## 2020-04-20 LAB — GLUCOSE, POCT (MANUAL RESULT ENTRY): POC Glucose: 165 mg/dl — AB (ref 70–99)

## 2020-04-20 MED ORDER — OMEPRAZOLE 20 MG PO CPDR: 20.0000 mg | DELAYED_RELEASE_CAPSULE | Freq: Every day | ORAL | 6 refills | Status: DC

## 2020-04-20 MED ORDER — ACCU-CHEK GUIDE ME W/DEVICE KIT: PACK | 0 refills | Status: DC

## 2020-04-20 MED ORDER — ACCU-CHEK FASTCLIX LANCETS MISC: 1 refills | Status: DC

## 2020-04-20 MED ORDER — ACCU-CHEK GUIDE VI STRP: ORAL_STRIP | 12 refills | Status: DC

## 2020-04-20 NOTE — Progress Notes (Signed)
Patient ID: Julie Colon, female    DOB: 18-Dec-1933  MRN: [id number]  CC: f/u cough, HTN  Subjective: Julie Colon is a 86 y.o. female who presents for 1 mth f/u.  Pt is alone today for most of the visit. Reports her daughter went to drop her kids off to school. Daughter, Othello, came in as I was concluding the visit. Her concerns today include:  Patient with history of DM type II, HTN, asthma, GERD, noncompliance,  On last visit with me, patient complained of recurrent wet cough post treatment of community-acquired pneumonia 1 month earlier.  There was concern for recurrent pneumonia versus reactivation of TB.  Placed on Avelox for 7 days, chest x-ray ordered and patient information given to the health department so that they can do sputum cultures on her.  COVID-19 test was negative.  She has not yet received the COVID-19 vaccine. -pt is not sure if she got the abx.  She has bottle of iron with her today and thinks this is the abx.  Reports she took the iron for 7 days but it upset her stomach -confirms that HD did come out to get sputum for cx -She reports today to have the cough has resolved.  She is feeling better except she notices a feeling of tiredness when she exerts herself especially walking up steps  BP was elevated on last visit.  She had not taken meds as yet that day.  BP elevated again today.  She reports she has not taken meds as yet for the morning but confirms that she takes meds every morning around 10 a.m. she does have her blood pressure medications with her.  She does have a BP home device but has not check in several days.  DM: Initially patient told me that she did not have a device to check blood sugars.  However her daughter was able to tell me that she does have a device she is just out of the strips and has been out for several days.  Daughter inquired whether it is okay for her to give her the insulin when the blood sugars are in the 90s.  She states  that 1 day blood sugar was 96 and she did not give her the insulin.  She is taking the Metformin.    Found to have stable mild to moderate anemia.  Iron studies c/w iron def.  Prescribed Iron supplement.  Initially it caused N/V when she took it.  She found that taking it with oatmeal she was able to tolerate so now she is taking every morning with a little bit of oatmeal.  Stools black for past 2 wks.  No blood in stools.   She does not recall every having c-scope.  -Complains of burning in the stomach and a feeling of sour taste in the throat when she eats certain foods like vegetable soup.  She has been having some increased heartburn recently.  She is not on any NSAIDs.   Patient Active Problem List   Diagnosis Date Noted  . 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 Essential hypertension 03/20/2020  . Hyponatremia 03/02/2020  . Sepsis due to pneumonia ([place]) 03/02/2020  . Noncompliance 03/02/2020  . Leukocytosis 03/02/2020  . Advanced age 50/09/2019  . Pneumonia 03/02/2020  . Hyperglycemic hyperosmolar nonketotic coma ([place]) 01/14/2020  . Type 2 diabetes mellitus with complication, without long-term current use of insulin ([place]) 01/14/2020  . Hyperkalemia 01/14/2020  . AKI (acute kidney injury) (  [place]) 01/14/2020     Current Outpatient Medications on File Prior to Visit  Medication Sig Dispense Refill  . bumetanide (BUMEX) 1 MG tablet Take 1 tablet (1 mg total) by mouth daily. 30 tablet 0  . ferrous sulfate 325 (65 FE) MG tablet Take 1 tablet (325 mg total) by mouth daily with breakfast. 100 tablet 1  . insulin isophane & regular human (NOVOLIN 70/30 FLEXPEN RELION) (70-30) 100 UNIT/ML KwikPen Inject 6 Units into the skin 2 (two) times daily. 5 mL 6  . Insulin Pen Needle 31G X 5 MM MISC Inject insulin twice daily as directed 100 each 0  . losartan (COZAAR) 100 MG tablet Take 1 tablet (100 mg total) by mouth daily. 30 tablet 5  . metFORMIN (GLUCOPHAGE) 500 MG tablet Take 1 tablet (500 mg  total) by mouth 2 (two) times daily with a meal. 60 tablet 5  . metoprolol tartrate (LOPRESSOR) 25 MG tablet Take 1 tablet (25 mg total) by mouth 2 (two) times daily. 60 tablet 5   No current facility-administered medications on file prior to visit.    No Known Allergies  Social History   Socioeconomic History  . Marital status: Single    Spouse name: Not on file  . Number of children: Not on file  . Years of education: Not on file  . Highest education level: Not on file  Occupational History  . Not on file  Tobacco Use  . Smoking status: Never Smoker  . Smokeless tobacco: Never Used  Vaping Use  . Vaping Use: Never used  Substance and Sexual Activity  . Alcohol use: No  . Drug use: No  . Sexual activity: Not Currently  Other Topics Concern  . Not on file  Social History Narrative   Speaks [place] (from the [place])   [place] to [facility] May 1. 2021 from [place].  Daughter not well versed in mother's care yet   Social Determinants of Health   Financial Resource Strain:   . Difficulty of Paying Living Expenses: Not on file  Food Insecurity:   . Worried About Charity fundraiser in the Last Year: Not on file  . Ran Out of Food in the Last Year: Not on file  Transportation Needs:   . Lack of Transportation (Medical): Not on file  . Lack of Transportation (Non-Medical): Not on file  Physical Activity:   . Days of Exercise per Week: Not on file  . Minutes of Exercise per Session: Not on file  Stress:   . Feeling of Stress : Not on file  Social Connections:   . Frequency of Communication with Friends and Family: Not on file  . Frequency of Social Gatherings with Friends and Family: Not on file  . Attends Religious Services: Not on file  . Active Member of Clubs or Organizations: Not on file  . Attends Archivist Meetings: Not on file  . Marital Status: Not on file  Intimate Partner Violence:   . Fear of Current or Ex-Partner: Not on file  . Emotionally Abused:  Not on file  . Physically Abused: Not on file  . Sexually Abused: Not on file    Family History  Problem Relation Age of Onset  . Diabetes Mother     No past surgical history on file.  ROS: Review of Systems Negative except as stated above  PHYSICAL EXAM: BP (!) 198/104   Pulse 84   Temp 98.2 F (36.8 C)   Resp 16  Wt 133 lb 12.8 oz (60.7 kg)   SpO2 100%   BMI 27.02 kg/m   Wt Readings from Last 3 Encounters:  04/20/20 133 lb 12.8 oz (60.7 kg)  03/20/20 134 lb 3.2 oz (60.9 kg)  03/03/20 130 lb 4.7 oz (59.1 kg)    Physical Exam  General appearance - alert, well appearing, elderly female and in no distress.  Patient has to be constantly reminded to keep her face mask on during the visit. Mental status -patient is oriented to person and place Eyes -slightly pale conjunctiva Mouth - mucous membranes moist, pharynx normal without lesions Neck - supple, no significant adenopathy Chest -patient constantly has to be coached to take deep breaths in and out.  I heard some Velcro-like crackles at the left lower base.  other lung fields sound clear Heart - normal rate, regular rhythm, normal S1, S2, no murmurs, rubs, clicks or gallops Extremities -trace lower extremity edema  Lab Results  Component Value Date   HGBA1C 7.9 (A) 03/20/2020   Results for orders placed or performed in visit on 04/20/20  POCT glucose (manual entry)  Result Value Ref Range   POC Glucose 165 (A) 70 - 99 mg/dl     CMP Latest Ref Rng & Units 03/20/2020 03/03/2020 03/02/2020  Glucose 65 - 99 mg/dL 128(H) 163(H) 480(H)  BUN 8 - 27 mg/dL _0 Creatinine 0.57 - 1.00 mg/dL 0.62 0.69 0.85  Sodium 134 - 144 mmol/L 137 137 129(L)  Potassium 3.5 - 5.2 mmol/L 5.3(H) 3.8 4.5  Chloride 96 - 106 mmol/L 101 100 95(L)  CO2 20 - 29 mmol/L _1 Calcium 8.7 - 10.3 mg/dL 9.5 9.0 8.8(L)  Total Protein 6.0 - 8.5 g/dL 7.5 7.5 7.1  Total Bilirubin 0.0 - 1.2 mg/dL 0.4 0.7 0.6  Alkaline Phos 48 - 121 IU/L 67  58 59  AST 0 - 40 IU/L _2 ALT 0 - 32 IU/L _3 Lipid Panel  No results found for: CHOL, TRIG, HDL, CHOLHDL, VLDL, LDLCALC, LDLDIRECT  CBC    Component Value Date/Time   WBC 7.2 03/20/2020 1135   WBC 10.0 03/04/2020 0356   RBC 3.94 03/20/2020 1135   RBC 3.78 (L) 03/04/2020 0356   HGB 10.7 (L) 03/20/2020 1135   HCT 34.8 03/20/2020 1135   PLT 411 (H) 03/04/2020 0356   MCV 88 03/20/2020 1135   MCH 27.2 03/20/2020 1135   MCH 27.0 03/04/2020 0356   MCHC 30.7 (L) 03/20/2020 1135   MCHC 30.9 03/04/2020 0356   RDW 15.0 03/20/2020 1135   LYMPHSABS 3.0 03/20/2020 1135   MONOABS 0.6 04/25/2016 1340   EOSABS 0.3 03/20/2020 1135   BASOSABS 0.0 03/20/2020 1135    ASSESSMENT AND PLAN: 1. Cough Resolved.  Not sure whether patient took the antibiotic or not but in any event she is clinically improved.  I have not received word from the health department as yet on the results of the AFB cultures.  2. Type 2 diabetes mellitus with complication, without long-term current use of insulin ([place]) I have sent prescription to her pharmacy for diabetic testing supplies.  Advised to get a diabetic eye exam every year.  Referral submitted to ophthalmology. -Advised daughter to have her continue the insulin and Metformin.  As long as blood sugars are staying above 85 she should be okay.  If blood sugars start to drop below that then she should let me know so that we can  either stop or adjust the insulin. - POCT glucose (manual entry) - Ambulatory referral to Ophthalmology - Blood Glucose Monitoring Suppl (ACCU-CHEK GUIDE ME) w/Device KIT; UAD  Dispense: 1 kit; Refill: 0 - glucose blood (ACCU-CHEK GUIDE) test strip; Check BS once a day  Dispense: 100 each; Refill: 12 - Accu-Chek FastClix Lancets MISC; UAD  Dispense: 100 each; Refill: 1  3. Essential hypertension Not at goal.  Patient has not taken medicines as yet for today.  Advised that she check her blood pressure at least twice a week and  write down the readings.  Goal is 130/80 or lower.  I will have her follow-up with the clinical pharmacist in 1 to 2 weeks for recheck.  Advised to take her blood pressure medicines before she comes.  4. Gastroesophageal reflux disease without esophagitis GERD precautions discussed and encouraged.  Went over foods to avoid.  Advised to eat her last meal at least 2 to 3 hours before she lays down at night and to sleep with her head a little elevated.  Start omeprazole - omeprazole (PRILOSEC) 20 MG capsule; Take 1 capsule (20 mg total) by mouth daily.  Dispense: 30 capsule; Refill: 6  5. Iron deficiency Continue iron supplement.  Discussed with her whether she would want to pursue GI work-up given her age.  Patient states that if it is necessary she would be willing to do it.  Current weight is stable.  I will opt for putting her on iron supplement and seeing how she does. - CBC  6. Need for vaccination against Streptococcus pneumoniae using pneumococcal conjugate vaccine 13 Currently out of this.  We will give it on follow-up visit  7. Hyperkalemia - Potassium  8. High priority for COVID-19 virus vaccination Strongly advised her to get the COVID-19 vaccine as soon as possible.  9. Need for influenza vaccination Given today.   Patient was given the opportunity to ask questions.  Patient verbalized understanding of the plan and was able to repeat key elements of the plan.   Orders Placed This Encounter  Procedures  . Potassium  . CBC  . Ambulatory referral to Ophthalmology  . POCT glucose (manual entry)     Requested Prescriptions   Signed Prescriptions Disp Refills  . omeprazole (PRILOSEC) 20 MG capsule 30 capsule 6    Sig: Take 1 capsule (20 mg total) by mouth daily.  . Blood Glucose Monitoring Suppl (ACCU-CHEK GUIDE ME) w/Device KIT 1 kit 0    Sig: UAD  . glucose blood (ACCU-CHEK GUIDE) test strip 100 each 12    Sig: Check BS once a day  . Accu-Chek FastClix Lancets MISC  100 each 1    Sig: UAD    Return in about 3 months (around 07/21/2020) for f/U with [facility] in 1 week for repeat blood pressure check.  Karle Plumber, MD, FACP

## 2020-04-20 NOTE — Progress Notes (Signed)
Patient presents for vaccination against influenza per orders of Dr. Johnson. Consent given. Counseling provided. No contraindications exists. Vaccine administered without incident.  ° °Luke Van Ausdall, PharmD, CPP °Clinical Pharmacist °[facility] °[phone] ° °

## 2020-04-20 NOTE — Patient Instructions (Addendum)
Please go to the radiology department at [facility] to get the x-ray done.  I have placed the order electronically.  When you go there you give them your name and let them know that you were there to have an x-ray done as ordered by your doctor and they will pull up the order.  Your blood pressure is quite elevated.  Please check your blood pressure at least twice a week and write down the numbers.  The goal is 130/80 or lower.  Please bring your blood pressure readings with you when you come to see the clinical pharmacist next week for repeat blood pressure check.  You have acid reflux.  I have started you on a medication called omeprazole to help decrease the reflux symptoms.  I have also printed some information for you below 1 acid reflux.  I have referred you to the ophthalmologist for your routine eye exam that is recommended once a year in patients with diabetes.   I strongly encourage you to get the COVID-19 vaccine.  You can get this at [facility] pharmacy, [facility] or [facility].  I have sent a prescription for your diabetic testing supplies to [facility] pharmacy.   Gastroesophageal Reflux Disease, Adult Gastroesophageal reflux (GER) happens when acid from the stomach flows up into the tube that connects the mouth and the stomach (esophagus). Normally, food travels down the esophagus and stays in the stomach to be digested. With GER, food and stomach acid sometimes move back up into the esophagus. You may have a disease called gastroesophageal reflux disease (GERD) if the reflux:  Happens often.  Causes frequent or very bad symptoms.  Causes problems such as damage to the esophagus. When this happens, the esophagus becomes sore and swollen (inflamed). Over time, GERD can make small holes (ulcers) in the lining of the esophagus. What are the causes? This condition is caused by a problem with the muscle between the esophagus and the stomach. When this muscle is weak or not normal, it  does not close properly to keep food and acid from coming back up from the stomach. The muscle can be weak because of:  Tobacco use.  Pregnancy.  Having a certain type of hernia (hiatal hernia).  Alcohol use.  Certain foods and drinks, such as coffee, chocolate, onions, and peppermint. What increases the risk? You are more likely to develop this condition if you:  Are overweight.  Have a disease that affects your connective tissue.  Use NSAID medicines. What are the signs or symptoms? Symptoms of this condition include:  Heartburn.  Difficult or painful swallowing.  The feeling of having a lump in the throat.  A bitter taste in the mouth.  Bad breath.  Having a lot of saliva.  Having an upset or bloated stomach.  Belching.  Chest pain. Different conditions can cause chest pain. Make sure you see your doctor if you have chest pain.  Shortness of breath or noisy breathing (wheezing).  Ongoing (chronic) cough or a cough at night.  Wearing away of the surface of teeth (tooth enamel).  Weight loss. How is this treated? Treatment will depend on how bad your symptoms are. Your doctor may suggest:  Changes to your diet.  Medicine.  Surgery. Follow these instructions at home: Eating and drinking   Follow a diet as told by your doctor. You may need to avoid foods and drinks such as: ? Coffee and tea (with or without caffeine). ? Drinks that contain alcohol. ? Energy drinks  and sports drinks. ? Bubbly (carbonated) drinks or sodas. ? Chocolate and cocoa. ? Peppermint and mint flavorings. ? Garlic and onions. ? Horseradish. ? Spicy and acidic foods. These include peppers, chili powder, curry powder, vinegar, hot sauces, and BBQ sauce. ? Citrus fruit juices and citrus fruits, such as oranges, lemons, and limes. ? Tomato-based foods. These include red sauce, chili, salsa, and pizza with red sauce. ? Fried and fatty foods. These include donuts, french fries,  potato chips, and high-fat dressings. ? High-fat meats. These include hot dogs, rib eye steak, sausage, ham, and bacon. ? High-fat dairy items, such as whole milk, butter, and cream cheese.  Eat small meals often. Avoid eating large meals.  Avoid drinking large amounts of liquid with your meals.  Avoid eating meals during the 2-3 hours before bedtime.  Avoid lying down right after you eat.  Do not exercise right after you eat. Lifestyle   Do not use any products that contain nicotine or tobacco. These include cigarettes, e-cigarettes, and chewing tobacco. If you need help quitting, ask your doctor.  Try to lower your stress. If you need help doing this, ask your doctor.  If you are overweight, lose an amount of weight that is healthy for you. Ask your doctor about a safe weight loss goal. General instructions  Pay attention to any changes in your symptoms.  Take over-the-counter and prescription medicines only as told by your doctor. Do not take aspirin, ibuprofen, or other NSAIDs unless your doctor says it is okay.  Wear loose clothes. Do not wear anything tight around your waist.  Raise (elevate) the head of your bed about 6 inches (15 cm).  Avoid bending over if this makes your symptoms worse.  Keep all follow-up visits as told by your doctor. This is important. Contact a doctor if:  You have new symptoms.  You lose weight and you do not know why.  You have trouble swallowing or it hurts to swallow.  You have wheezing or a cough that keeps happening.  Your symptoms do not get better with treatment.  You have a hoarse voice. Get help right away if:  You have pain in your arms, neck, jaw, teeth, or back.  You feel sweaty, dizzy, or light-headed.  You have chest pain or shortness of breath.  You throw up (vomit) and your throw-up looks like blood or coffee grounds.  You pass out (faint).  Your poop (stool) is bloody or black.  You cannot swallow, drink, or  eat. Summary  If a person has gastroesophageal reflux disease (GERD), food and stomach acid move back up into the esophagus and cause symptoms or problems such as damage to the esophagus.  Treatment will depend on how bad your symptoms are.  Follow a diet as told by your doctor.  Take all medicines only as told by your doctor. This information is not intended to replace advice given to you by your health care provider. Make sure you discuss any questions you have with your health care provider. Document Revised: 02/25/2018 Document Reviewed: 02/25/2018 Elsevier Patient Education  2020 Elsevier Inc.   Pneumococcal Conjugate Vaccine (PCV13): What You Need to Know 1. Why get vaccinated? Pneumococcal conjugate vaccine (PCV13) can prevent pneumococcal disease. Pneumococcal disease refers to any illness caused by pneumococcal bacteria. These bacteria can cause many types of illnesses, including pneumonia, which is an infection of the lungs. Pneumococcal bacteria are one of the most common causes of pneumonia. Besides pneumonia, pneumococcal bacteria can also  cause:  Ear infections  Sinus infections  Meningitis (infection of the tissue covering the brain and spinal cord)  Bacteremia (bloodstream infection) Anyone can get pneumococcal disease, but children under 692 years of age, people with certain medical conditions, adults 65 years or older, and cigarette smokers are at the highest risk. Most pneumococcal infections are mild. However, some can result in long-term problems, such as brain damage or hearing loss. Meningitis, bacteremia, and pneumonia caused by pneumococcal disease can be fatal. 2. PCV13 PCV13 protects against 13 types of bacteria that cause pneumococcal disease. Infants and young children usually need 4 doses of pneumococcal conjugate vaccine, at 2, 4, 6, and 8912-5915 months of age. In some cases, a child might need fewer than 4 doses to complete PCV13 vaccination. A dose of  PCV23 vaccine is also recommended for anyone 2 years or older with certain medical conditions if they did not already receive PCV13. This vaccine may be given to adults 65 years or older based on discussions between the patient and health care provider. 3. Talk with your health care provider Tell your vaccine provider if the person getting the vaccine:  Has had an allergic reaction after a previous dose of PCV13, to an earlier pneumococcal conjugate vaccine known as PCV7, or to any vaccine containing diphtheria toxoid (for example, DTaP), or has any severe, life-threatening allergies.  In some cases, your health care provider may decide to postpone PCV13 vaccination to a future visit. People with minor illnesses, such as a cold, may be vaccinated. People who are moderately or severely ill should usually wait until they recover before getting PCV13. Your health care provider can give you more information. 4. Risks of a vaccine reaction  Redness, swelling, pain, or tenderness where the shot is given, and fever, loss of appetite, fussiness (irritability), feeling tired, headache, and chills can happen after PCV13. Young children may be at increased risk for seizures caused by fever after PCV13 if it is administered at the same time as inactivated influenza vaccine. Ask your health care provider for more information. People sometimes faint after medical procedures, including vaccination. Tell your provider if you feel dizzy or have vision changes or ringing in the ears. As with any medicine, there is a very remote chance of a vaccine causing a severe allergic reaction, other serious injury, or death. 5. What if there is a serious problem? An allergic reaction could occur after the vaccinated person leaves the clinic. If you see signs of a severe allergic reaction (hives, swelling of the face and throat, difficulty breathing, a fast heartbeat, dizziness, or weakness), call 9-1-1 and get the person to the  nearest hospital. For other signs that concern you, call your health care provider. Adverse reactions should be reported to the Vaccine Adverse Event Reporting System (VAERS). Your health care provider will usually file this report, or you can do it yourself. Visit the VAERS website at www.vaers.LAgents.nohhs.gov or call 909-190-77171-418 435 8002. VAERS is only for reporting reactions, and VAERS staff do not give medical advice. 6. The National Vaccine Injury Compensation Program The [employer] Vaccine Injury Compensation Program (VICP) is a federal program that was created to compensate people who may have been injured by certain vaccines. Visit the VICP website at SpiritualWord.atwww.hrsa.gov/vaccinecompensation or call (802) 784-53331-204-802-7077 to learn about the program and about filing a claim. There is a time limit to file a claim for compensation. 7. How can I learn more?  Ask your health care provider.  Call your local or state health department.  Contact the Centers for Disease Control and Prevention (CDC): ? Call (601)461-9516 (1-800-CDC-INFO) or ? Visit CDC's website at PicCapture.uy Vaccine Information Statement PCV13 Vaccine (07/01/2018) This information is not intended to replace advice given to you by your health care provider. Make sure you discuss any questions you have with your health care provider. Document Revised: 12/08/2018 Document Reviewed: 03/31/2018 Elsevier Patient Education  2020 [employer].

## 2020-04-24 ENCOUNTER — Other Ambulatory Visit: Payer: Self-pay | Admitting: Internal Medicine

## 2020-04-24 DIAGNOSIS — R918 Other nonspecific abnormal finding of lung field: Secondary | ICD-10-CM

## 2020-04-24 NOTE — Progress Notes (Signed)
Please let patient's daughter know that the chest x-ray revealed that she has a persistent abnormal area in the right lung field.  The radiologist recommends doing a more detail imaging study called a CAT scan to evaluate this area further.  Please schedule for patient.

## 2020-05-02 ENCOUNTER — Telehealth: Payer: Self-pay

## 2020-05-02 NOTE — Telephone Encounter (Signed)
Contacted pt daughter to go over xray results. Pt daughter is aware. Was giving daughter CT appt info she would like for me to call back a lvm with info  Returned call and lvm infroming pt daughter of Ct appt   CT is scheduled for 9/15 at 4pm at [facility]. Pt is to arrive at 345pm. NPO 4 hours prior

## 2020-05-17 ENCOUNTER — Ambulatory Visit (HOSPITAL_COMMUNITY): Payer: Medicaid Other | Attending: Internal Medicine

## 2020-05-23 ENCOUNTER — Encounter: Payer: Self-pay | Admitting: Internal Medicine

## 2020-05-23 NOTE — Progress Notes (Signed)
Received from [facility] health department.  Patient had 3 sputum specimen negative for Mycobacterium tuberculosis.  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  According to up-to-date, the majority of M.fortuitum respiratory isolates are found in individuals with underlying pulmonary disease such as bronchiectasis and represent colonization or transit infection that does not require treatment.

## 2020-09-29 ENCOUNTER — Ambulatory Visit: Payer: Medicare (Managed Care) | Attending: Internal Medicine | Admitting: Internal Medicine

## 2020-09-29 ENCOUNTER — Other Ambulatory Visit: Payer: Self-pay | Admitting: Internal Medicine

## 2020-09-29 ENCOUNTER — Other Ambulatory Visit: Payer: Self-pay

## 2020-09-29 ENCOUNTER — Encounter: Payer: Self-pay | Admitting: Internal Medicine

## 2020-09-29 VITALS — BP 178/92 | HR 81 | Temp 98.3°F | Resp 16 | Wt 122.4 lb

## 2020-09-29 DIAGNOSIS — K625 Hemorrhage of anus and rectum: Secondary | ICD-10-CM

## 2020-09-29 DIAGNOSIS — I10 Essential (primary) hypertension: Secondary | ICD-10-CM | POA: Diagnosis not present

## 2020-09-29 DIAGNOSIS — F0391 Unspecified dementia with behavioral disturbance: Secondary | ICD-10-CM | POA: Diagnosis not present

## 2020-09-29 DIAGNOSIS — E118 Type 2 diabetes mellitus with unspecified complications: Secondary | ICD-10-CM

## 2020-09-29 DIAGNOSIS — M545 Low back pain, unspecified: Secondary | ICD-10-CM

## 2020-09-29 DIAGNOSIS — K5900 Constipation, unspecified: Secondary | ICD-10-CM

## 2020-09-29 DIAGNOSIS — K219 Gastro-esophageal reflux disease without esophagitis: Secondary | ICD-10-CM

## 2020-09-29 LAB — POCT GLYCOSYLATED HEMOGLOBIN (HGB A1C): HbA1c, POC (controlled diabetic range): 12 % — AB (ref 0.0–7.0)

## 2020-09-29 LAB — GLUCOSE, POCT (MANUAL RESULT ENTRY): POC Glucose: 353 mg/dl — AB (ref 70–99)

## 2020-09-29 MED ORDER — FERROUS SULFATE 325 (65 FE) MG PO TABS: 325.0000 mg | ORAL_TABLET | Freq: Every day | ORAL | 1 refills | Status: DC

## 2020-09-29 MED ORDER — POLYETHYLENE GLYCOL 3350 17 GM/SCOOP PO POWD: 17.0000 g | Freq: Two times a day (BID) | ORAL | 1 refills | Status: DC | PRN

## 2020-09-29 MED ORDER — LOSARTAN POTASSIUM 100 MG PO TABS: 100.0000 mg | ORAL_TABLET | Freq: Every day | ORAL | 5 refills | Status: DC

## 2020-09-29 MED ORDER — NOVOLIN 70/30 FLEXPEN RELION (70-30) 100 UNIT/ML ~~LOC~~ SUPN
6.0000 [IU] | PEN_INJECTOR | Freq: Two times a day (BID) | SUBCUTANEOUS | 6 refills | Status: DC
Start: 2020-09-29 — End: 2020-09-29

## 2020-09-29 MED ORDER — DONEPEZIL HCL 5 MG PO TABS: 5.0000 mg | ORAL_TABLET | Freq: Every day | ORAL | 3 refills | Status: DC

## 2020-09-29 MED ORDER — METFORMIN HCL 500 MG PO TABS: 500.0000 mg | ORAL_TABLET | Freq: Two times a day (BID) | ORAL | 5 refills | Status: DC

## 2020-09-29 MED ORDER — OMEPRAZOLE 20 MG PO CPDR: 20.0000 mg | DELAYED_RELEASE_CAPSULE | Freq: Every day | ORAL | 6 refills | Status: DC

## 2020-09-29 MED ORDER — INSULIN PEN NEEDLE 31G X 5 MM MISC: 0 refills | Status: DC

## 2020-09-29 MED ORDER — METOPROLOL TARTRATE 25 MG PO TABS: 25.0000 mg | ORAL_TABLET | Freq: Two times a day (BID) | ORAL | 5 refills | Status: DC

## 2020-09-29 MED FILL — OMEPRAZOLE 20 MG CAP: 20 | 90 days supply | Qty: 90 | Fill #0

## 2020-09-29 MED FILL — METFORMIN HCL 500 MG TABS: 500 | 90 days supply | Qty: 180 | Fill #0

## 2020-09-29 MED FILL — DONEPEZIL HCL 5 MG TABLET: 5 | 90 days supply | Qty: 90 | Fill #0

## 2020-09-29 MED FILL — LOSARTAN POTASSIUM 100 MG T: 100 | 90 days supply | Qty: 90 | Fill #0

## 2020-09-29 MED FILL — METOPROLOL TARTRATE 25 MG T: 25 | 90 days supply | Qty: 180 | Fill #0

## 2020-09-29 MED FILL — NOVOLIN 70/30 FLEXPEN (70-3: (70-30) 100 | 75 days supply | Qty: 9 | Fill #0

## 2020-09-29 NOTE — Patient Instructions (Signed)
[email or web address]">  Dementia Caregiver Guide Dementia is a term used to describe a number of symptoms that affect memory and thinking. The most common symptoms include:  Memory loss.  Trouble with language and communication.  Trouble concentrating.  Poor judgment and problems with reasoning.  Wandering from home or public places.  Extreme anxiety or depression.  Being suspicious or having angry outbursts and accusations.  Child-like behavior and language. Dementia can be frightening and confusing. And taking care of someone with dementia can be challenging. This guide provides tips to help you when providing care for a person with dementia. How to help manage lifestyle changes Dementia usually gets worse slowly over time. In the early stages, people with dementia can stay independent and safe with some help. In later stages, they need help with daily tasks such as dressing, grooming, and using the bathroom. There are actions you can take to help a person manage his or her life while living with this condition. Communicating  When the person is talking or seems frustrated, make eye contact and hold the person's hand.  Ask specific questions that need yes or no answers.  Use simple words, short sentences, and a calm voice. Only give one direction at a time.  When offering choices, limit the person to just one or two.  Avoid correcting the person in a negative way.  If the person is struggling to find the right words, gently try to help him or her. Preventing injury  Keep floors clear of clutter. Remove rugs, magazine racks, and floor lamps.  Keep hallways well lit, especially at night.  Put a handrail and nonslip mat in the bathtub or shower.  Put childproof locks on cabinets that contain dangerous items, such as medicines, alcohol, guns, toxic cleaning items, sharp tools or utensils, matches, and lighters.  For doors to the  outside of the house, put the locks in places where the person cannot see or reach them easily. This will help ensure that the person does not wander out of the house and get lost.  Be prepared for emergencies. Keep a list of emergency phone numbers and addresses in a convenient area.  Remove car keys and lock garage doors so that the person does not try to get in the car and drive.  Have the person wear a bracelet that tracks locations and identifies the person as having memory problems. This should be worn at all times for safety.   Helping with daily life  Keep the person on track with his or her routine.  Try to identify areas where the person may need help.  Be supportive, patient, calm, and encouraging.  Gently remind the person that adjusting to changes takes time.  Help with the tasks that the person has asked for help with.  Keep the person involved in daily tasks and decisions as much as possible.  Encourage conversation, but try not to get frustrated if the person struggles to find words or does not seem to appreciate your help.   How to recognize stress Look for signs of stress in yourself and in the person you are caring for. If you notice signs of stress, take steps to manage it. Symptoms of stress include:  Feeling anxious, irritable, frustrated, or angry.  Denying that the person has dementia or that his or her symptoms will not improve.  Feeling depressed, hopeless, or unappreciated.  Difficulty sleeping.  Difficulty concentrating.  Developing stress-related health problems.  Feeling like you have too little time   for your own life. Follow these instructions at home: Take care of your health Make sure that you and the person you are caring for:  Get regular sleep.  Exercise regularly.  Eat regular, nutritious meals.  Take over-the-counter and prescription medicines only as told by your health care providers.  Drink enough fluid to keep your urine pale  yellow.  Attend all scheduled health care appointments.   General instructions  Join a support group with others who are caregivers.  Ask about respite care resources. Respite care can provide short-term care for the person so that you can have a regular break from the stress of caregiving.  Consider any safety risks and take steps to avoid them.  Organize medicines in a pill box for each day of the week.  Create a plan to handle any legal or financial matters. Get legal or financial advice if needed.  Keep a calendar in a central location to remind the person of appointments or other activities. Where to find support: Many individuals and organizations offer support. These include:  Support groups for people with dementia.  Support groups for caregivers.  Counselors or therapists.  Home health care services.  Adult day care centers. Where to find more information  Centers for Disease Control and Prevention: www.cdc.gov  Alzheimer's Association: www.alz.org  Family Caregiver Alliance: www.caregiver.org  Alzheimer's Foundation of America: www.alzfdn.org Contact a health care provider if:  The person's health is rapidly getting worse.  You are no longer able to care for the person.  Caring for the person is affecting your physical and emotional health.  You are feeling depressed or anxious about caring for the person. Get help right away if:  The person threatens himself or herself, you, or anyone else.  You feel depressed or sad, or feel that you want to harm yourself. If you ever feel like your loved one may hurt himself or herself or others, or if he or she shares thoughts about taking his or her own life, get help right away. You can go to your nearest emergency department or:  Call your local emergency services (911 in the [place]  Call a suicide crisis helpline, such as the National Suicide Prevention Lifeline at 1-800-273-8255. This is open 24 hours a day in  the [place]  Text the Crisis Text Line at 741741 (in the [place] Summary  Dementia is a term used to describe a number of symptoms that affect memory and thinking.  Dementia usually gets worse slowly over time.  Take steps to reduce the person's risk of injury and to plan for future care.  Caregivers need support, relief from caregiving, and time for their own lives. This information is not intended to replace advice given to you by your health care provider. Make sure you discuss any questions you have with your health care provider. Document Revised: 01/03/2020 Document Reviewed: 01/03/2020 Elsevier Patient Education  2021 Elsevier Inc.  

## 2020-09-29 NOTE — Progress Notes (Signed)
Patient ID: Julie Colon, female    DOB: [birth year]  MRN: [id number]  CC: Diabetes, Hypertension, and Memory Loss   Subjective: Julie Colon is a 87 y.o. female who presents for chronic ds management.  Daughter Lilyan Punt is with her and interprets.   Her concerns today include:  Patient with history of DM type II, HTN, asthma, GERD, noncompliance,  Daughter concern about pt's memory  She reports that patient has been more forgetful over several months.  She repeats herself and ask about something that may have been discussed 5 minutes ago.  She has had several episodes of leaving the water running in the sink and forgetting to turn the stove off. Her daughter does not allow her to cook anymore.  She tries to wonder off outside the house.  One time her daughter left her in the car in a parking lot for a few minutes.  When she came back the patient had wandered off down the street. Her daughter has installed cameras in the house and also has an alarm system so that she is alerted if she tries to open the door at nights.  Lately she has been confused thinking that she is in someone else's bedroom/house when she is in her own bedroom at home. Daughter states she cries during these episodes -Daughter works during the day and patient's grandchildren are at school.  Patient is home alone during the day and her daughter is concerned about it so she got the cameras to monitor her in the house  HTN: Blood pressure elevated today.  Did not take meds as yet for today.  She does have a med box.  Daughter has a blood pressure device but has not been checking her blood pressure recently.  DM:  Results for orders placed or performed in visit on 09/29/20  CBC  Result Value Ref Range   WBC 6.8 3.4 - 10.8 x10E3/uL   RBC 4.81 3.77 - 5.28 x10E6/uL   Hemoglobin 14.3 11.1 - 15.9 g/dL   Hematocrit 43.2 34.0 - 46.6 %   MCV 90 79 - 97 fL   MCH 29.7 26.6 - 33.0 pg   MCHC 33.1 31.5 - 35.7 g/dL   RDW 11.2 (L) 11.7  - 15.4 %   Platelets 286 150 - 450 x10E3/uL  Comprehensive metabolic panel  Result Value Ref Range   Glucose 361 (H) 65 - 99 mg/dL   BUN 12 8 - 27 mg/dL   Creatinine, Ser 0.73 0.57 - 1.00 mg/dL   GFR calc non Af Amer 76 >59 mL/min/1.73   GFR calc Af Amer 87 >59 mL/min/1.73   BUN/Creatinine Ratio 16 12 - 28   Sodium 139 134 - 144 mmol/L   Potassium 5.0 3.5 - 5.2 mmol/L   Chloride 98 96 - 106 mmol/L   CO2 28 20 - 29 mmol/L   Calcium 9.6 8.7 - 10.3 mg/dL   Total Protein 7.2 6.0 - 8.5 g/dL   Albumin 4.0 3.6 - 4.6 g/dL   Globulin, Total 3.2 1.5 - 4.5 g/dL   Albumin/Globulin Ratio 1.3 1.2 - 2.2   Bilirubin Total 0.3 0.0 - 1.2 mg/dL   Alkaline Phosphatase 106 44 - 121 IU/L   AST 14 0 - 40 IU/L   ALT 9 0 - 32 IU/L  TSH  Result Value Ref Range   TSH 0.727 0.450 - 4.500 uIU/mL  Vitamin B12  Result Value Ref Range   Vitamin B-12 1,079 232 - 1,245 pg/mL  POCT glucose (  manual entry)  Result Value Ref Range   POC Glucose 353 (A) 70 - 99 mg/dl  POCT glycosylated hemoglobin (Hb A1C)  Result Value Ref Range   Hemoglobin A1C     HbA1c POC (<> result, manual entry)     HbA1c, POC (prediabetic range)     HbA1c, POC (controlled diabetic range) 12.0 (A) 0.0 - 7.0 %  A1c is elevated today much more so than it was on last visit.  Daughter states that she has forgotten how to check her blood sugars.  She thinks the patient has been administering her insulin twice a day as prescribed but is not sure.  Patient is also supposed to be on Metformin.  Daughter reports that her appetite is good.  She drinks water and cranberry juice.  She also likes tea with sugar in it.  Patient reports that bowel movements have been hard.  When asked whether she noticed any blood in the stools, patient stated yes. She has a bowel movement about every 3 days.  She feels bloated.  Found to have stable mild to moderate anemia last year.  Iron studies c/w iron def.  Prescribed Iron supplement. Discussed pursing GI work up at  that time and pt stated she was willing to do it if necessary.    Patient Active Problem List   Diagnosis Date Noted  . Gastroesophageal reflux disease without esophagitis 04/20/2020  . Iron deficiency 04/20/2020  . 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 Essential hypertension 03/20/2020  . Hyponatremia 03/02/2020  . Sepsis due to pneumonia ([place]) 03/02/2020  . Noncompliance 03/02/2020  . Leukocytosis 03/02/2020  . Advanced age 15/09/2019  . Pneumonia 03/02/2020  . Hyperglycemic hyperosmolar nonketotic coma ([place]) 01/14/2020  . Type 2 diabetes mellitus with complication, without long-term current use of insulin ([place]) 01/14/2020  . Hyperkalemia 01/14/2020  . AKI (acute kidney injury) ([place]) 01/14/2020     Current Outpatient Medications on File Prior to Visit  Medication Sig Dispense Refill  . Accu-Chek FastClix Lancets MISC UAD 100 each 1  . Blood Glucose Monitoring Suppl (ACCU-CHEK GUIDE ME) w/Device KIT UAD 1 kit 0  . bumetanide (BUMEX) 1 MG tablet Take 1 tablet (1 mg total) by mouth daily. 30 tablet 0  . glucose blood (ACCU-CHEK GUIDE) test strip Check BS once a day 100 each 12   No current facility-administered medications on file prior to visit.    No Known Allergies  Social History   Socioeconomic History  . Marital status: Single    Spouse name: Not on file  . Number of children: Not on file  . Years of education: Not on file  . Highest education level: Not on file  Occupational History  . Not on file  Tobacco Use  . Smoking status: Never Smoker  . Smokeless tobacco: Never Used  Vaping Use  . Vaping Use: Never used  Substance and Sexual Activity  . Alcohol use: No  . Drug use: No  . Sexual activity: Not Currently  Other Topics Concern  . Not on file  Social History Narrative   Speaks [place] (from the [place])   [place] to [facility] May 1. 2021 from [place].  Daughter not well versed in mother's care yet   Social Determinants of Health   Financial Resource Strain:  Not on file  Food Insecurity: Not on file  Transportation Needs: Not on file  Physical Activity: Not on file  Stress: Not on file  Social Connections: Not on file  Intimate Partner  Violence: Not on file    Family History  Problem Relation Age of Onset  . Diabetes Mother     No past surgical history on file.  ROS: Review of Systems Negative except as stated above  PHYSICAL EXAM: BP (!) 178/92   Pulse 81   Temp 98.3 F (36.8 C)   Resp 16   Wt 122 lb 6.4 oz (55.5 kg)   SpO2 95%   BMI 24.72 kg/m   Wt Readings from Last 3 Encounters:  09/29/20 122 lb 6.4 oz (55.5 kg)  04/20/20 133 lb 12.8 oz (60.7 kg)  03/20/20 134 lb 3.2 oz (60.9 kg)    Physical Exam  General appearance - alert, well appearing, elderly female and in no distress Mental status -patient knows the day of the week but not the month or the year.  Mini-Mental status exam results as shown below.   Mouth - mucous membranes moist, pharynx normal without lesions Neck - supple, no significant adenopathy Chest - clear to auscultation, no wheezes, rales or rhonchi, symmetric air entry Heart - normal rate, regular rhythm, normal S1, S2, no murmurs, rubs, clicks or gallops Abdomen: Normal bowel sounds, nondistended.  Soft, nontender, no organomegaly Rectal -no external rectal lesions.  She has minimal stool in the rectal vault.  Stool brown.  Hemoccult negative. Extremities -no lower extremity edema  MMSE - Mini Mental State Exam 09/29/2020  Orientation to time 0  Orientation to Place 0  Registration 3  Attention/ Calculation 0  Recall 2  Language- name 2 objects 1  Language- repeat 0  Language- follow 3 step command 3  Language- read & follow direction 1  Write a sentence 0  Copy design 0  Total score 10   Depression screen [facility] 2/9 09/29/2020 04/20/2020 03/20/2020  Decreased Interest 2 0 2  Down, Depressed, Hopeless 2 0 1  PHQ - 2 Score 4 0 3  Altered sleeping 3 0 0  Tired, decreased energy 3 0 0  Change  in appetite 2 0 0  Feeling bad or failure about yourself  2 0 0  Trouble concentrating 3 1 0  Moving slowly or fidgety/restless 2 1 0  Suicidal thoughts 3 0 0  PHQ-9 Score $RemoveBef'22 2 3    'eepPigLQMQ$ CMP Latest Ref Rng & Units 09/29/2020 03/20/2020 03/03/2020  Glucose 65 - 99 mg/dL 361(H) 128(H) 163(H)  BUN 8 - 27 mg/dL $Remove'12 14 10  'BzlJUdF$ Creatinine 0.57 - 1.00 mg/dL 0.73 0.62 0.69  Sodium 134 - 144 mmol/L 139 137 137  Potassium 3.5 - 5.2 mmol/L 5.0 5.3(H) 3.8  Chloride 96 - 106 mmol/L 98 101 100  CO2 20 - 29 mmol/L $RemoveB'28 26 24  'XwCUHoYB$ Calcium 8.7 - 10.3 mg/dL 9.6 9.5 9.0  Total Protein 6.0 - 8.5 g/dL 7.2 7.5 7.5  Total Bilirubin 0.0 - 1.2 mg/dL 0.3 0.4 0.7  Alkaline Phos 44 - 121 IU/L 106 67 58  AST 0 - 40 IU/L $Remov'14 16 18  'uTYOYr$ ALT 0 - 32 IU/L $Remov'9 8 11   'mHKwhV$ Lipid Panel  No results found for: CHOL, TRIG, HDL, CHOLHDL, VLDL, LDLCALC, LDLDIRECT  CBC    Component Value Date/Time   WBC 6.8 09/29/2020 1140   WBC 10.0 03/04/2020 0356   RBC 4.81 09/29/2020 1140   RBC 3.78 (L) 03/04/2020 0356   HGB 14.3 09/29/2020 1140   HCT 43.2 09/29/2020 1140   PLT 286 09/29/2020 1140   MCV 90 09/29/2020 1140   MCH 29.7 09/29/2020 1140   MCH 27.0  03/04/2020 0356   MCHC 33.1 09/29/2020 1140   MCHC 30.9 03/04/2020 0356   RDW 11.2 (L) 09/29/2020 1140   LYMPHSABS 3.0 03/20/2020 1135   MONOABS 0.6 04/25/2016 1340   EOSABS 0.3 03/20/2020 1135   BASOSABS 0.0 03/20/2020 1135    ASSESSMENT AND PLAN:  1. Dementia with behavioral disturbance, unspecified dementia type ([place]) Likely [place] type. Will do dementia work up. Discussed starting her on low dose Aricept.  Advise pt and daughter that med can cause dizziness sometimes.  If this occurs once she starts the med, please stop it and let me know.  Informed that med will not reverse the process of dementia. Also has + depression screen so she may benefit from low does SSRI like Zoloft.  Discussed ways to help with orientation.  Est daily routines and keep large calendar on fridge or in her bedroom.   Announce every morning the date of the wk, mth and yr to her. Recommend that someone is with her 24/7.  Daughter works during the day.  Discussed PCS vs PACE.  Daughter interested in learning about both.  Will have CW call her next week - CBC - Comprehensive metabolic panel - TSH - Vitamin B12 - CT Head Wo Contrast; Future - donepezil (ARICEPT) 5 MG tablet; Take 1 tablet (5 mg total) by mouth at bedtime.  Dispense: 30 tablet; Refill: 3  2. Type 2 diabetes mellitus with complication, without long-term current use of insulin ([facility]) Not at goal.  I suspect pt has not been taking meds due to dementia.  Advise daughter that she will need to over see her meds. Get med box and set up weekly.  Daughter to administer insulin with BF and dinner. - POCT glucose (manual entry) - POCT glycosylated hemoglobin (Hb A1C) - metFORMIN (GLUCOPHAGE) 500 MG tablet; Take 1 tablet (500 mg total) by mouth 2 (two) times daily with a meal.  Dispense: 60 tablet; Refill: 5 - insulin isophane & regular human (NOVOLIN 70/30 FLEXPEN RELION) (70-30) 100 UNIT/ML KwikPen; Inject 6 Units into the skin 2 (two) times daily.  Dispense: 5 mL; Refill: 6  3. Essential hypertension Not at goal.  Again advise daughter to oversee her taking her meds - losartan (COZAAR) 100 MG tablet; Take 1 tablet (100 mg total) by mouth daily.  Dispense: 30 tablet; Refill: 5 - metoprolol tartrate (LOPRESSOR) 25 MG tablet; Take 1 tablet (25 mg total) by mouth 2 (two) times daily.  Dispense: 60 tablet; Refill: 5  4. Constipation, unspecified constipation type Recommend increase fiber in diet.  Daughter has Miralax at home.  Recommend giving it to her 2-3 times a wk PRN  5. Blood per rectum Refer to GI.  Recheck CBC  6. Gastroesophageal reflux disease without esophagitis - omeprazole (PRILOSEC) 20 MG capsule; Take 1 capsule (20 mg total) by mouth daily.  Dispense: 30 capsule; Refill: 6  7. Acute bilateral low back pain without sciatica - Urinalysis,  Routine w reflex microscopic - Ambulatory referral to Gastroenterology    Patient was given the opportunity to ask questions.  Patient verbalized understanding of the plan and was able to repeat key elements of the plan.   Orders Placed This Encounter  Procedures  . CT Head Wo Contrast  . CBC  . Comprehensive metabolic panel  . TSH  . Vitamin B12  . Urinalysis, Routine w reflex microscopic  . Ambulatory referral to Gastroenterology  . POCT glucose (manual entry)  . POCT glycosylated hemoglobin (Hb A1C)  Requested Prescriptions   Signed Prescriptions Disp Refills  . polyethylene glycol powder (GLYCOLAX/MIRALAX) 17 GM/SCOOP powder 3350 g 1    Sig: Take 17 g by mouth 2 (two) times daily as needed.  Marland Kitchen losartan (COZAAR) 100 MG tablet 30 tablet 5    Sig: Take 1 tablet (100 mg total) by mouth daily.  Marland Kitchen omeprazole (PRILOSEC) 20 MG capsule 30 capsule 6    Sig: Take 1 capsule (20 mg total) by mouth daily.  . metoprolol tartrate (LOPRESSOR) 25 MG tablet 60 tablet 5    Sig: Take 1 tablet (25 mg total) by mouth 2 (two) times daily.  . ferrous sulfate 325 (65 FE) MG tablet 100 tablet 1    Sig: Take 1 tablet (325 mg total) by mouth daily with breakfast.  . metFORMIN (GLUCOPHAGE) 500 MG tablet 60 tablet 5    Sig: Take 1 tablet (500 mg total) by mouth 2 (two) times daily with a meal.  . insulin isophane & regular human (NOVOLIN 70/30 FLEXPEN RELION) (70-30) 100 UNIT/ML KwikPen 5 mL 6    Sig: Inject 6 Units into the skin 2 (two) times daily.  . Insulin Pen Needle 31G X 5 MM MISC 100 each 0    Sig: Inject insulin twice daily as directed  . donepezil (ARICEPT) 5 MG tablet 30 tablet 3    Sig: Take 1 tablet (5 mg total) by mouth at bedtime.  . sertraline (ZOLOFT) 50 MG tablet 30 tablet 2    Sig: 1/2 tab daily PO    Return in about 6 weeks (around 11/10/2020).  Karle Plumber, MD, FACP

## 2020-09-30 LAB — COMPREHENSIVE METABOLIC PANEL
ALT: 9 IU/L (ref 0–32)
AST: 14 IU/L (ref 0–40)
Albumin/Globulin Ratio: 1.3 (ref 1.2–2.2)
Albumin: 4 g/dL (ref 3.6–4.6)
Alkaline Phosphatase: 106 IU/L (ref 44–121)
BUN/Creatinine Ratio: 16 (ref 12–28)
BUN: 12 mg/dL (ref 8–27)
Bilirubin Total: 0.3 mg/dL (ref 0.0–1.2)
CO2: 28 mmol/L (ref 20–29)
Calcium: 9.6 mg/dL (ref 8.7–10.3)
Chloride: 98 mmol/L (ref 96–106)
Creatinine, Ser: 0.73 mg/dL (ref 0.57–1.00)
GFR calc Af Amer: 87 mL/min/{1.73_m2} (ref >59)
GFR calc non Af Amer: 76 mL/min/{1.73_m2} (ref >59)
Globulin, Total: 3.2 g/dL (ref 1.5–4.5)
Glucose: 361 mg/dL — ABNORMAL HIGH (ref 65–99)
Potassium: 5 mmol/L (ref 3.5–5.2)
Sodium: 139 mmol/L (ref 134–144)
Total Protein: 7.2 g/dL (ref 6.0–8.5)

## 2020-09-30 LAB — VITAMIN B12: Vitamin B-12: 1079 pg/mL (ref 232–1245)

## 2020-09-30 LAB — CBC
Hematocrit: 43.2 % (ref 34.0–46.6)
Hemoglobin: 14.3 g/dL (ref 11.1–15.9)
MCH: 29.7 pg (ref 26.6–33.0)
MCHC: 33.1 g/dL (ref 31.5–35.7)
MCV: 90 fL (ref 79–97)
Platelets: 286 10*3/uL (ref 150–450)
RBC: 4.81 x10E6/uL (ref 3.77–5.28)
RDW: 11.2 % — ABNORMAL LOW (ref 11.7–15.4)
WBC: 6.8 10*3/uL (ref 3.4–10.8)

## 2020-09-30 LAB — TSH: TSH: 0.727 u[IU]/mL (ref 0.450–4.500)

## 2020-09-30 NOTE — Progress Notes (Signed)
Let patient daughter know that she is no longer anemic. We can stop the iron supplement called ferrous sulfate. Kidney function is good. Liver function normal. Thyroid level and vitamin B12 level normal.

## 2020-10-01 ENCOUNTER — Encounter: Payer: Self-pay | Admitting: Internal Medicine

## 2020-10-01 ENCOUNTER — Other Ambulatory Visit: Payer: Self-pay | Admitting: Internal Medicine

## 2020-10-01 DIAGNOSIS — K5900 Constipation, unspecified: Secondary | ICD-10-CM | POA: Insufficient documentation

## 2020-10-01 DIAGNOSIS — F03918 Unspecified dementia, unspecified severity, with other behavioral disturbance: Secondary | ICD-10-CM | POA: Insufficient documentation

## 2020-10-01 DIAGNOSIS — F0391 Unspecified dementia with behavioral disturbance: Secondary | ICD-10-CM | POA: Insufficient documentation

## 2020-10-01 MED ORDER — SERTRALINE HCL 50 MG PO TABS: ORAL_TABLET | ORAL | 2 refills | Status: DC

## 2020-10-02 MED FILL — TRUEPLUS 5-BEVEL PEN NEEDLE: 31G X 5 MM | 25 days supply | Qty: 100 | Fill #0

## 2020-10-02 MED FILL — SERTRALINE HCL 50 MG TABLET: 50 | 60 days supply | Qty: 30 | Fill #0

## 2020-10-04 ENCOUNTER — Encounter (HOSPITAL_COMMUNITY): Payer: Self-pay

## 2020-10-04 ENCOUNTER — Emergency Department (HOSPITAL_COMMUNITY)
Admission: EM | Admit: 2020-10-04 | Discharge: 2020-10-04 | Disposition: A | Discharge status: Home / Self Care | Payer: Medicare (Managed Care) | Attending: Emergency Medicine | Admitting: Emergency Medicine

## 2020-10-04 ENCOUNTER — Ambulatory Visit: Payer: Self-pay | Admitting: *Deleted

## 2020-10-04 ENCOUNTER — Other Ambulatory Visit: Payer: Self-pay

## 2020-10-04 ENCOUNTER — Telehealth: Payer: Self-pay

## 2020-10-04 DIAGNOSIS — Z5321 Procedure and treatment not carried out due to patient leaving prior to being seen by health care provider: Secondary | ICD-10-CM | POA: Insufficient documentation

## 2020-10-04 DIAGNOSIS — R42 Dizziness and giddiness: Secondary | ICD-10-CM | POA: Insufficient documentation

## 2020-10-04 NOTE — Telephone Encounter (Signed)
Will forward to provider  

## 2020-10-04 NOTE — Telephone Encounter (Signed)
Phone call returned to patient's daughter [name].  Daughter reports that patient has been receiving the Aricept every night for the past 3 nights and was started on the Zoloft last night.  She also gave her another dose of the Zoloft this morning.  She states that patient did not sleep well last night.  This morning patient complains of weakness and has problems standing up to the extent that she had to use her walker which she has not had to use in a while.  Complains of dizziness and also reported some chest pains this morning but the chest pain has resolved.  Daughter feels patient speech is a bit garbled. I asked her to check the patient's blood sugar but she was unable to do so as the glucometer needs a new battery.  I asked her to check patient's blood pressure and the blood pressure was 128/88 with pulse of 78.  Recommendations: -I recommended that [name] takes the patient to the emergency room to be evaluated to make sure that she has not had a stroke. -Recommend holding the Aricept for now as it can sometimes cause dizziness. -Hold Zoloft for 5 days then restart it but give it in the mornings instead of at night. -Recommend she gets a battery for the glucometer as soon as possible so that she can check the patient's blood sugar every day. Daughter expressed understanding of my recommendations and was able to repeat back.  I will have my CMA move up her appointment for later this month.

## 2020-10-04 NOTE — Telephone Encounter (Signed)
Patient's daughter is calling to report that her mother did not sleep last night and is very weak this morning. Patient is able to stand- but not for long. Patient was given new medication last night- Sertraline and she also gave her dose this morning- SE is insomnia. Daughter wants to know if PCP thinks it is the new medication - or if she need to be evaluated for new onset weakness. Advised would send to PCP for review and call back. In office 09/29/20  Reason for Disposition . [1] MODERATE weakness (i.e., interferes with work, school, normal activities) AND [2] cause unknown  (Exceptions: weakness with acute minor illness, or weakness from poor fluid intake)  Answer Assessment - Initial Assessment Questions 1. DESCRIPTION: "Describe how you are feeling."     Not sleeping- not able to walk today 2. SEVERITY: "How bad is it?"  "Can you stand and walk?"   - MILD - Feels weak or tired, but does not interfere with work, school or normal activities   - MODERATE - Able to stand and walk; weakness interferes with work, school, or normal activities   - SEVERE - Unable to stand or walk     Moderate- able to stand- but not for long 3. ONSET:  "When did the weakness begin?"    This morning 4. CAUSE: "What do you think is causing the weakness?"     unsure 5. MEDICINES: "Have you recently started a new medicine or had a change in the amount of a medicine?"     Started new medication last night- Sertraline 50 mg- took half pill last night 6. OTHER SYMPTOMS: "Do you have any other symptoms?" (e.g., chest pain, fever, cough, SOB, vomiting, diarrhea, bleeding, other areas of pain)     Weakness, dizziness 7. PREGNANCY: "Is there any chance you are pregnant?" "When was your last menstrual period?"    n/a  Protocols used: WEAKNESS (GENERALIZED) AND FATIGUE-A-AH

## 2020-10-04 NOTE — Telephone Encounter (Signed)
Contacted pt daughter to schedule a sooner appt with pcp for next week pt daughter didn't answer lvm

## 2020-10-04 NOTE — ED Notes (Signed)
Pt's daughter up to desk stating that she is leaving with the patient. Pt seen leaving ED.

## 2020-10-04 NOTE — ED Triage Notes (Addendum)
Patient had dizziness this am after staring new meds lat night, aricept and sertraline. Patient alert and at baseline, no distress. Patient and daughter report that dizziness nearly resolved now

## 2020-10-04 NOTE — Telephone Encounter (Signed)
At request of Dr Laural Benes, call placed to patient's daughter, [name] # [phone] , to discuss [facility] and [facility] services.  Message left with call back requested to this CM.

## 2020-10-04 NOTE — Telephone Encounter (Signed)
Could you contact pt and schedule  

## 2020-10-05 NOTE — Telephone Encounter (Signed)
Call received from patient's daughter, [name].  She explained the need for assistance with caring for her mother during the day while she works.  She is currently working M/W/F. She said that after 1700 she has family available to help her.  Explained to her about [facility] and [facility] programs. Provided her with the phone number for [facility] and encouraged her to call for additional information. If she decides that program is not what she is looking for, please notify this CM to discuss placing a [facility]CS referral. She stated that she understood.   [name] said that her mother is feeling better today, no dizziness, slept better last night and has been eating.  Scheduled an appointment for her mother to see Dr Laural Benes 10/09/2020.

## 2020-10-09 ENCOUNTER — Ambulatory Visit: Payer: Medicare (Managed Care) | Admitting: Internal Medicine

## 2020-10-10 ENCOUNTER — Ambulatory Visit (HOSPITAL_COMMUNITY): Payer: Medicare (Managed Care)

## 2020-10-16 ENCOUNTER — Ambulatory Visit: Payer: Medicare (Managed Care) | Attending: Internal Medicine

## 2020-10-16 DIAGNOSIS — Z23 Encounter for immunization: Secondary | ICD-10-CM

## 2020-10-16 NOTE — Progress Notes (Signed)
   Covid-19 Vaccination Clinic  Name:  [name]    MRN: [id number] DOB: [birth year]  10/16/2020  Ms. [name] was observed post Covid-19 immunization for 15 minutes without incident. She was provided with Vaccine Information Sheet and instruction to access the V-Safe system.   Ms. [name] was instructed to call 911 with any severe reactions post vaccine: Marland Kitchen Difficulty breathing  . Swelling of face and throat  . A fast heartbeat  . A bad rash all over body  . Dizziness and weakness   Immunizations Administered    Name Date Dose VIS Date Route   PFIZER Comrnaty(Gray TOP) Covid-19 Vaccine 10/16/2020  9:05 AM 0.3 mL 08/10/2020 Intramuscular   Manufacturer: [employer], [employer]   Lot: [id number]   NDC: [phone]

## 2020-11-06 ENCOUNTER — Ambulatory Visit: Payer: Medicare (Managed Care)

## 2020-11-09 ENCOUNTER — Ambulatory Visit: Payer: Medicare (Managed Care) | Admitting: Gastroenterology

## 2020-12-01 ENCOUNTER — Ambulatory Visit: Payer: Medicare (Managed Care) | Admitting: Internal Medicine

## 2020-12-19 ENCOUNTER — Encounter: Payer: Self-pay | Admitting: Nurse Practitioner

## 2021-01-03 ENCOUNTER — Encounter: Payer: Self-pay | Admitting: Nurse Practitioner

## 2021-01-03 ENCOUNTER — Ambulatory Visit (INDEPENDENT_AMBULATORY_CARE_PROVIDER_SITE_OTHER): Payer: Medicare (Managed Care) | Admitting: Nurse Practitioner

## 2021-01-03 ENCOUNTER — Other Ambulatory Visit: Payer: Self-pay

## 2021-01-03 ENCOUNTER — Telehealth: Payer: Self-pay | Admitting: Nurse Practitioner

## 2021-01-03 VITALS — BP 138/90 | HR 76 | Ht 59.0 in | Wt 130.0 lb

## 2021-01-03 DIAGNOSIS — K625 Hemorrhage of anus and rectum: Secondary | ICD-10-CM | POA: Diagnosis not present

## 2021-01-03 DIAGNOSIS — D509 Iron deficiency anemia, unspecified: Secondary | ICD-10-CM

## 2021-01-03 DIAGNOSIS — K5900 Constipation, unspecified: Secondary | ICD-10-CM

## 2021-01-03 MED ORDER — PLENVU 140 G PO SOLR
140.0000 g | ORAL | 0 refills | Status: DC
  Filled 2021-01-03 (×2): qty 1, 1d supply, fill #0

## 2021-01-03 NOTE — Progress Notes (Signed)
Agree with assessment and plan as outlined.  

## 2021-01-03 NOTE — Telephone Encounter (Signed)
[facility] pharmacy called to inform that pt's insurance does not cover Plenvu. They tried with other preps but were having troubles getting them through. They said that pt's insurance is not a common one so they were not sure.

## 2021-01-03 NOTE — Progress Notes (Signed)
ASSESSMENT AND PLAN    # 87 yo female referred for chronic rectal bleeding, constipation and rectal prolapse. Of note she also has history of anemia, see below.  On DRE there was soft light brown stool, no blood, rectal prolapse not appreciated on exam.   --For further evaluation of constipation, rectal bleeding and anemia patient will be scheduled for colonoscopy. The risks and benefits of colonoscopy with possible polypectomy / biopsies were discussed and the patient agrees to proceed.   # History of normocytic anemia diagnosed July 2021. Possibly iron deficiency with ferritin of 27, normal TIBC but low iron saturation.  Started on iron with improvement in hgb from 10.7 to 14.3.  --For evaluation of IDA will arrange for EGD to be done at time of colonoscopy. The risks and benefits of colonoscopy with possible polypectomy / biopsies were discussed and the patient agrees to proceed.   # GERD with pyrosis. Details difficult to obtain. Daughter gives patient Tums as needed for heartburn. Omeprazole on home med list, daughter thinks she is taking it. --Further evaluation of GERD symptoms at time of EGD.    # DM2, poorly controlled. Hgb A1c was 12 in late January 2022   HISTORY OF PRESENT ILLNESS     Chief Complaint : rectal bleeding, constipaiton  Julie Colon is a 87 y.o. female with a past medical history significant for DM2 hypertension,  asthma, dementia. See additional PMH below.   Patient is new to the practice, referred by PCP for evaluation of constipation. Patient speaks [place], Market researcher here to interpret. History taking difficult with language barrier and patient's memory problems. Daughter able to give limited details.   Patient gives a history of constipation with hard stools and straining.  Daughter thinks constipation started a month ago but PCPs January office note mentions problems with hard stool.  Per daughter patient was recently started on new medication(s) for  blood pressure but she doesn't know which one. I saw an ED note from early February where patient went to ED for dizziness after starting Aricept and Zoloft so the timeline of constipation and medication changes is unclear. Her  bowels moving well now after starting Miralax.   Patient has no upper GI complaints. I saw Omeprazole on home med list. Daughter tells me she gives patient Tums as needed for heartburn. Daughter thinks patient is taking daily Omeprazole..   Patient reports chronic rectal bleeding with bowel movements and sometimes in between but none since stools have become soft on Miralax. Daughter mentions that patient had may of had hemorrhoid surgery last year but patient says she didn't. Patient doesn't know if she has every had a colonoscopy.   Data Review:  09/29/2020 TSH 0.72  WBC 6.8, hemoglobin 14.3  Glucose 361 Liver chemistries normal.    Past Medical History:  Diagnosis Date  . Asthma   . Constipation   . Dementia ([place])   . Diabetes mellitus without complication ([place])   . GERD (gastroesophageal reflux disease)   . Hypertension   . Rectal prolapse      No past surgical history on file. Family History  Problem Relation Age of Onset  . Diabetes Mother    Social History   Tobacco Use  . Smoking status: Never Smoker  . Smokeless tobacco: Never Used  Vaping Use  . Vaping Use: Never used  Substance Use Topics  . Alcohol use: No  . Drug use: No   Current Outpatient Medications  Medication Sig Dispense  Refill  . Accu-Chek FastClix Lancets MISC UAD 100 each 1  . Blood Glucose Monitoring Suppl (ACCU-CHEK GUIDE ME) w/Device KIT UAD 1 kit 0  . bumetanide (BUMEX) 1 MG tablet Take 1 tablet (1 mg total) by mouth daily. 30 tablet 0  . donepezil (ARICEPT) 5 MG tablet TAKE 1 TABLET (5 MG TOTAL) BY MOUTH AT BEDTIME. 30 tablet 3  . ferrous sulfate 325 (65 FE) MG tablet TAKE 1 TABLET (325 MG TOTAL) BY MOUTH DAILY WITH BREAKFAST. 100 tablet 1  . glucose blood (ACCU-CHEK  GUIDE) test strip Check BS once a day 100 each 12  . insulin isophane & regular human (HUMULIN 70/30 MIX) (70-30) 100 UNIT/ML KwikPen INJECT 6 UNITS INTO THE SKIN 2 (TWO) TIMES DAILY. 5 mL 6  . Insulin Pen Needle 31G X 5 MM MISC INJECT INSULIN 2 TIMES DAILY AS DIRECTED 100 each 0  . losartan (COZAAR) 100 MG tablet TAKE 1 TABLET (100 MG TOTAL) BY MOUTH DAILY. 30 tablet 5  . metFORMIN (GLUCOPHAGE) 500 MG tablet TAKE 1 TABLET (500 MG TOTAL) BY MOUTH 2 (TWO) TIMES DAILY WITH A MEAL. 60 tablet 5  . metoprolol tartrate (LOPRESSOR) 25 MG tablet TAKE 1 TABLET (25 MG TOTAL) BY MOUTH 2 (TWO) TIMES DAILY. 60 tablet 5  . omeprazole (PRILOSEC) 20 MG capsule TAKE 1 CAPSULE (20 MG TOTAL) BY MOUTH DAILY. 30 capsule 6  . polyethylene glycol powder (GLYCOLAX/MIRALAX) 17 GM/SCOOP powder TAKE 17 G BY MOUTH 2 (TWO) TIMES DAILY AS NEEDED. 3350 g 1  . sertraline (ZOLOFT) 50 MG tablet TAKE 1/2 TABLET BY MOUTH DAILY 30 tablet 2   No current facility-administered medications for this visit.   No Known Allergies   Review of Systems: All systems reviewed and negative except where noted in HPI.    PHYSICAL EXAM :    Wt Readings from Last 3 Encounters:  10/04/20 152 lb (68.9 kg)  09/29/20 122 lb 6.4 oz (55.5 kg)  04/20/20 133 lb 12.8 oz (60.7 kg)    BP 138/90   Pulse 76   Ht $R'4\' 11"'iG$  (1.499 m)   Wt 130 lb (59 kg)   BMI 26.26 kg/m  Constitutional:  Pleasant female in no acute distress. Psychiatric: Normal mood and affect. Behavior is normal. EENT: Pupils normal.  Conjunctivae are normal. No scleral icterus. Neck supple.  Cardiovascular: Normal rate, regular rhythm. No edema Pulmonary/chest: Effort normal and breath sounds normal. No wheezing, rales or rhonchi. Abdominal: Soft, nondistended, nontender. Bowel sounds active throughout. There are no masses palpable. No hepatomegaly. Rectal: no external hemorrhoids. On DRE there was soft light brown stool. No obvious rectal prolapse.  Neurological: Alert and  oriented to person place and time. Skin: Skin is warm and dry. No rashes noted.  Tye Savoy, NP  01/03/2021, 9:35 AM  Cc:  Referring Provider Ladell Pier, MD

## 2021-01-03 NOTE — Patient Instructions (Addendum)
If you are age 85 or older, your body mass index should be between 23-30. Your Body mass index is 26.26 kg/m. If this is out of the aforementioned range listed, please consider follow up with your Primary Care Provider.  If you are age 14 or younger, your body mass index should be between 19-25. Your Body mass index is 26.26 kg/m. If this is out of the aformentioned range listed, please consider follow up with your Primary Care Provider.   You have been scheduled for an endoscopy and colonoscopy. Please follow the written instructions given to you at your visit today. Please pick up your prep supplies at the pharmacy within the next 1-3 days. If you use inhalers (even only as needed), please bring them with you on the day of your procedure.  Continue the Miralax twice a day.  It was a pleasure to see you today!  Thank you for trusting me with your gastrointestinal care!

## 2021-01-04 NOTE — Telephone Encounter (Signed)
Patient coming to pick up sample prep Plenvu kit tomorrow

## 2021-01-04 NOTE — Telephone Encounter (Signed)
Spoke with pt daughter, and she stated that they have not received guidance on what prep she needs because the insurance does not cover Plenvu. Pt procedure is scheduled for Monday 01/08/21 at 11am.

## 2021-01-08 ENCOUNTER — Other Ambulatory Visit: Payer: Self-pay

## 2021-01-08 ENCOUNTER — Encounter: Payer: Self-pay | Admitting: Gastroenterology

## 2021-01-08 ENCOUNTER — Ambulatory Visit: Payer: Medicare (Managed Care) | Admitting: Gastroenterology

## 2021-01-08 VITALS — BP 215/112 | HR 92 | Temp 96.0°F | Ht 59.0 in | Wt 130.0 lb

## 2021-01-08 MED ORDER — SODIUM CHLORIDE 0.9 % IV SOLN: 500.0000 mL | Freq: Once | INTRAVENOUS | Status: DC

## 2021-01-08 NOTE — Progress Notes (Signed)
Spoke with patient and daughter at bedside after they checked in. Her BP is persistently > 200 systolic with diastolic around 120. She states she is compliant with medications at home, had not taken meds this AM prior to procedure. She checks BP at home around once per week and typically is okay however has had some elevations recently. She denies headaches, shortness of breath, or chest pains.  Discussed situation with them, concerned about her HTN today and risk for CVA with anesthesia. Given elective nature of these exams, think it best to postpone her procedures and have her HTN addressed more acutely, they agreed with the plan. Given she is asymptomatic I think okay to go see her PCP today, they plan on going to their office today from our office to adjust her regimen. She should take her regular BP meds otherwise that she has not taken today yet and keep track of BP at home following adjustment per PCP. If chest pains / shortness of breath, headache, etc, would need to go to the ED. They agreed and will reschedule to do this in a few months once BP better controlled.

## 2021-01-08 NOTE — Progress Notes (Signed)
VS by CW  I have reviewed the patient's medical history in detail and updated the computerized patient record.  BP elevated 215/112. Dr Adela Lank and CRNA Juliette Alcide made aware.   Pt procedure is being cancelled and patient is advised to seek medical attention from primary care physician.

## 2021-03-02 ENCOUNTER — Telehealth: Payer: Self-pay

## 2021-03-06 NOTE — Telephone Encounter (Signed)
Pt call to change appt

## 2021-03-16 ENCOUNTER — Telehealth: Payer: Self-pay

## 2021-03-16 NOTE — Telephone Encounter (Signed)
Referral notes sent from [facility], Phone #: [phone], Fax #: [phone]   Notes sent to scheduling

## 2021-03-28 ENCOUNTER — Encounter: Payer: Medicare (Managed Care) | Admitting: Gastroenterology

## 2021-04-10 ENCOUNTER — Telehealth: Payer: Self-pay

## 2021-04-10 NOTE — Telephone Encounter (Signed)
Patient has a schedule appointment with Dr. Herbie Baltimore on 9-29 @ 10 am. Notes have been faxed to the [facility] office.

## 2021-05-31 ENCOUNTER — Ambulatory Visit: Payer: Medicare (Managed Care) | Admitting: Cardiology

## 2021-06-07 ENCOUNTER — Other Ambulatory Visit: Payer: Self-pay | Admitting: Nurse Practitioner

## 2021-06-07 ENCOUNTER — Ambulatory Visit
Admission: RE | Admit: 2021-06-07 | Discharge: 2021-06-07 | Disposition: A | Discharge status: Home / Self Care | Payer: Medicare (Managed Care) | Source: Ambulatory Visit | Attending: Nurse Practitioner | Admitting: Nurse Practitioner

## 2021-06-07 DIAGNOSIS — R0609 Other forms of dyspnea: Secondary | ICD-10-CM

## 2021-06-07 DIAGNOSIS — J45909 Unspecified asthma, uncomplicated: Secondary | ICD-10-CM

## 2021-06-11 ENCOUNTER — Other Ambulatory Visit: Payer: Self-pay | Admitting: Nurse Practitioner

## 2021-06-12 ENCOUNTER — Other Ambulatory Visit: Payer: Self-pay | Admitting: Nurse Practitioner

## 2021-06-12 DIAGNOSIS — Z8611 Personal history of tuberculosis: Secondary | ICD-10-CM

## 2021-06-12 DIAGNOSIS — R911 Solitary pulmonary nodule: Secondary | ICD-10-CM

## 2021-06-12 DIAGNOSIS — Z825 Family history of asthma and other chronic lower respiratory diseases: Secondary | ICD-10-CM

## 2021-06-12 DIAGNOSIS — R0600 Dyspnea, unspecified: Secondary | ICD-10-CM

## 2021-06-27 ENCOUNTER — Other Ambulatory Visit: Payer: Self-pay

## 2021-06-27 ENCOUNTER — Ambulatory Visit
Admission: RE | Admit: 2021-06-27 | Discharge: 2021-06-27 | Disposition: A | Discharge status: Home / Self Care | Payer: Medicare (Managed Care) | Source: Ambulatory Visit | Attending: Nurse Practitioner | Admitting: Nurse Practitioner

## 2021-06-27 DIAGNOSIS — Z8611 Personal history of tuberculosis: Secondary | ICD-10-CM

## 2021-06-27 DIAGNOSIS — R0600 Dyspnea, unspecified: Secondary | ICD-10-CM

## 2021-06-27 DIAGNOSIS — Z825 Family history of asthma and other chronic lower respiratory diseases: Secondary | ICD-10-CM

## 2021-06-27 DIAGNOSIS — R911 Solitary pulmonary nodule: Secondary | ICD-10-CM

## 2021-06-27 MED ORDER — IOPAMIDOL (ISOVUE-300) INJECTION 61%
75.0000 mL | Freq: Once | INTRAVENOUS | Status: AC | PRN
  Administered 2021-06-27: 75 mL via INTRAVENOUS

## 2021-07-09 ENCOUNTER — Other Ambulatory Visit: Payer: Self-pay | Admitting: Nurse Practitioner

## 2021-07-09 ENCOUNTER — Encounter: Payer: Self-pay | Admitting: Cardiology

## 2021-07-10 ENCOUNTER — Ambulatory Visit: Payer: Medicare (Managed Care) | Admitting: Cardiology

## 2021-07-10 ENCOUNTER — Other Ambulatory Visit: Payer: Self-pay | Admitting: Nurse Practitioner

## 2021-07-10 DIAGNOSIS — Z8611 Personal history of tuberculosis: Secondary | ICD-10-CM

## 2021-07-30 ENCOUNTER — Other Ambulatory Visit: Payer: Medicare (Managed Care)

## 2021-08-07 IMAGING — DX DG CHEST 1V PORT
1 series · 1 of 1 positions shown · non-contrast
Comparison: None.

CLINICAL DATA: Altered mental status. Generalized weakness.
Fatigue.

EXAM:
PORTABLE CHEST 1 VIEW

[chest]
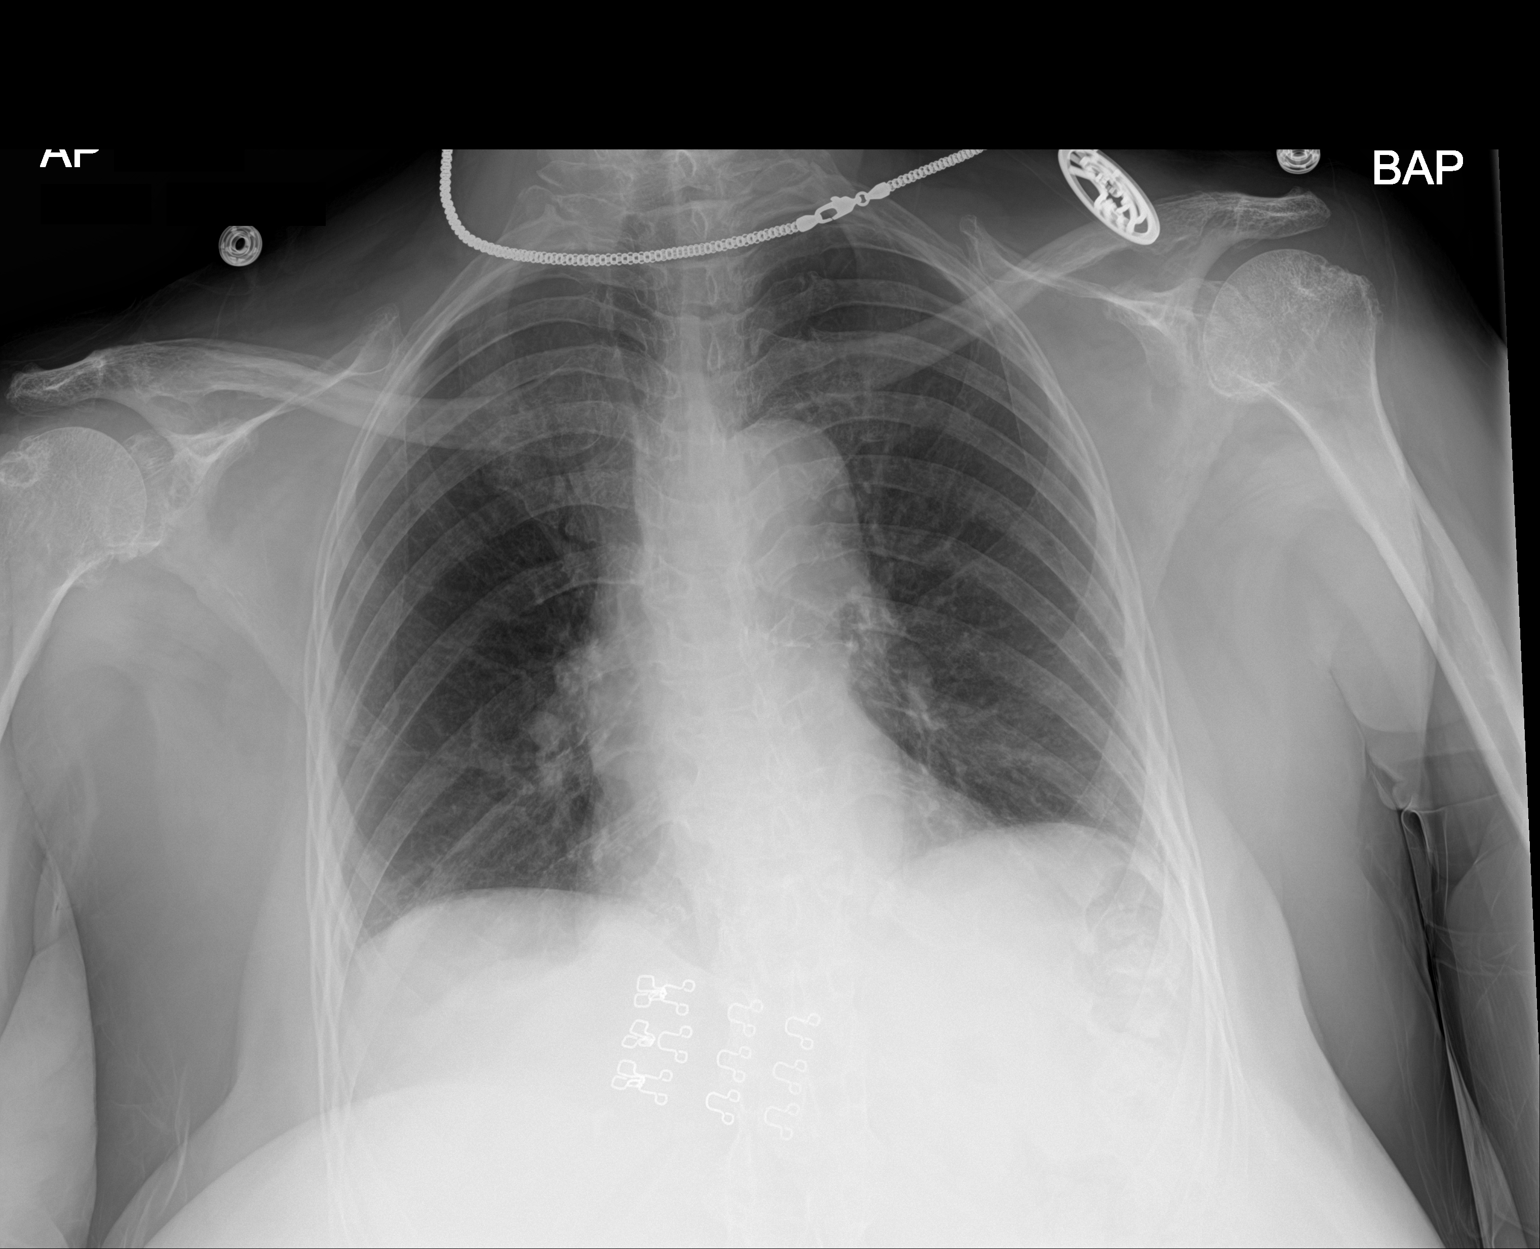

[1 of 1 positions shown; findings below may reference images not displayed]

FINDINGS: Normal sized heart. Tortuous aorta. Clear lungs with normal
vascularity. Mild bilateral shoulder degenerative changes.
IMPRESSION: No acute abnormality.

## 2021-08-14 ENCOUNTER — Ambulatory Visit: Payer: Medicare (Managed Care) | Admitting: Cardiology

## 2021-09-25 ENCOUNTER — Ambulatory Visit: Payer: Medicare (Managed Care) | Admitting: Cardiology

## 2021-10-17 ENCOUNTER — Other Ambulatory Visit: Payer: Self-pay

## 2021-10-17 ENCOUNTER — Ambulatory Visit
Admission: RE | Admit: 2021-10-17 | Discharge: 2021-10-17 | Disposition: A | Discharge status: Home / Self Care | Payer: Medicare (Managed Care) | Source: Ambulatory Visit | Attending: Nurse Practitioner | Admitting: Nurse Practitioner

## 2021-10-17 DIAGNOSIS — Z8611 Personal history of tuberculosis: Secondary | ICD-10-CM

## 2021-11-05 ENCOUNTER — Telehealth: Payer: Self-pay | Admitting: Internal Medicine

## 2021-11-05 NOTE — Telephone Encounter (Signed)
Scheduled appt per 3/3 referral. Pt's daughter is aware of appt date and time. Pt's daughter is aware to arrive 15 mins prior to appt time and to bring and updated insurance card. Pt's daughter is aware of appt location.  Per pt's daughter request I also mailed updated calendar to address.  ?

## 2021-11-16 ENCOUNTER — Encounter: Payer: Self-pay | Admitting: *Deleted

## 2021-11-16 DIAGNOSIS — R918 Other nonspecific abnormal finding of lung field: Secondary | ICD-10-CM

## 2021-11-16 NOTE — Progress Notes (Signed)
Oncology Nurse Navigator Documentation ? ?Oncology Nurse Navigator Flowsheets 11/16/2021  ?Navigator Follow Up Date: 11/20/2021  ?Navigator Follow Up Reason: New Patient Appointment  ?Navigator Location [facility]  ?Navigator Encounter Type Other:  ?Barriers/Navigation Needs Coordination of Care/lab orders placed per protocol.   ?Interventions Coordination of Care  ?Acuity Level 2-Minimal Needs (1-2 Barriers Identified)  ?Time Spent with Patient 15  ?  ?

## 2021-11-20 ENCOUNTER — Inpatient Hospital Stay: Payer: Medicare (Managed Care) | Admitting: Internal Medicine

## 2021-11-20 ENCOUNTER — Inpatient Hospital Stay: Payer: Medicare (Managed Care) | Attending: Internal Medicine

## 2021-11-26 ENCOUNTER — Telehealth: Payer: Self-pay | Admitting: Internal Medicine

## 2021-11-26 NOTE — Telephone Encounter (Signed)
Pt no showed appt. Spoke to pt this morning. R/s appt, she is aware of new appt date and time.  ?

## 2021-12-06 ENCOUNTER — Other Ambulatory Visit: Payer: Self-pay

## 2021-12-06 ENCOUNTER — Encounter: Payer: Self-pay | Admitting: Internal Medicine

## 2021-12-06 ENCOUNTER — Inpatient Hospital Stay: Payer: Medicare (Managed Care) | Attending: Internal Medicine | Admitting: Internal Medicine

## 2021-12-06 ENCOUNTER — Telehealth: Payer: Self-pay

## 2021-12-06 ENCOUNTER — Telehealth: Payer: Self-pay | Admitting: Medical Oncology

## 2021-12-06 ENCOUNTER — Inpatient Hospital Stay: Payer: Medicare (Managed Care)

## 2021-12-06 VITALS — BP 136/77 | HR 95 | Temp 97.1°F | Resp 17 | Wt 128.5 lb

## 2021-12-06 DIAGNOSIS — Z7984 Long term (current) use of oral hypoglycemic drugs: Secondary | ICD-10-CM | POA: Insufficient documentation

## 2021-12-06 DIAGNOSIS — M542 Cervicalgia: Secondary | ICD-10-CM

## 2021-12-06 DIAGNOSIS — I10 Essential (primary) hypertension: Secondary | ICD-10-CM

## 2021-12-06 DIAGNOSIS — E1165 Type 2 diabetes mellitus with hyperglycemia: Secondary | ICD-10-CM | POA: Insufficient documentation

## 2021-12-06 DIAGNOSIS — R911 Solitary pulmonary nodule: Secondary | ICD-10-CM

## 2021-12-06 DIAGNOSIS — Z87891 Personal history of nicotine dependence: Secondary | ICD-10-CM

## 2021-12-06 DIAGNOSIS — J45909 Unspecified asthma, uncomplicated: Secondary | ICD-10-CM

## 2021-12-06 DIAGNOSIS — F0390 Unspecified dementia without behavioral disturbance: Secondary | ICD-10-CM

## 2021-12-06 DIAGNOSIS — M549 Dorsalgia, unspecified: Secondary | ICD-10-CM

## 2021-12-06 DIAGNOSIS — Z79899 Other long term (current) drug therapy: Secondary | ICD-10-CM | POA: Insufficient documentation

## 2021-12-06 DIAGNOSIS — K219 Gastro-esophageal reflux disease without esophagitis: Secondary | ICD-10-CM

## 2021-12-06 DIAGNOSIS — E119 Type 2 diabetes mellitus without complications: Secondary | ICD-10-CM | POA: Diagnosis not present

## 2021-12-06 DIAGNOSIS — R918 Other nonspecific abnormal finding of lung field: Secondary | ICD-10-CM | POA: Insufficient documentation

## 2021-12-06 LAB — CMP (CANCER CENTER ONLY)
ALT: 10 U/L (ref 0–44)
AST: 11 U/L — ABNORMAL LOW (ref 15–41)
Albumin: 3.7 g/dL (ref 3.5–5.0)
Alkaline Phosphatase: 79 U/L (ref 38–126)
Anion gap: 4 — ABNORMAL LOW (ref 5–15)
BUN: 31 mg/dL — ABNORMAL HIGH (ref 8–23)
CO2: 33 mmol/L — ABNORMAL HIGH (ref 22–32)
Calcium: 9.4 mg/dL (ref 8.9–10.3)
Chloride: 96 mmol/L — ABNORMAL LOW (ref 98–111)
Creatinine: 1.16 mg/dL — ABNORMAL HIGH (ref 0.44–1.00)
GFR, Estimated: 46 mL/min — ABNORMAL LOW (ref >60)
Glucose, Bld: 589 mg/dL (ref 70–99)
Potassium: 5.9 mmol/L — ABNORMAL HIGH (ref 3.5–5.1)
Sodium: 133 mmol/L — ABNORMAL LOW (ref 135–145)
Total Bilirubin: 0.4 mg/dL (ref 0.3–1.2)
Total Protein: 7.6 g/dL (ref 6.5–8.1)

## 2021-12-06 LAB — CBC WITH DIFFERENTIAL (CANCER CENTER ONLY)
Abs Immature Granulocytes: 0.01 10*3/uL (ref 0.00–0.07)
Basophils Absolute: 0 10*3/uL (ref 0.0–0.1)
Basophils Relative: 0 %
Eosinophils Absolute: 0.1 10*3/uL (ref 0.0–0.5)
Eosinophils Relative: 2 %
HCT: 40 % (ref 36.0–46.0)
Hemoglobin: 12.8 g/dL (ref 12.0–15.0)
Immature Granulocytes: 0 %
Lymphocytes Relative: 36 %
Lymphs Abs: 2.5 10*3/uL (ref 0.7–4.0)
MCH: 29.2 pg (ref 26.0–34.0)
MCHC: 32 g/dL (ref 30.0–36.0)
MCV: 91.1 fL (ref 80.0–100.0)
Monocytes Absolute: 0.5 10*3/uL (ref 0.1–1.0)
Monocytes Relative: 8 %
Neutro Abs: 3.7 10*3/uL (ref 1.7–7.7)
Neutrophils Relative %: 54 %
Platelet Count: 265 10*3/uL (ref 150–400)
RBC: 4.39 MIL/uL (ref 3.87–5.11)
RDW: 12.5 % (ref 11.5–15.5)
WBC Count: 6.9 10*3/uL (ref 4.0–10.5)
nRBC: 0 % (ref 0.0–0.2)

## 2021-12-06 NOTE — Telephone Encounter (Signed)
Hyperglycemia -Glucose @ 589 mg/dl. ?Called result to [facility] Annice Pih)  ? ? I called [facility] and gave this result to [facility] with Dr Laural Benes. I asked her if they want to take care of the high glucose or have Dr. Arbutus Ped send pt to ED. ?Annice Pih said to please send Julie Colon  back over to [facility] and her provider will take care of pt's  high glucose. ?Daughter, [name] , was instructed to take pt now to [facility] . She voiced understanding. ?

## 2021-12-06 NOTE — Telephone Encounter (Signed)
CRITICAL VALUE STICKER ? ?CRITICAL VALUE: Glucose = 589 ? ?RECEIVER (on-site recipient of call): Yetta Glassman, CMA ? ?DATE & TIME NOTIFIED: 12/06/21 at 12:18pm ? ?MESSENGER (representative from lab): Lelan Pons ? ?MD NOTIFIED: Julien Nordmann ? ?TIME OF NOTIFICATION: 12/06/21 at 12:20pm ? ?RESPONSE: Notification given to Dr. Julien Nordmann who is going to see the pt. He will discuss pts next steps with her and her daughter Veronda Prude. ? ?

## 2021-12-06 NOTE — Progress Notes (Signed)
? ? [facility] ?[phone]   [phone] ? ?CONSULT NOTE ? ?REFERRING PHYSICIAN: Stacy Carpenter, NP  ? ?REASON FOR CONSULTATION:  ?88 years old female with suspicious right lung opacity ? ?HPI ?Julie Colon is a 88 y.o. female with past medical history significant for hypertension, diabetes mellitus, asthma, dementia, GERD and remote history of smoking.  The patient mentions that she has been complaining of back pain after a fall several months ago.  She also has shortness of breath and history of asthma.  She had chest x-ray on June 07, 2021 and that showed right middle lobe airspace opacity.  She then had CT scan of the chest with contrast on June 27, 2021 and that showed right middle lobe airspace process inferiorly could reflect atelectasis or pneumonia.  There was also a right lower lobe nodular lesion probably band of his scarring changes.  Follow-up imaging studies were recommended.  The patient had repeat CT scan of the chest on 10/17/2021 and it showed groundglass and peripheral consolidation in the inferior right middle lobe unchanged from the scan in June 27, 2021.  There was a 0.7 cm subpleural anterior lateral right lower lobe nodule with adjacent to scarring that is also unchanged.  There was no pathologically enlarged mediastinal or axillary lymph nodes.  The patient was referred to me today for evaluation and recommendation about these groundglass opacities. ?When seen today she is feeling fine except for intermittent neck and back pain likely secondary to arthritis.  She denied having any current chest pain, shortness of breath but has occasional cough with no hemoptysis.  She has no nausea, vomiting, diarrhea or constipation.  She has no headache or visual changes. ?Family history significant for mother with diabetes mellitus and hypertension.  Father had asthma. ?The patient is a widow and has 9 children, 7 living.  She was accompanied today by her  daughter Julie Colon.  The patient used to work in retail's.  She has remote history of smoking but quit in 1974.  She drinks alcohol occasionally and no history of drug abuse. ? ?HPI ? ?Past Medical History:  ?Diagnosis Date  ? Asthma   ? Constipation   ? Dementia ([facility])   ? Diabetes mellitus without complication ([facility])   ? GERD (gastroesophageal reflux disease)   ? Hypertension   ? Rectal prolapse   ? ? ?Past Surgical History:  ?Procedure Laterality Date  ? none    ? ? ?Family History  ?Problem Relation Age of Onset  ? Diabetes Mother   ? Hypertension Mother   ? Colon cancer Neg Hx   ? Stomach cancer Neg Hx   ? Pancreatic cancer Neg Hx   ? Esophageal cancer Neg Hx   ? Liver disease Neg Hx   ? ? ?Social History ?Social History  ? ?Tobacco Use  ? Smoking status: Former  ? Smokeless tobacco: Never  ?Vaping Use  ? Vaping Use: Never used  ?Substance Use Topics  ? Alcohol use: No  ? Drug use: No  ? ? ?No Known Allergies ? ?Current Outpatient Medications  ?Medication Sig Dispense Refill  ? Accu-Chek FastClix Lancets MISC UAD 100 each 1  ? acetaminophen (TYLENOL) 325 MG tablet Take 650 mg by mouth every 6 (six) hours as needed.    ? albuterol (VENTOLIN HFA) 108 (90 Base) MCG/ACT inhaler Inhale into the lungs every 6 (six) hours as needed for wheezing or shortness of breath.    ? amLODipine (NORVASC) 10 MG tablet Take   10 mg by mouth daily.    ? bisacodyl (FLEET) 10 MG/30ML ENEM Place 10 mg rectally once. As needed for severe constipation.    ? Blood Glucose Monitoring Suppl (ACCU-CHEK GUIDE ME) w/Device KIT UAD 1 kit 0  ? bumetanide (BUMEX) 1 MG tablet Take 1 tablet (1 mg total) by mouth daily. 30 tablet 0  ? cholecalciferol (VITAMIN D3) 25 MCG (1000 UNIT) tablet Take 1,000 Units by mouth in the morning and at bedtime.    ? donepezil (ARICEPT) 5 MG tablet TAKE 1 TABLET (5 MG TOTAL) BY MOUTH AT BEDTIME. 30 tablet 3  ? famotidine (PEPCID) 20 MG tablet Take 20 mg by mouth 2 (two) times daily.    ? ferrous sulfate 325 (65 FE) MG tablet  TAKE 1 TABLET (325 MG TOTAL) BY MOUTH DAILY WITH BREAKFAST. 100 tablet 1  ? glucose blood (ACCU-CHEK GUIDE) test strip Check BS once a day 100 each 12  ? hydrALAZINE (APRESOLINE) 100 MG tablet Take 100 mg by mouth 2 (two) times daily.    ? hydrochlorothiazide (HYDRODIURIL) 25 MG tablet Take 25 mg by mouth daily.    ? insulin isophane & regular human (HUMULIN 70/30 MIX) (70-30) 100 UNIT/ML KwikPen INJECT 6 UNITS INTO THE SKIN 2 (TWO) TIMES DAILY. 5 mL 6  ? losartan (COZAAR) 100 MG tablet TAKE 1 TABLET (100 MG TOTAL) BY MOUTH DAILY. 30 tablet 5  ? metFORMIN (GLUCOPHAGE) 500 MG tablet TAKE 1 TABLET (500 MG TOTAL) BY MOUTH 2 (TWO) TIMES DAILY WITH A MEAL. 60 tablet 5  ? metoprolol tartrate (LOPRESSOR) 25 MG tablet TAKE 1 TABLET (25 MG TOTAL) BY MOUTH 2 (TWO) TIMES DAILY. 60 tablet 5  ? omeprazole (PRILOSEC) 20 MG capsule TAKE 1 CAPSULE (20 MG TOTAL) BY MOUTH DAILY. 30 capsule 6  ? PEG-KCl-NaCl-NaSulf-Na Asc-C (PLENVU) 140 g SOLR Take 140 g by mouth as directed. 1 each 0  ? sertraline (ZOLOFT) 50 MG tablet TAKE 1/2 TABLET BY MOUTH DAILY 30 tablet 2  ? ?Current Facility-Administered Medications  ?Medication Dose Route Frequency Provider Last Rate Last Admin  ? 0.9 %  sodium chloride infusion  500 mL Intravenous Once Armbruster, Carlota Raspberry, MD      ? ? ?Review of Systems ? ?Constitutional: positive for fatigue ?Eyes: negative ?Ears, nose, mouth, throat, and face: negative ?Respiratory: positive for cough ?Cardiovascular: negative ?Gastrointestinal: negative ?Genitourinary:negative ?Integument/breast: negative ?Hematologic/lymphatic: negative ?Musculoskeletal:positive for arthralgias ?Neurological: negative ?Behavioral/Psych: negative ?Endocrine: negative ?Allergic/Immunologic: negative ? ?Physical Exam ? ?KXF:GHWEX, healthy, no distress, well nourished, and well developed ?SKIN: skin color, texture, turgor are normal, no rashes or significant lesions ?HEAD: Normocephalic, No masses, lesions, tenderness or  abnormalities ?EYES: normal, PERRLA, Conjunctiva are pink and non-injected ?EARS: External ears normal, Canals clear ?OROPHARYNX:no exudate, no erythema, and lips, buccal mucosa, and tongue normal  ?NECK: supple, no adenopathy, no JVD ?LYMPH:  no palpable lymphadenopathy, no hepatosplenomegaly ?BREAST:not examined ?LUNGS: clear to auscultation , and palpation ?HEART: regular rate & rhythm, no murmurs, and no gallops ?ABDOMEN:abdomen soft, non-tender, normal bowel sounds, and no masses or organomegaly ?BACK: Back symmetric, no curvature., No CVA tenderness ?EXTREMITIES:no joint deformities, effusion, or inflammation, no edema  ?NEURO: alert & oriented x 3 with fluent speech, no focal motor/sensory deficits ? ?PERFORMANCE STATUS: ECOG 1 ? ?LABORATORY DATA: ?Lab Results  ?Component Value Date  ? WBC 6.9 12/06/2021  ? HGB 12.8 12/06/2021  ? HCT 40.0 12/06/2021  ? MCV 91.1 12/06/2021  ? PLT 265 12/06/2021  ? ? ?  Chemistry   ?   ?  Component Value Date/Time  ? NA 139 09/29/2020 1140  ? K 5.0 09/29/2020 1140  ? CL 98 09/29/2020 1140  ? CO2 28 09/29/2020 1140  ? BUN 12 09/29/2020 1140  ? CREATININE 0.73 09/29/2020 1140  ?    ?Component Value Date/Time  ? CALCIUM 9.6 09/29/2020 1140  ? ALKPHOS 106 09/29/2020 1140  ? AST 14 09/29/2020 1140  ? ALT 9 09/29/2020 1140  ? BILITOT 0.3 09/29/2020 1140  ?  ? ? ? ?RADIOGRAPHIC STUDIES: ?No results found. ? ?ASSESSMENT: This is a very pleasant 88 years old female with groundglass opacity and consolidation in the right middle lobe as well as a subpleural nodule in the right lower lobe likely inflammatory in origin/scarring but low-grade adenocarcinoma could not be completely excluded. ?Her disease has been stable since the previous scan in October 2022. ? ?PLAN: I had a lengthy discussion with the patient and her daughter today about her current condition and treatment options. ?I personally and independently reviewed the scan images and discussed the result and showed the images to the  patient and her daughter. ?The finding on the scan are nonspecific and could be inflammatory or scarring in nature but again slowly growing adenocarcinoma could not be excluded. ?I recommended for the patient to continue

## 2022-04-09 ENCOUNTER — Other Ambulatory Visit: Payer: Self-pay

## 2022-04-09 ENCOUNTER — Other Ambulatory Visit: Payer: Medicare (Managed Care)

## 2022-04-09 ENCOUNTER — Ambulatory Visit (HOSPITAL_COMMUNITY)
Admission: RE | Admit: 2022-04-09 | Discharge: 2022-04-09 | Disposition: A | Discharge status: Home / Self Care | Payer: Medicare (Managed Care) | Source: Ambulatory Visit | Attending: Internal Medicine | Admitting: Internal Medicine

## 2022-04-09 ENCOUNTER — Inpatient Hospital Stay: Payer: Medicare (Managed Care) | Attending: Internal Medicine

## 2022-04-09 DIAGNOSIS — R911 Solitary pulmonary nodule: Secondary | ICD-10-CM | POA: Insufficient documentation

## 2022-04-09 LAB — CMP (CANCER CENTER ONLY)
ALT: 10 U/L (ref 0–44)
AST: 15 U/L (ref 15–41)
Albumin: 3.8 g/dL (ref 3.5–5.0)
Alkaline Phosphatase: 64 U/L (ref 38–126)
Anion gap: 4 — ABNORMAL LOW (ref 5–15)
BUN: 20 mg/dL (ref 8–23)
CO2: 33 mmol/L — ABNORMAL HIGH (ref 22–32)
Calcium: 9.1 mg/dL (ref 8.9–10.3)
Chloride: 99 mmol/L (ref 98–111)
Creatinine: 1.12 mg/dL — ABNORMAL HIGH (ref 0.44–1.00)
GFR, Estimated: 48 mL/min — ABNORMAL LOW (ref >60)
Glucose, Bld: 196 mg/dL — ABNORMAL HIGH (ref 70–99)
Potassium: 4.4 mmol/L (ref 3.5–5.1)
Sodium: 136 mmol/L (ref 135–145)
Total Bilirubin: 0.3 mg/dL (ref 0.3–1.2)
Total Protein: 7.7 g/dL (ref 6.5–8.1)

## 2022-04-09 LAB — CBC WITH DIFFERENTIAL (CANCER CENTER ONLY)
Abs Immature Granulocytes: 0.03 10*3/uL (ref 0.00–0.07)
Basophils Absolute: 0 10*3/uL (ref 0.0–0.1)
Basophils Relative: 0 %
Eosinophils Absolute: 0.2 10*3/uL (ref 0.0–0.5)
Eosinophils Relative: 2 %
HCT: 36.6 % (ref 36.0–46.0)
Hemoglobin: 12.2 g/dL (ref 12.0–15.0)
Immature Granulocytes: 0 %
Lymphocytes Relative: 36 %
Lymphs Abs: 3.2 10*3/uL (ref 0.7–4.0)
MCH: 30.9 pg (ref 26.0–34.0)
MCHC: 33.3 g/dL (ref 30.0–36.0)
MCV: 92.7 fL (ref 80.0–100.0)
Monocytes Absolute: 0.8 10*3/uL (ref 0.1–1.0)
Monocytes Relative: 9 %
Neutro Abs: 4.6 10*3/uL (ref 1.7–7.7)
Neutrophils Relative %: 53 %
Platelet Count: 325 10*3/uL (ref 150–400)
RBC: 3.95 MIL/uL (ref 3.87–5.11)
RDW: 12.3 % (ref 11.5–15.5)
WBC Count: 8.8 10*3/uL (ref 4.0–10.5)
nRBC: 0 % (ref 0.0–0.2)

## 2022-04-09 MED ORDER — SODIUM CHLORIDE (PF) 0.9 % IJ SOLN
INTRAMUSCULAR | Status: AC
  Filled 2022-04-09: qty 50

## 2022-04-09 MED ORDER — IOHEXOL 300 MG/ML  SOLN
75.0000 mL | Freq: Once | INTRAMUSCULAR | Status: AC | PRN
  Administered 2022-04-09: 75 mL via INTRAVENOUS

## 2022-04-11 ENCOUNTER — Inpatient Hospital Stay: Payer: Medicare (Managed Care) | Admitting: Internal Medicine

## 2022-06-12 ENCOUNTER — Other Ambulatory Visit: Payer: Self-pay | Admitting: Family Medicine

## 2022-06-12 ENCOUNTER — Ambulatory Visit
Admission: RE | Admit: 2022-06-12 | Discharge: 2022-06-12 | Disposition: A | Discharge status: Home / Self Care | Payer: Medicare (Managed Care) | Source: Ambulatory Visit | Attending: Family Medicine | Admitting: Family Medicine

## 2022-06-12 DIAGNOSIS — J441 Chronic obstructive pulmonary disease with (acute) exacerbation: Secondary | ICD-10-CM

## 2022-10-15 ENCOUNTER — Telehealth: Payer: Self-pay | Admitting: Internal Medicine

## 2022-10-15 NOTE — Telephone Encounter (Signed)
Per 3/13 IB reached out to patient to schedule lab/ est visit and one year follow up left voicemail for patient to call back

## 2022-12-30 IMAGING — DX DG CHEST 2V
2 series · 2 of 2 positions shown · non-contrast
Comparison: X-ray chest 03/02/2020; x-ray chest 04/20/2020.

CLINICAL DATA: Dyspnea on exertion.  History of asthma.

EXAM:
CHEST - 2 VIEW

[dg chest 2 view (1 of 2)]
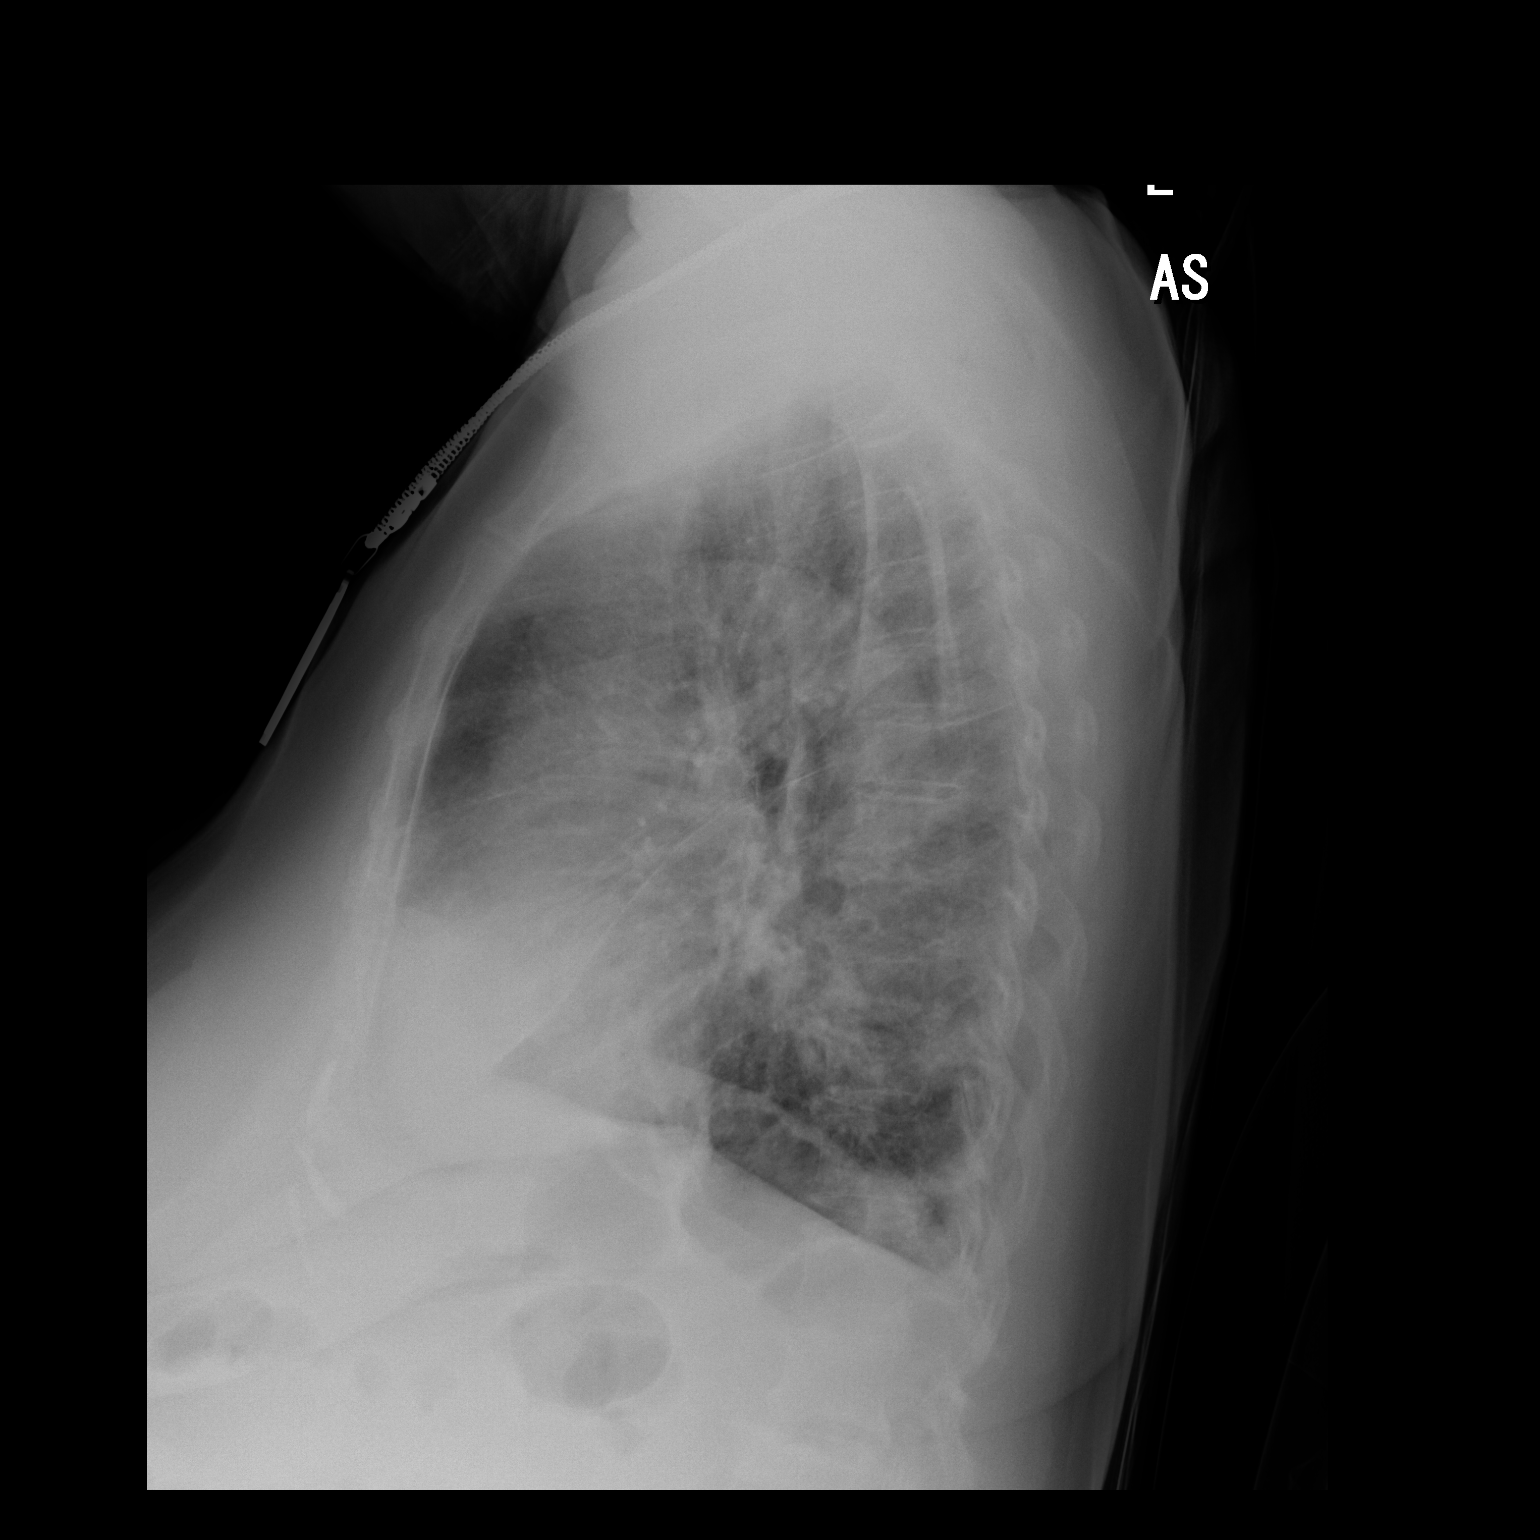

[dg chest 2 view (2 of 2)]
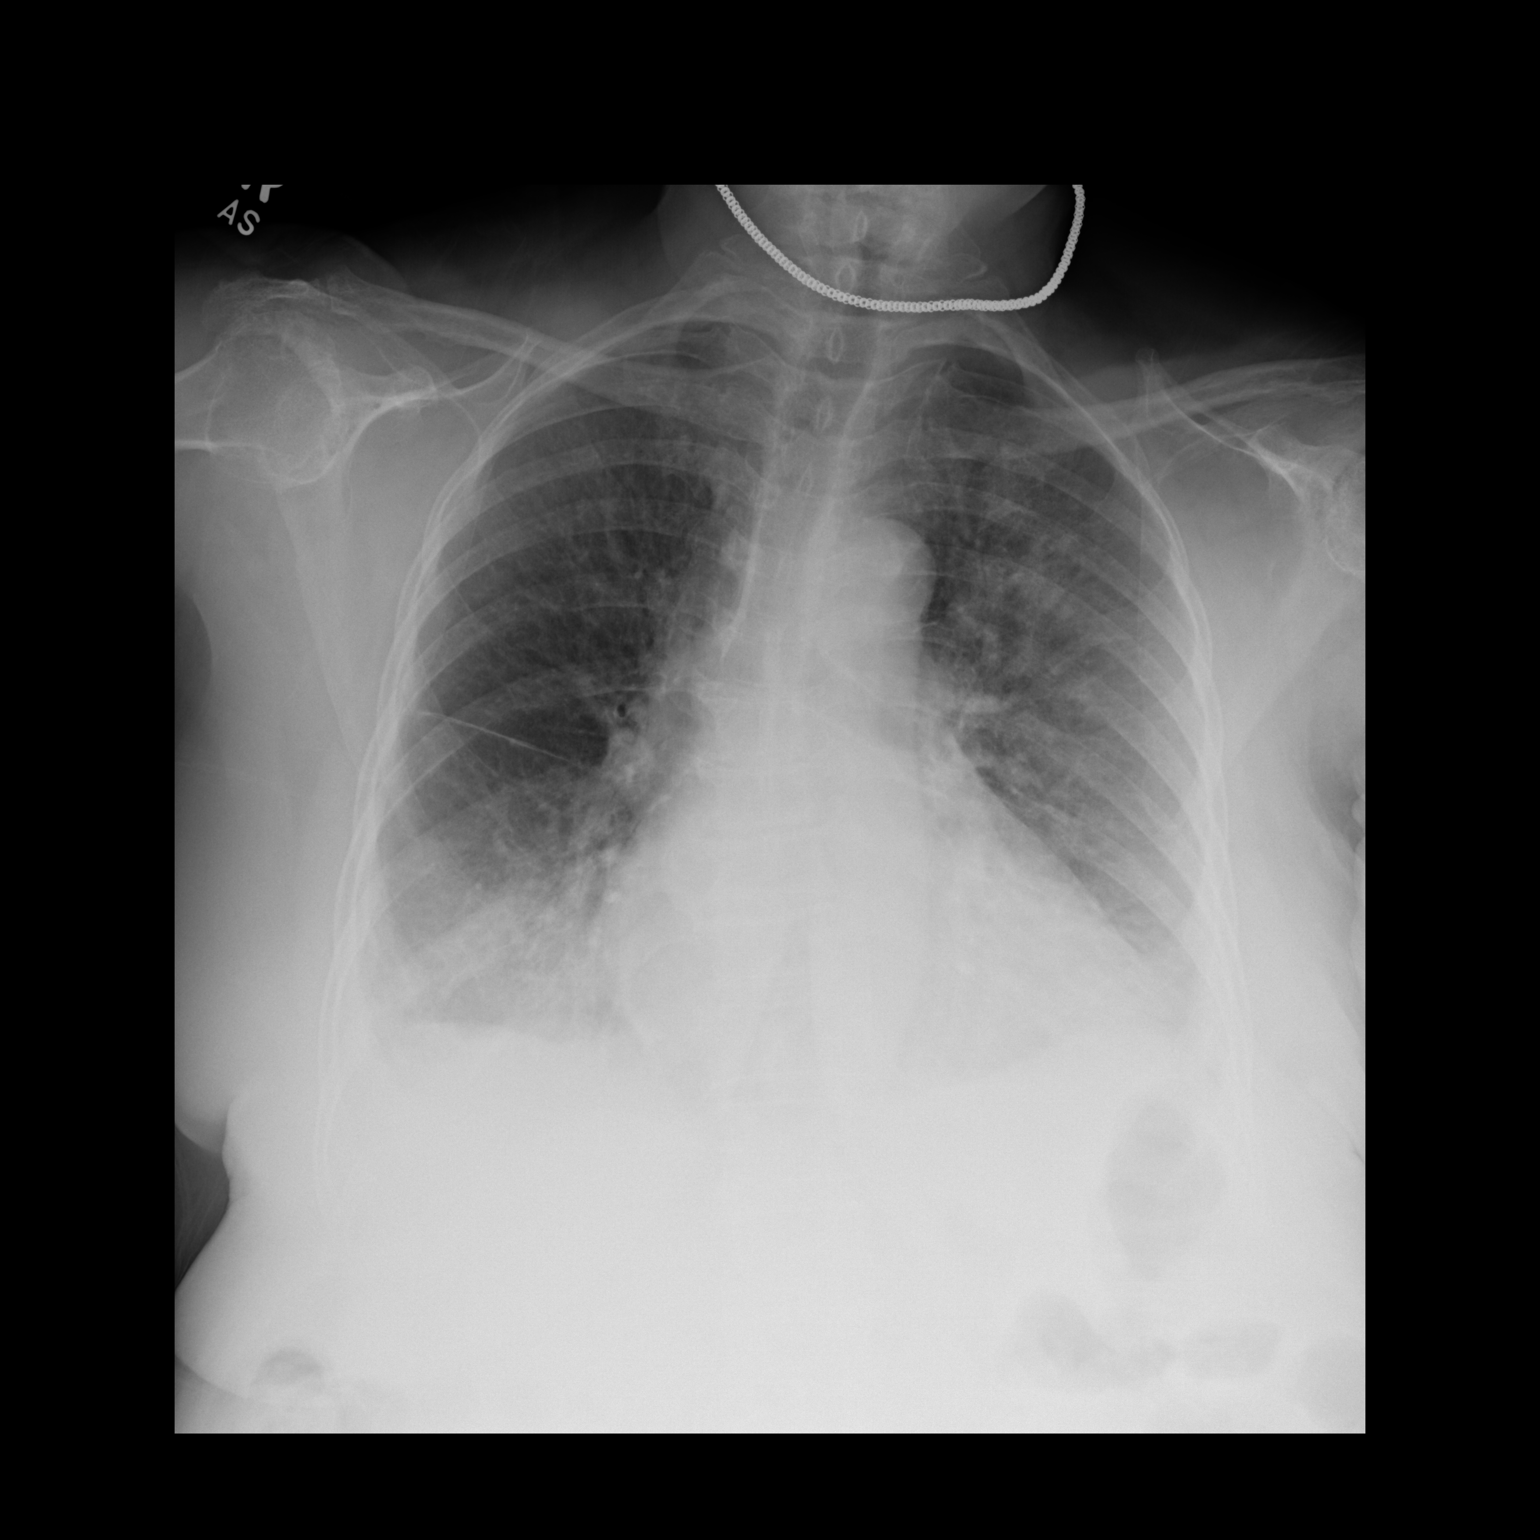

[2 of 2 positions shown; findings below may reference images not displayed]

FINDINGS: Continued right middle lobe airspace opacity, present since
03/02/2020. Mild cardiomegaly. Upper zone pulmonary vascular
prominence suggesting pulmonary venous hypertension. Equivocal
blunting of the lateral costophrenic angles but not the posterior
costophrenic angles.
IMPRESSION: 1. Continued right middle lobe airspace opacity for greater than 3
months. This raises concern for the possibility of underlying
malignancy or obstructing lesion. Chest CT with contrast is
recommended for further workup.
2. Mild cardiomegaly with pulmonary venous hypertension.

## 2023-01-19 IMAGING — CT CT CHEST W/ CM
1 series · 15 of 33 positions shown, 19 images · IV contrast (APPLIED)
Comparison: Chest x-ray 06/07/2021

CLINICAL DATA: Shortness of breath for 1 month.

EXAM:
CT CHEST WITH CONTRAST
TECHNIQUE: Multidetector CT imaging of the chest was performed during
intravenous contrast administration.
CONTRAST:  75mL P28OFS-SJJ IOPAMIDOL (P28OFS-SJJ) INJECTION 61%

[Series 2: chest w/cm · axial · 0.59mm/px · z∈[-255,-33]mm · 15 of 131 slices shown, 19 images]
[im 10/131  mediastinal]
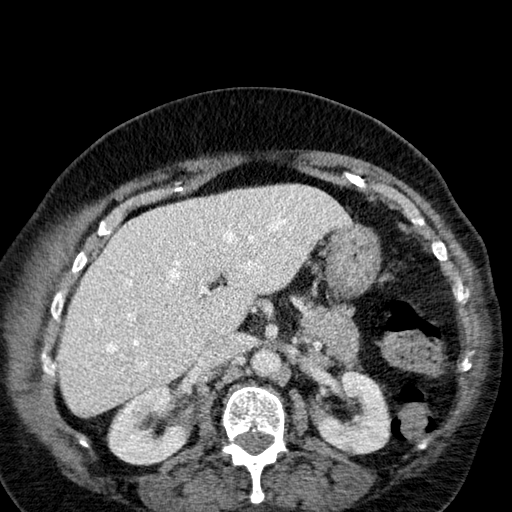
[im 10/131  lung]
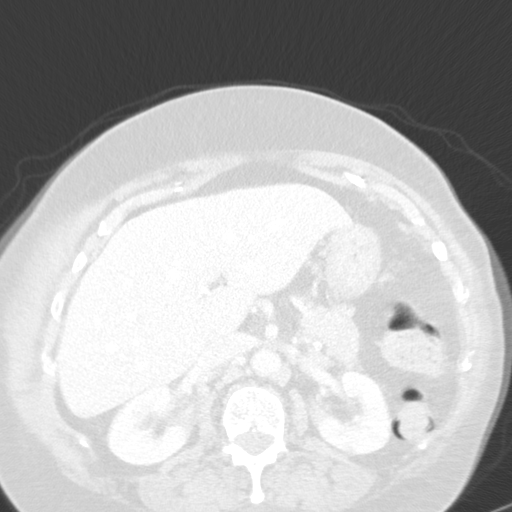
[im 20/131  lung]
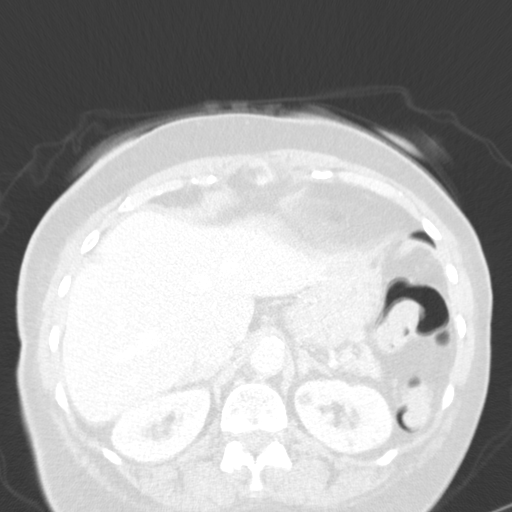
[im 27/131  lung]
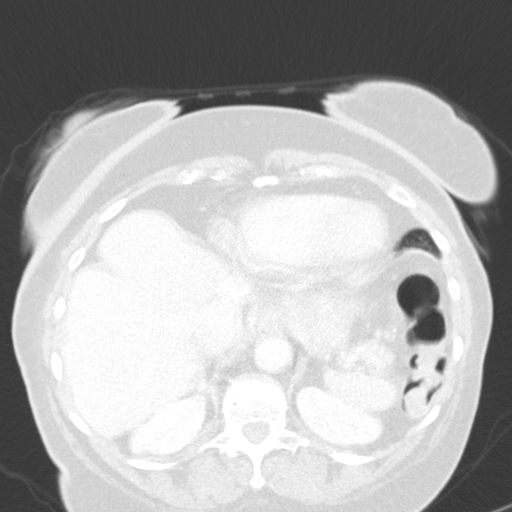
[im 34/131  lung]
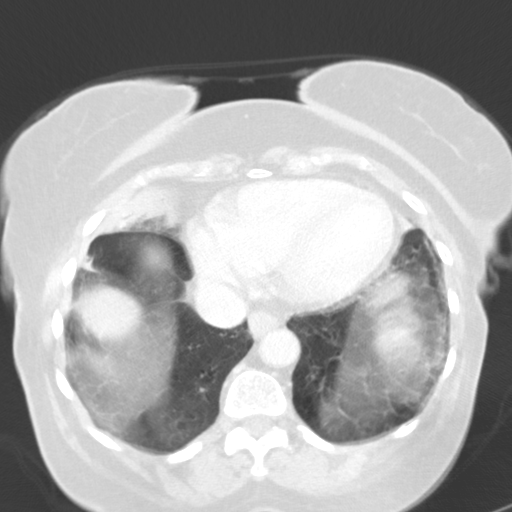
[im 44/131  mediastinal]
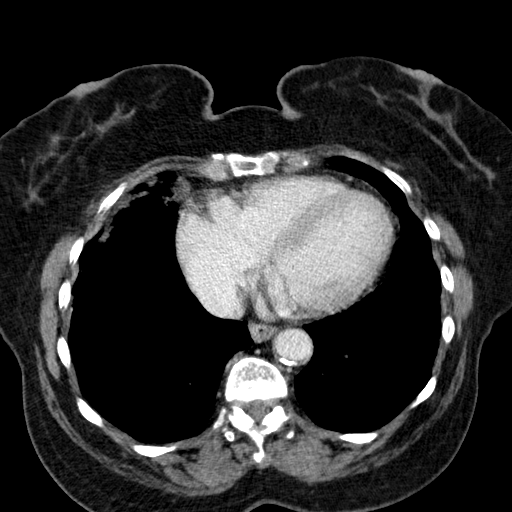
[im 44/131  lung]
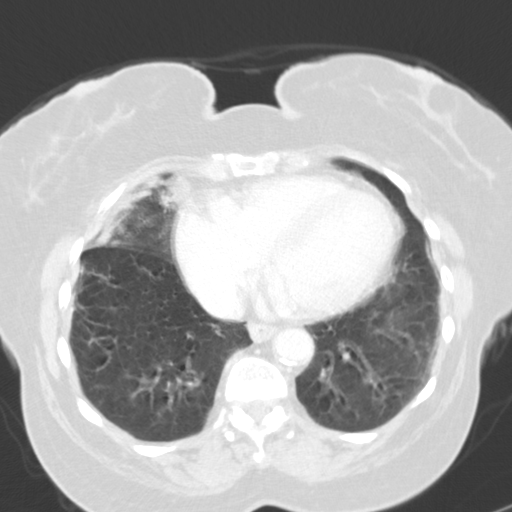
[im 53/131  lung]
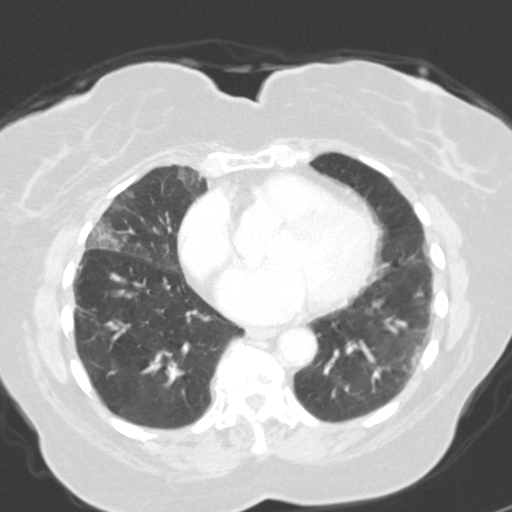
[im 61/131  lung]
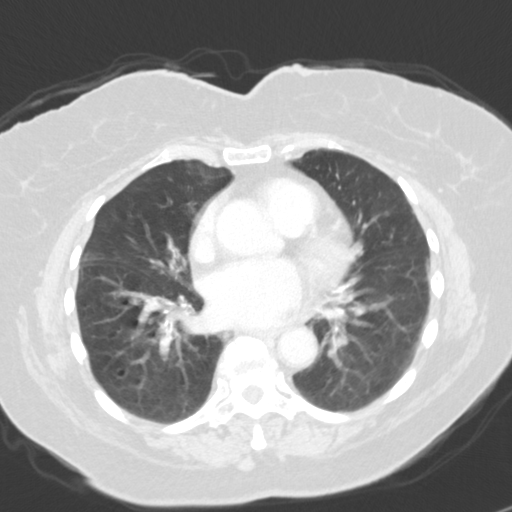
[im 68/131  lung]
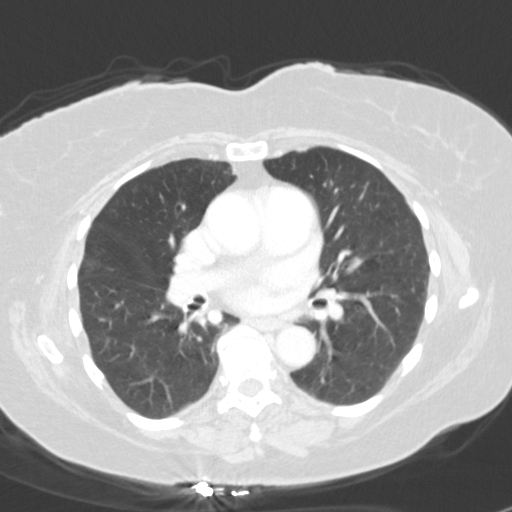
[im 73/131  mediastinal]
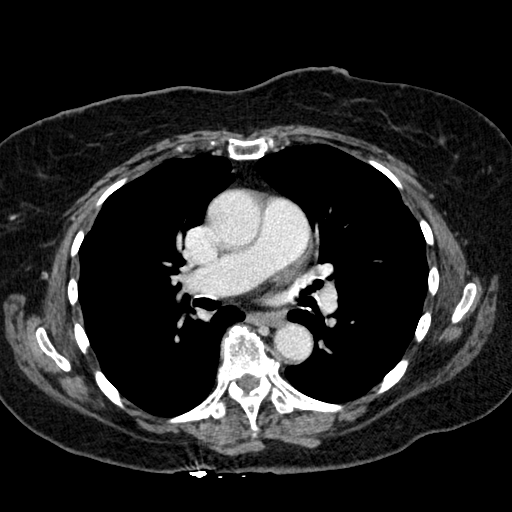
[im 73/131  lung]
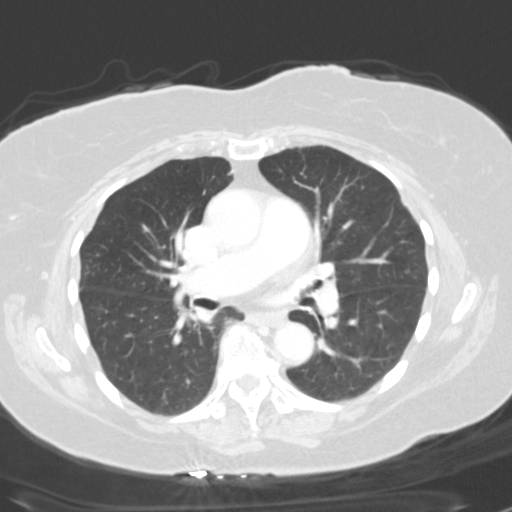
[im 79/131  lung]
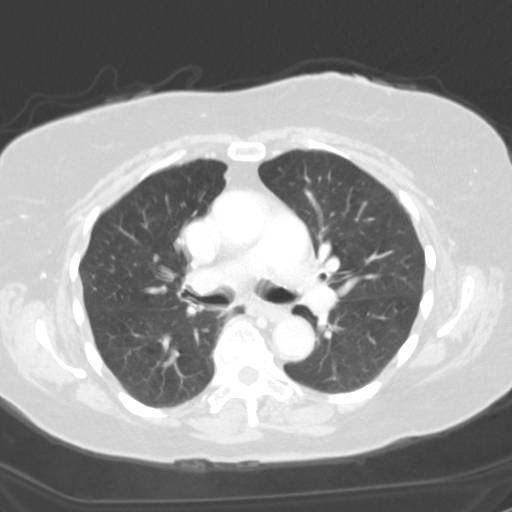
[im 87/131  lung]
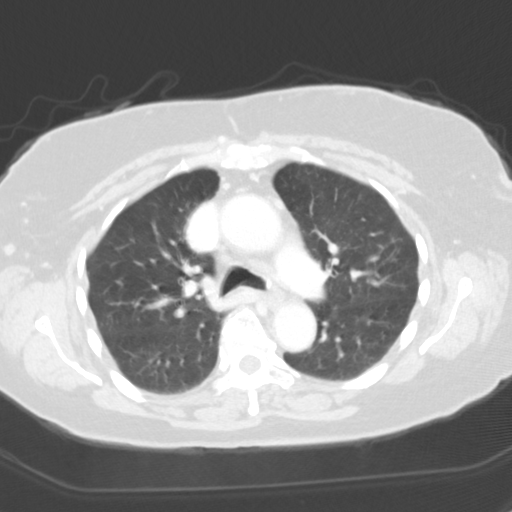
[im 97/131  lung]
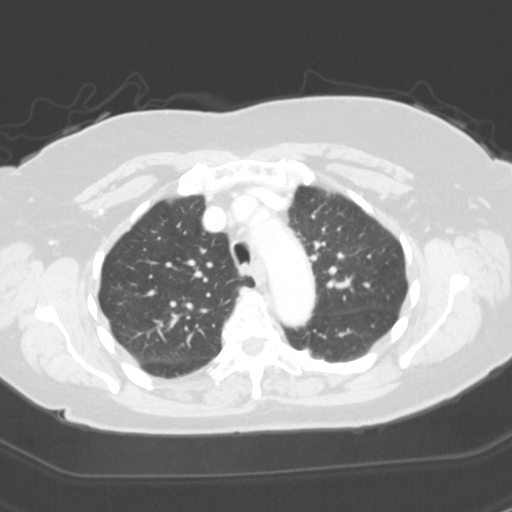
[im 105/131  mediastinal]
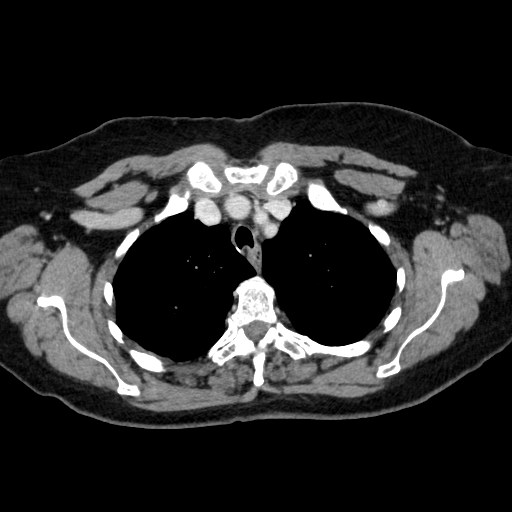
[im 105/131  lung]
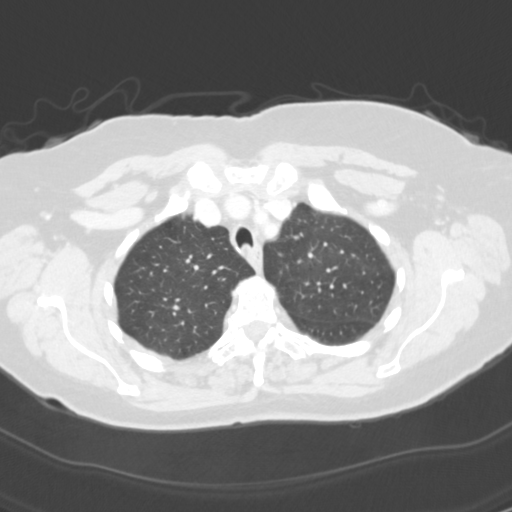
[im 111/131  lung]
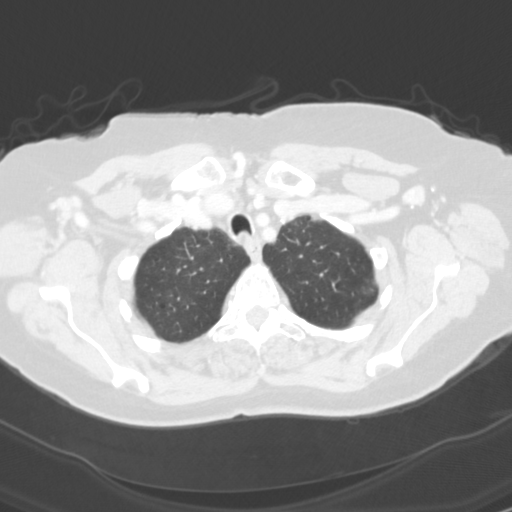
[im 121/131  lung]
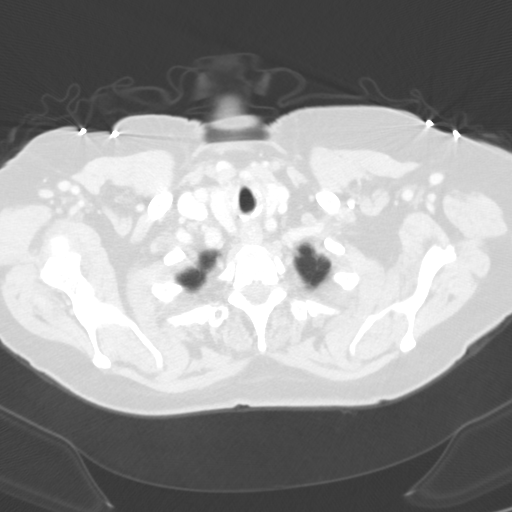

[15 of 33 positions shown; findings below may reference images not displayed]

FINDINGS: Cardiovascular: The heart is normal in size. No pericardial
effusion. The aorta is normal in caliber. No dissection. Remarkably
little atherosclerotic calcifications for age. The branch vessels
are patent. Minimal scattered coronary artery calcifications. The
pulmonary arteries appear normal.

Mediastinum/Nodes: No mediastinal or hilar mass or lymphadenopathy.
Small scattered lymph nodes are noted. The esophagus is
unremarkable. The thyroid gland is unremarkable.

Lungs/Pleura: Minimal emphysematous changes. Right middle lobe
airspace process inferiorly could reflect atelectasis or pneumonia.

Rounded nodular lesion in the right lower lobe anteriorly on image
number 96/3 is somewhat elongated/linear on the sagittal images and
looks like a band of scarring change. No pleural effusions or
pleural lesions.

Upper Abdomen: 15 mm left adrenal gland nodule is indeterminate but
likely a benign adenoma. No hepatic lesions. Small renal cysts.

Musculoskeletal: Age related osteoporosis and degenerative changes
but no bone lesions or fractures.
IMPRESSION: 1. Right middle lobe airspace process inferiorly could reflect
atelectasis or pneumonia.
2. Right lower lobe nodular lesion, probable band of scarring
change.
3. Recommend post treatment follow-up noncontrast chest CT in 3-4
months.
4. No mediastinal or hilar mass or adenopathy.
5. Indeterminate 15 mm left adrenal gland nodule, likely benign
adenoma. This could be reassessed and Hounsfield units could be
actually measured on the follow-up noncontrast chest CT.
6. Age related osteoporosis and degenerative changes but no acute
bony findings.

## 2023-02-12 ENCOUNTER — Ambulatory Visit: Payer: Self-pay | Admitting: *Deleted

## 2023-02-12 NOTE — Telephone Encounter (Signed)
  Chief Complaint: Back pain Symptoms: Lower back Frequency:  Pertinent Negatives: Patient denies  Disposition: [] ED /[] Urgent Care (no appt availability in office) / [] Appointment(In office/virtual)/ []  [facility]/ [] Home Care/ [] Refused Recommended Disposition /[] [facility]/ []  Follow-up with PCP Additional Notes: Triage incomplete. Pt in [place], returns in 5 days. Advised to have pt call for assessment. Advised ED if worsening. Reason for Disposition  [1] SEVERE back pain (e.g., excruciating, unable to do any normal activities) AND [2] not improved 2 hours after pain medicine  Answer Assessment - Initial Assessment Questions 1. ONSET: "When did the pain begin?"      1 month ago 2. LOCATION: "Where does it hurt?" (upper, mid or lower back)     Lower 3. SEVERITY: "How bad is the pain?"  (e.g., Scale 1-10; mild, moderate, or severe)   - MILD (1-3): Doesn't interfere with normal activities.    - MODERATE (4-7): Interferes with normal activities or awakens from sleep.    - SEVERE (8-10): Excruciating pain, unable to do any normal activities.      8/10 4. PATTERN: "Is the pain constant?" (e.g., yes, no; constant, intermittent)      Constant 5. RADIATION: "Does the pain shoot into your legs or somewhere else?"     No 6. CAUSE:  "What do you think is causing the back pain?"      Unsure 7. BACK OVERUSE:  "Any recent lifting of heavy objects, strenuous work or exercise?"      8. MEDICINES: "What have you taken so far for the pain?" (e.g., nothing, acetaminophen, NSAIDS)     none 9. NEUROLOGIC SYMPTOMS: "Do you have any weakness, numbness, or problems with bowel/bladder control?"     no 10. OTHER SYMPTOMS: "Do you have any other symptoms?" (e.g., fever, abdomen pain, burning with urination, blood in urine)        None  Protocols used: Back Pain-A-AH

## 2023-02-24 ENCOUNTER — Emergency Department (HOSPITAL_COMMUNITY): Payer: Medicare Other

## 2023-02-24 ENCOUNTER — Encounter (HOSPITAL_COMMUNITY): Payer: Self-pay | Admitting: *Deleted

## 2023-02-24 ENCOUNTER — Emergency Department (HOSPITAL_COMMUNITY)
Admission: EM | Admit: 2023-02-24 | Discharge: 2023-02-24 | Disposition: A | Discharge status: Home / Self Care | Payer: Medicare Other | Attending: Emergency Medicine | Admitting: Emergency Medicine

## 2023-02-24 ENCOUNTER — Other Ambulatory Visit: Payer: Self-pay

## 2023-02-24 DIAGNOSIS — F0390 Unspecified dementia without behavioral disturbance: Secondary | ICD-10-CM | POA: Diagnosis not present

## 2023-02-24 DIAGNOSIS — M5136 Other intervertebral disc degeneration, lumbar region: Secondary | ICD-10-CM | POA: Insufficient documentation

## 2023-02-24 DIAGNOSIS — Z7984 Long term (current) use of oral hypoglycemic drugs: Secondary | ICD-10-CM | POA: Insufficient documentation

## 2023-02-24 DIAGNOSIS — M48061 Spinal stenosis, lumbar region without neurogenic claudication: Secondary | ICD-10-CM | POA: Diagnosis not present

## 2023-02-24 DIAGNOSIS — Z794 Long term (current) use of insulin: Secondary | ICD-10-CM | POA: Insufficient documentation

## 2023-02-24 DIAGNOSIS — R000 Tachycardia, unspecified: Secondary | ICD-10-CM | POA: Diagnosis not present

## 2023-02-24 DIAGNOSIS — E119 Type 2 diabetes mellitus without complications: Secondary | ICD-10-CM | POA: Insufficient documentation

## 2023-02-24 DIAGNOSIS — M545 Low back pain, unspecified: Secondary | ICD-10-CM | POA: Diagnosis present

## 2023-02-24 MED ORDER — ACETAMINOPHEN 325 MG PO TABS
650.0000 mg | ORAL_TABLET | Freq: Once | ORAL | Status: AC
  Administered 2023-02-24: 650 mg via ORAL
  Filled 2023-02-24: qty 2

## 2023-02-24 NOTE — ED Provider Notes (Signed)
[place] EMERGENCY DEPARTMENT AT [facility] Provider Note   CSN: [id number] Arrival date & time: 02/24/23  1205     History  Chief Complaint  Patient presents with   Back Pain    Julie Colon is a 89 y.o. female history of type 2 diabetes, AKI, hyponatremia, hyperkalemia, history of prior TB, GERD, dementia presented with lower back pain has been present for few months.  Patient states that when she was in [place] she would get back injections for pain management for this.  Patient states she has tried Tylenol since going back to [place] which has not been helping.  Patient does not see orthopedics or spine.  Patient is been able to walk however walking is beginning progressively harder.  Patient denies any saddle anesthesia, urinary/bowel incontinence, fevers, falls, trauma.  Patient states that she does not have any history of back issues however.  Patient denied chest pain, shortness of breath, urinary symptoms, dysuria, change in sensation/motor skills  Home Medications Prior to Admission medications   Medication Sig Start Date End Date Taking? Authorizing Provider  Accu-Chek FastClix Lancets MISC UAD 04/20/20   Marcine Matar, MD  acetaminophen (TYLENOL) 325 MG tablet Take 650 mg by mouth every 6 (six) hours as needed.    [provider]  albuterol (VENTOLIN HFA) 108 (90 Base) MCG/ACT inhaler Inhale into the lungs every 6 (six) hours as needed for wheezing or shortness of breath.    [provider]  amLODipine (NORVASC) 10 MG tablet Take 10 mg by mouth daily.    [provider]  bisacodyl (FLEET) 10 MG/30ML ENEM Place 10 mg rectally once. As needed for severe constipation.    [provider]  Blood Glucose Monitoring Suppl (ACCU-CHEK GUIDE ME) w/Device KIT UAD 04/20/20   Marcine Matar, MD  bumetanide (BUMEX) 1 MG tablet Take 1 tablet (1 mg total) by mouth daily. 03/04/20 04/03/20  Jerald Kief, MD  cholecalciferol  (VITAMIN D3) 25 MCG (1000 UNIT) tablet Take 1,000 Units by mouth in the morning and at bedtime.    [provider]  donepezil (ARICEPT) 5 MG tablet TAKE 1 TABLET (5 MG TOTAL) BY MOUTH AT BEDTIME. 09/29/20 09/29/21  Marcine Matar, MD  famotidine (PEPCID) 20 MG tablet Take 20 mg by mouth 2 (two) times daily.    [provider]  ferrous sulfate 325 (65 FE) MG tablet TAKE 1 TABLET (325 MG TOTAL) BY MOUTH DAILY WITH BREAKFAST. 09/29/20 09/29/21  Marcine Matar, MD  glucose blood (ACCU-CHEK GUIDE) test strip Check BS once a day 04/20/20   Marcine Matar, MD  hydrALAZINE (APRESOLINE) 100 MG tablet Take 100 mg by mouth 2 (two) times daily.    [provider]  hydrochlorothiazide (HYDRODIURIL) 25 MG tablet Take 25 mg by mouth daily.    [provider]  insulin isophane & regular human (HUMULIN 70/30 MIX) (70-30) 100 UNIT/ML KwikPen INJECT 6 UNITS INTO THE SKIN 2 (TWO) TIMES DAILY. 09/29/20 09/29/21  Marcine Matar, MD  losartan (COZAAR) 100 MG tablet TAKE 1 TABLET (100 MG TOTAL) BY MOUTH DAILY. 09/29/20 09/29/21  Marcine Matar, MD  metFORMIN (GLUCOPHAGE) 500 MG tablet TAKE 1 TABLET (500 MG TOTAL) BY MOUTH 2 (TWO) TIMES DAILY WITH A MEAL. 09/29/20 09/29/21  Marcine Matar, MD  metoprolol tartrate (LOPRESSOR) 25 MG tablet TAKE 1 TABLET (25 MG TOTAL) BY MOUTH 2 (TWO) TIMES DAILY. 09/29/20 09/29/21  Marcine Matar, MD  omeprazole (PRILOSEC) 20  MG capsule TAKE 1 CAPSULE (20 MG TOTAL) BY MOUTH DAILY. 09/29/20 09/29/21  Marcine Matar, MD  PEG-KCl-NaCl-NaSulf-Na Asc-C (PLENVU) 140 g SOLR Take 140 g by mouth as directed. 01/03/21   Meredith Pel, NP  sertraline (ZOLOFT) 50 MG tablet TAKE 1/2 TABLET BY MOUTH DAILY 10/01/20 10/01/21  Marcine Matar, MD      Allergies    Patient has no known allergies.    Review of Systems   Review of Systems  Musculoskeletal:  Positive for back pain.    Physical Exam Updated Vital Signs BP (!) 155/87 (BP Location:  Left Arm)   Pulse (!) 108   Temp 98.3 F (36.8 C) (Oral)   Resp 18   Wt 72.6 kg   SpO2 98%   BMI 32.32 kg/m  Physical Exam Vitals reviewed.  Constitutional:      General: She is not in acute distress. HENT:     Head: Normocephalic and atraumatic.  Cardiovascular:     Rate and Rhythm: Regular rhythm. Tachycardia present.     Pulses: Normal pulses.     Heart sounds: Normal heart sounds.     Comments: 2+ bilateral radial/dorsalis pedis pulses with regular rate Pulmonary:     Effort: Pulmonary effort is normal. No respiratory distress.     Breath sounds: Normal breath sounds.  Abdominal:     Palpations: Abdomen is soft.     Tenderness: There is no abdominal tenderness. There is no guarding or rebound.  Musculoskeletal:        General: Normal range of motion.     Cervical back: Normal range of motion and neck supple.     Comments: 5 out of 5 bilateral grip/leg extension strength Tender in palpation bilateral lumbar muscles however no midline tenderness or abnormalities palpated  Skin:    General: Skin is warm and dry.     Capillary Refill: Capillary refill takes less than 2 seconds.  Neurological:     General: No focal deficit present.     Mental Status: She is alert and oriented to person, place, and time.     Comments: Sensation intact in all 4 limbs Able to bear weight and walk without abnormalities  Psychiatric:        Mood and Affect: Mood normal.     ED Results / Procedures / Treatments   Labs (all labs ordered are listed, but only abnormal results are displayed) Labs Reviewed - No data to display  EKG None  Radiology CT Lumbar Spine Wo Contrast  Result Date: 02/24/2023 CLINICAL DATA:  Low back pain with progressive weakness EXAM: CT LUMBAR SPINE WITHOUT CONTRAST TECHNIQUE: Multidetector CT imaging of the lumbar spine was performed without intravenous contrast administration. Multiplanar CT image reconstructions were also generated. RADIATION DOSE REDUCTION:  This exam was performed according to the departmental dose-optimization program which includes automated exposure control, adjustment of the mA and/or kV according to patient size and/or use of iterative reconstruction technique. COMPARISON:  None Available. FINDINGS: Segmentation: 5 lumbar type vertebrae. Alignment: Mild retrolisthesis of L2 on L3. Vertebrae: Diffuse osseous demineralization. No acute fracture. Endplate irregularities at the L2-3, L3-4, L4-5, and L5-S1 levels are favored to be degenerative. No suspicious lytic or sclerotic bone lesion is identified. Paraspinal and other soft tissues: Aortic atherosclerosis. No paravertebral inflammatory changes or fluid collections. Disc levels: Disc height loss, most pronounced at L2-3 and L5-S1. Mild multilevel facet arthropathy. Suspect mild canal stenosis at L2-3 and L4-5. Bilateral foraminal stenosis is most pronounced at  L2-3. IMPRESSION: 1. No acute fracture or traumatic malalignment of the lumbar spine. 2. Multilevel degenerative disc disease and facet arthropathy. Suspect mild canal stenosis at L2-3 and L4-5. Bilateral foraminal stenosis is most pronounced at L2-3. 3. Aortic atherosclerosis (ICD10-I70.0). Electronically Signed   By: Duanne Guess D.O.   On: 02/24/2023 14:57    Procedures Procedures    Medications Ordered in ED Medications  acetaminophen (TYLENOL) tablet 650 mg (650 mg Oral Given 02/24/23 1313)    ED Course/ Medical Decision Making/ A&P                             Medical Decision Making Amount and/or Complexity of Data Reviewed Radiology: ordered.  Risk OTC drugs.   Kanylah Dymek 89 y.o. presented today for back pain. Working DDx that I considered at this time includes, but not limited to, MSK, underlying fracture, epidural hematoma, cauda equina syndrome, spinal stenosis, spinal malignancy, dural abscess, discitis, spinal infection.  R/o DDx: MSK, underlying fracture, epidural hematoma, cauda equina syndrome,  spinal malignancy, dural abscess, discitis, spinal infection : less likely due to history of present illness and physical exam findings  Review of prior external notes: 10/19/2022 ED  Unique Tests and My Interpretation:  CT lumbar spine without contrast: Mild spinal stenosis noted, DDD  Discussion with Independent Historian:  Daughter  Discussion of Management of Tests: None  Risk: Low: based on diagnostic testing/clinical impression and treatment plan  Risk Stratification Score: None  Staffed with Messick, MD  Plan: On exam patient was in no acute distress and stable vitals.  EMR states that patient has a pulse rate of 108 however on my exam patient was nontachycardic.  Patient was able to bear weight and walk to the bathroom and was able to urinate without any issues.  Patient's physical exam was reassuring along with her neuro exam and patient did not have any midline tenderness however given patient's age a CT was ordered which showed DDD along with mild stenosis which would be causing patient's pain.  Patient will be encouraged to use Tylenol every 6 hours as needed for pain and to follow-up with the orthopedist.  Patient's daughter states they have Lidoderm patches at home and due to patient being 89 years old I am hesitant to give steroids or narcotics for concern of falls/fractures/worsening symptoms.  Patient was given return precautions. Patient stable for discharge at this time.  Patient verbalized understanding of plan.         Final Clinical Impression(s) / ED Diagnoses Final diagnoses:  Degenerative disc disease, lumbar  Spinal stenosis of lumbar region without neurogenic claudication    Rx / DC Orders ED Discharge Orders     None         Remi Deter 02/24/23 1538    Wynetta Fines, MD 02/24/23 1751

## 2023-02-24 NOTE — ED Provider Triage Note (Signed)
Emergency Medicine Provider Triage Evaluation Note  Julie Colon , a 89 y.o. female  was evaluated in triage.  Pt complains of back pain for the past month. Pain is bilateral throughout lower back and moving makes it worse. Patient is recently from [facility] in which she got injections for her pain. Patient denied urinary/bowel incontinence, saddle anesthesia, fevers, trauma, falls, chest pain, shob, abd pain, dysuria. Tylenol has not helped.  Review of Systems  Positive: See HPI Negative: See HPI  Physical Exam  BP (!) 155/87 (BP Location: Left Arm)   Pulse (!) 108   Temp 98.3 F (36.8 C) (Oral)   Resp 18   Wt 72.6 kg   SpO2 98%   BMI 32.32 kg/m  Gen:   Awake, no distress   Resp:  Normal effort  MSK:   Moves extremities without difficulty  Other:  No midline tenderness, tender in bilateral lumbar muscles, able to wiggle toes, 2+ bilateral DP pulses, sensation intact distally  Medical Decision Making  Medically screening exam initiated at 12:51 PM.  Appropriate orders placed.  Julie Colon was informed that the remainder of the evaluation will be completed by another provider, this initial triage assessment does not replace that evaluation, and the importance of remaining in the ED until their evaluation is complete.     Netta Corrigan, PA-C 02/24/23 1253

## 2023-02-24 NOTE — Discharge Instructions (Signed)
Please follow-up with the orthopedist I have attached here for you.  Today your CT shows that you have degenerative disc disease in your lower back along with mild stenosis which would explain your symptoms.  You may use your lidocaine patches at home along with Tylenol every 6 hours needed for pain.  If symptoms change or worsen please return to the ER.

## 2023-02-24 NOTE — ED Triage Notes (Addendum)
Low back pain into legs for over a month, difficulty getting up and walking.  Denies urinary symptoms.  Pt speaks [place] only

## 2023-02-24 NOTE — ED Notes (Signed)
Patient transported to CT 

## 2023-03-08 ENCOUNTER — Other Ambulatory Visit: Payer: Self-pay

## 2023-03-08 ENCOUNTER — Emergency Department (HOSPITAL_COMMUNITY)
Admission: EM | Admit: 2023-03-08 | Discharge: 2023-03-08 | Disposition: A | Discharge status: Home / Self Care | Payer: Medicare Other | Attending: Emergency Medicine | Admitting: Emergency Medicine

## 2023-03-08 ENCOUNTER — Emergency Department (HOSPITAL_COMMUNITY): Payer: Medicare Other

## 2023-03-08 DIAGNOSIS — M545 Low back pain, unspecified: Secondary | ICD-10-CM | POA: Diagnosis present

## 2023-03-08 DIAGNOSIS — R531 Weakness: Secondary | ICD-10-CM | POA: Insufficient documentation

## 2023-03-08 DIAGNOSIS — M48061 Spinal stenosis, lumbar region without neurogenic claudication: Secondary | ICD-10-CM

## 2023-03-08 LAB — COMPREHENSIVE METABOLIC PANEL
ALT: 12 U/L (ref 0–44)
AST: 28 U/L (ref 15–41)
Albumin: 3.4 g/dL — ABNORMAL LOW (ref 3.5–5.0)
Alkaline Phosphatase: 59 U/L (ref 38–126)
Anion gap: 12 (ref 5–15)
BUN: 17 mg/dL (ref 8–23)
CO2: 30 mmol/L (ref 22–32)
Calcium: 9.7 mg/dL (ref 8.9–10.3)
Chloride: 96 mmol/L — ABNORMAL LOW (ref 98–111)
Creatinine, Ser: 0.69 mg/dL (ref 0.44–1.00)
GFR, Estimated: >60 mL/min (ref >60)
Glucose, Bld: 126 mg/dL — ABNORMAL HIGH (ref 70–99)
Potassium: 4.2 mmol/L (ref 3.5–5.1)
Sodium: 138 mmol/L (ref 135–145)
Total Bilirubin: 0.5 mg/dL (ref 0.3–1.2)
Total Protein: 7.6 g/dL (ref 6.5–8.1)

## 2023-03-08 LAB — URINALYSIS, ROUTINE W REFLEX MICROSCOPIC
Bacteria, UA: NONE SEEN
Bilirubin Urine: NEGATIVE
Glucose, UA: NEGATIVE mg/dL
Hgb urine dipstick: NEGATIVE
Ketones, ur: NEGATIVE mg/dL
Nitrite: NEGATIVE
Protein, ur: NEGATIVE mg/dL
Specific Gravity, Urine: 1.008 (ref 1.005–1.030)
pH: 7 (ref 5.0–8.0)

## 2023-03-08 LAB — CBC WITH DIFFERENTIAL/PLATELET
Abs Immature Granulocytes: 0.04 10*3/uL (ref 0.00–0.07)
Basophils Absolute: 0 10*3/uL (ref 0.0–0.1)
Basophils Relative: 0 %
Eosinophils Absolute: 0.3 10*3/uL (ref 0.0–0.5)
Eosinophils Relative: 3 %
HCT: 36.6 % (ref 36.0–46.0)
Hemoglobin: 11.7 g/dL — ABNORMAL LOW (ref 12.0–15.0)
Immature Granulocytes: 0 %
Lymphocytes Relative: 36 %
Lymphs Abs: 3.3 10*3/uL (ref 0.7–4.0)
MCH: 29.3 pg (ref 26.0–34.0)
MCHC: 32 g/dL (ref 30.0–36.0)
MCV: 91.7 fL (ref 80.0–100.0)
Monocytes Absolute: 0.7 10*3/uL (ref 0.1–1.0)
Monocytes Relative: 8 %
Neutro Abs: 4.8 10*3/uL (ref 1.7–7.7)
Neutrophils Relative %: 53 %
Platelets: 342 10*3/uL (ref 150–400)
RBC: 3.99 MIL/uL (ref 3.87–5.11)
RDW: 13 % (ref 11.5–15.5)
WBC: 9.2 10*3/uL (ref 4.0–10.5)
nRBC: 0 % (ref 0.0–0.2)

## 2023-03-08 MED ORDER — CYCLOBENZAPRINE HCL 10 MG PO TABS: 5.0000 mg | ORAL_TABLET | Freq: Two times a day (BID) | ORAL | 0 refills | Status: DC | PRN

## 2023-03-08 NOTE — ED Triage Notes (Addendum)
Patient reports low back pain onset last week unrelieved by OTC pain medication, denies injury or urinary discomfort . Patient speaks [place]. Hypertensive at triage .

## 2023-03-08 NOTE — ED Provider Notes (Signed)
[place] EMERGENCY DEPARTMENT AT [facility] Provider Note   CSN: [id number] Arrival date & time: 03/08/23  0515     History  Chief Complaint  Patient presents with  . Back Pain    Julie Colon is a 89 y.o. female.  The history is provided by the patient and a relative. Language interpreter used: [place] - [phone].  Back Pain Associated symptoms: weakness   Associated symptoms: no fever   Pt reports back pain that is not responding to ASA/APAP She reports she has stopped taking OTC meds for now Pt reports pain is worsening and harder to walk and feels weaker Pt reports having pain for years but worse over past month No falls/trauma No other acute complaints   No previous surgery She was supposed to have MRI today but couldn't find the address  Home Medications Prior to Admission medications   Medication Sig Start Date End Date Taking? Authorizing Provider  Accu-Chek FastClix Lancets MISC UAD 04/20/20   Marcine Matar, MD  acetaminophen (TYLENOL) 325 MG tablet Take 650 mg by mouth every 6 (six) hours as needed.    [provider]  albuterol (VENTOLIN HFA) 108 (90 Base) MCG/ACT inhaler Inhale into the lungs every 6 (six) hours as needed for wheezing or shortness of breath.    [provider]  amLODipine (NORVASC) 10 MG tablet Take 10 mg by mouth daily.    [provider]  bisacodyl (FLEET) 10 MG/30ML ENEM Place 10 mg rectally once. As needed for severe constipation.    [provider]  Blood Glucose Monitoring Suppl (ACCU-CHEK GUIDE ME) w/Device KIT UAD 04/20/20   Marcine Matar, MD  bumetanide (BUMEX) 1 MG tablet Take 1 tablet (1 mg total) by mouth daily. 03/04/20 04/03/20  Jerald Kief, MD  cholecalciferol (VITAMIN D3) 25 MCG (1000 UNIT) tablet Take 1,000 Units by mouth in the morning and at bedtime.    [provider]  donepezil (ARICEPT) 5 MG tablet TAKE 1 TABLET (5 MG TOTAL) BY MOUTH AT BEDTIME. 09/29/20  09/29/21  Marcine Matar, MD  famotidine (PEPCID) 20 MG tablet Take 20 mg by mouth 2 (two) times daily.    [provider]  ferrous sulfate 325 (65 FE) MG tablet TAKE 1 TABLET (325 MG TOTAL) BY MOUTH DAILY WITH BREAKFAST. 09/29/20 09/29/21  Marcine Matar, MD  glucose blood (ACCU-CHEK GUIDE) test strip Check BS once a day 04/20/20   Marcine Matar, MD  hydrALAZINE (APRESOLINE) 100 MG tablet Take 100 mg by mouth 2 (two) times daily.    [provider]  hydrochlorothiazide (HYDRODIURIL) 25 MG tablet Take 25 mg by mouth daily.    [provider]  insulin isophane & regular human (HUMULIN 70/30 MIX) (70-30) 100 UNIT/ML KwikPen INJECT 6 UNITS INTO THE SKIN 2 (TWO) TIMES DAILY. 09/29/20 09/29/21  Marcine Matar, MD  losartan (COZAAR) 100 MG tablet TAKE 1 TABLET (100 MG TOTAL) BY MOUTH DAILY. 09/29/20 09/29/21  Marcine Matar, MD  metFORMIN (GLUCOPHAGE) 500 MG tablet TAKE 1 TABLET (500 MG TOTAL) BY MOUTH 2 (TWO) TIMES DAILY WITH A MEAL. 09/29/20 09/29/21  Marcine Matar, MD  metoprolol tartrate (LOPRESSOR) 25 MG tablet TAKE 1 TABLET (25 MG TOTAL) BY MOUTH 2 (TWO) TIMES DAILY. 09/29/20 09/29/21  Marcine Matar, MD  omeprazole (PRILOSEC) 20 MG capsule TAKE 1 CAPSULE (20 MG TOTAL) BY MOUTH DAILY. 09/29/20 09/29/21  Marcine Matar, MD  PEG-KCl-NaCl-NaSulf-Na Asc-C (PLENVU) 140 g SOLR  Take 140 g by mouth as directed. 01/03/21   Meredith Pel, NP  sertraline (ZOLOFT) 50 MG tablet TAKE 1/2 TABLET BY MOUTH DAILY 10/01/20 10/01/21  Marcine Matar, MD      Allergies    Patient has no known allergies.    Review of Systems   Review of Systems  Constitutional:  Negative for fever.  Gastrointestinal:  Negative for vomiting.  Musculoskeletal:  Positive for back pain.  Neurological:  Positive for weakness.    Physical Exam Updated Vital Signs BP (!) 161/73   Pulse 77   Temp 98.9 F (37.2 C)   Resp 18   SpO2 97%  Physical Exam CONSTITUTIONAL: Elderly, no  acute distress HEAD: Normocephalic/atraumatic EYES: EOMI/PERRL ENMT: Mucous membranes moist NECK: supple no meningeal signs SPINE/BACK: No cervical or thoracic tenderness.  Minimal paraspinal lumbar tenderness No bruising/crepitance/stepoffs noted to spine Dry skin noted CV: S1/S2 noted, no murmurs/rubs/gallops noted LUNGS: Lungs are clear to auscultation bilaterally, no apparent distress ABDOMEN: soft, nontender, no rebound or guarding GU:no cva tenderness NEURO: Awake/alert, equal motor 5/5 strength noted with the following: hip flexion/knee flexion/extension, foot dorsi/plantar flexion, great toe extension intact bilaterally, no sensory deficit in any dermatome.   EXTREMITIES: pulses normal, full ROM SKIN: warm, color normal   ED Results / Procedures / Treatments   Labs (all labs ordered are listed, but only abnormal results are displayed) Labs Reviewed  URINALYSIS, ROUTINE W REFLEX MICROSCOPIC - Abnormal; Notable for the following components:      Result Value   Color, Urine STRAW (*)    Leukocytes,Ua TRACE (*)    All other components within normal limits  CBC WITH DIFFERENTIAL/PLATELET - Abnormal; Notable for the following components:   Hemoglobin 11.7 (*)    All other components within normal limits  COMPREHENSIVE METABOLIC PANEL - Abnormal; Notable for the following components:   Chloride 96 (*)    Glucose, Bld 126 (*)    Albumin 3.4 (*)    All other components within normal limits    EKG None  Radiology No results found.  Procedures Procedures    Medications Ordered in ED Medications - No data to display  ED Course/ Medical Decision Making/ A&P Clinical Course as of 03/08/23 0714  Sat Mar 08, 2023  1610 Patient presents with low back pain that she reports having for years but worsened over the past month.  Per the daughter, there was was to get an MRI this morning somewhere else, but was unable to find the location. She currently reports pain is improving  and declines pain medicines.  However she does endorse recent worsening of pain at home and difficulty walking.  Previous CT imaging has revealed spinal stenosis. Will order MRI [DW]  0612 History limited due to language barrier, but utilized interpreter for entire history and physical [DW]  0705 Signed out to dr Eloise Harman at shift change with MRI pending [DW]  0712 Assumed care from Dr Bebe Shaggy. 89 yo F with chronic back pain who has chronic back pain that is worsened over the past month was ordered for an outpatient MRI but was unable to find a centimeter so came to the emergency department for evaluation.  No red flags on history.  No neurodeficits on her exam today.  Will obtain MRI today.  Patient [place] speaking only. [RP]    Clinical Course User Index [DW] Zadie Rhine, MD [RP] Rondel Baton, MD  Medical Decision Making Amount and/or Complexity of Data Reviewed Labs: ordered. Radiology: ordered.   This patient presents to the ED for concern of back pain, this involves an extensive number of treatment options, and is a complaint that carries with it a high risk of complications and morbidity.  The differential diagnosis includes but is not limited to lumbosacral radiculopathy, epidural abscess, discitis, pyonephritis, ureteral stone, AAA  Comorbidities that complicate the patient evaluation: Patient's presentation is complicated by their history of hypertension  Social Determinants of Health: Patient's  limited English proficiency   increases the complexity of managing their presentation  Additional history obtained: Additional history obtained from family Records reviewed Care Everywhere/External Records  Medicines ordered and prescription drug management: Patient declines pain medicine  Complexity of problems addressed: Patient's presentation is most consistent with  exacerbation of chronic illness            Final Clinical  Impression(s) / ED Diagnoses Final diagnoses:  Acute low back pain, unspecified back pain laterality, unspecified whether sciatica present    Rx / DC Orders ED Discharge Orders     None         Zadie Rhine, MD 03/08/23 [phone]

## 2023-03-08 NOTE — ED Provider Notes (Signed)
  Physical Exam  BP (!) 161/73   Pulse 77   Temp 98.9 F (37.2 C)   Resp 18   SpO2 97%   Physical Exam  Procedures  Procedures  ED Course / MDM   Clinical Course as of 03/08/23 0716  Sat Mar 08, 2023  4540 Patient presents with low back pain that she reports having for years but worsened over the past month.  Per the daughter, there was was to get an MRI this morning somewhere else, but was unable to find the location. She currently reports pain is improving and declines pain medicines.  However she does endorse recent worsening of pain at home and difficulty walking.  Previous CT imaging has revealed spinal stenosis. Will order MRI [DW]  0612 History limited due to language barrier, but utilized interpreter for entire history and physical [DW]  0705 Signed out to dr Eloise Harman at shift change with MRI pending [DW]  0712 Assumed care from Dr Bebe Shaggy. 89 yo F with chronic back pain who has chronic back pain that is worsened over the past month was ordered for an outpatient MRI but was unable to find a centimeter so came to the emergency department for evaluation.  No red flags on history.  No neurodeficits on her exam today.  Will obtain MRI today.  Patient Creole speaking only. [RP]    Clinical Course User Index [DW] Zadie Rhine, MD [RP] Rondel Baton, MD   Medical Decision Making Amount and/or Complexity of Data Reviewed Labs: ordered. Radiology: ordered.   ***

## 2023-03-08 NOTE — ED Notes (Signed)
MRI stated pt will be transported shortly

## 2023-03-08 NOTE — Discharge Instructions (Addendum)
Ou te w pou doul nan do ou nan depatman ijans la.   [employer], tanpri itilize Tylenol, ibipwofn, plak lidokayin, ak cyclobenzaprine nou te preskri ou jan sa neses pou doul.  Pa pran cyclobenzaprine anvan ou kondwi oswa opere machin lou.  Swiv ak dokt prensipal ou nan 2-3 jou [place] vizit ou a.  Swiv ak klinik koln vtebral la pi vit posib konsnan sentm ou yo.  Ltrason ou a te montre yon pawa matris ep (endomt).  Tanpri swiv OB/GYN Iris Pert a paske li ka mande pou yon byopsi  Retounen imedyatman nan depatman ijans si ou santi nenpt [place] yo: pt sansasyon oswa febls nan janm ou, entesten oswa enkonvenyans nan blad pipi, pt sansasyon pandan w ap siye apre w fin f kaka oswa pipi, oswa nenpt lt sentm ki konsne.    Msi paske w te vizite Depatman Ijans nou an. Se te yon plezi pran swen w jodi a.  ----  You were seen for your back pain in the emergency department.   At home, please use Tylenol, ibuprofen, lidocaine patches, and the cyclobenzaprine we have prescribed you as needed for pain.  Do not take the cyclobenzaprine before driving or operating heavy machinery.  Follow-up with your primary doctor in 2-3 days regarding your visit.  Follow-up with the spine clinic as soon as possible regarding your symptoms.  Your ultrasound showed a thickened uterine lining (endometrium).  Please follow-up with OB/GYN regarding this since it may require a biopsy  Return immediately to the emergency department if you experience any of the following: Numbness or weakness of your legs, bowel or bladder incontinence, numbness while wiping after pooping or urinating, or any other concerning symptoms.    Thank you for visiting our Emergency Department. It was a pleasure taking care of you today.

## 2023-03-08 NOTE — ED Notes (Signed)
Pt ambulated to restroom without incident.

## 2023-03-08 NOTE — ED Notes (Signed)
RN attempted to call MRI for update no answer at this time

## 2023-03-28 ENCOUNTER — Ambulatory Visit: Payer: Medicare Other | Attending: Orthopaedic Surgery | Admitting: Physical Therapy

## 2023-03-28 DIAGNOSIS — M5459 Other low back pain: Secondary | ICD-10-CM | POA: Diagnosis present

## 2023-03-28 DIAGNOSIS — M6281 Muscle weakness (generalized): Secondary | ICD-10-CM | POA: Diagnosis present

## 2023-03-28 DIAGNOSIS — M5416 Radiculopathy, lumbar region: Secondary | ICD-10-CM | POA: Diagnosis present

## 2023-03-28 NOTE — Therapy (Signed)
OUTPATIENT PHYSICAL THERAPY THORACOLUMBAR EVALUATION   Patient Name: Julie Colon MRN: [id number] DOB:July 28, 1934, 89 y.o., female Today's Date: 03/28/2023  END OF SESSION:  PT End of Session - 03/28/23 0800     Visit Number 1    Number of Visits 5   with eval   Date for PT Re-Evaluation 05/09/23   to allow for scheduling delays   Authorization Type Medicare    PT Start Time 0800    PT Stop Time 0830   eval   PT Time Calculation (min) 30 min    Activity Tolerance Patient limited by pain    Behavior During Therapy [facility] for tasks assessed/performed             Past Medical History:  Diagnosis Date   Asthma    Constipation    Dementia (HCC)    Diabetes mellitus without complication (HCC)    GERD (gastroesophageal reflux disease)    Hypertension    Rectal prolapse    Past Surgical History:  Procedure Laterality Date   none     Patient Active Problem List   Diagnosis Date Noted   Dementia with behavioral disturbance (HCC) 10/01/2020   Constipation 10/01/2020   Gastroesophageal reflux disease without esophagitis 04/20/2020   Iron deficiency 04/20/2020   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   Essential hypertension 03/20/2020   Hyponatremia 03/02/2020   Sepsis due to pneumonia (HCC) 03/02/2020   Noncompliance 03/02/2020   Leukocytosis 03/02/2020   Advanced age 49/09/2019   Pneumonia 03/02/2020   Hyperglycemic hyperosmolar nonketotic coma (HCC) 01/14/2020   Type 2 diabetes mellitus with complication, without long-term current use of insulin (HCC) 01/14/2020   Hyperkalemia 01/14/2020   AKI (acute kidney injury) (HCC) 01/14/2020    PCP: Jonah Blue, MD  REFERRING PROVIDER: Marcene Corning, MD  REFERRING DIAG: M54.50 (ICD-10-CM) - Low back pain M48.00 (ICD-10-CM) - Spinal stenosis  Rationale for Evaluation and Treatment: Rehabilitation  THERAPY DIAG:  Muscle weakness (generalized)  Other low back pain  Radiculopathy, lumbar region  ONSET DATE:  03/18/2023 (referral date)  SUBJECTIVE:                                                                                                                                                                                           SUBJECTIVE STATEMENT: Pt reports she has had back pain since she was young when she injured her back but recently it has gotten worse. The pain is so bad that patient spends most of the time in bed. Patient has never tried PT before to address her back pain. Pt prefers to sleep in prone, increased pain in supine, difficulty  getting in/out of bed due to pain.  Accompanied by: Daughter-[name] who provides interpretation  PERTINENT HISTORY:  past medical history significant for hypertension, diabetes mellitus, asthma, dementia, GERD and remote history of smoking  PAIN:  Are you having pain? Yes: NPRS scale: 5/10 Pain location: low back into thighs, has numbness in legs Pain description: achy, numbness into legs Aggravating factors: sitting for a long period of time Relieving factors: Tylenol and Zoloft have helped a little bit  PRECAUTIONS: Fall  RED FLAGS: Bowel or bladder incontinence: Yes: very rare but has had some B/B incontinence    WEIGHT BEARING RESTRICTIONS: No  FALLS:  Has patient fallen in last 6 months? No  LIVING ENVIRONMENT: Lives with: lives with their family Lives in: House/apartment Pt stays on first floor, no issues with stairs  OCCUPATION: retired  PLOF: Independent with gait and Independent with transfers  PATIENT GOALS: "to make back better, decrease pain"  NEXT MD VISIT: supposed to get an injection in back from Dr. Jerl Santos, office to call and schedule  OBJECTIVE:   DIAGNOSTIC FINDINGS:  Lumbar Spine MRI 03/08/2023 Disc levels:   T12- L1: Unremarkable.   L1-L2: Disc narrowing and bulging with mild facet spurring.   L2-L3: Disc narrowing and endplate degeneration with circumferential disc bulging and endplate spurring. Mild  bilateral foraminal narrowing. Mild to moderate spinal stenosis   L3-L4: Disc narrowing and circumferential bulging. Facet spurring and ligamentum flavum thickening. Mild-to-moderate spinal stenosis.   L4-L5: Disc narrowing and bulging with posterior annular fissure. Facet spurring and ligamentum flavum thickening. Mild triangular narrowing of the thecal sac. The foramina are patent   L5-S1:Disc narrowing with endplate and facet spurring. The canal and foramina are patent.   IMPRESSION: 1. Marrow edema at the L5 superior endplate, likely reactive to chronic degeneration and remote superior endplate fracture when correlated with recent CT. 2. Generalized lumbar spine degeneration with retrolisthesis at L2-3. There is mild to moderate spinal stenosis at L2-3 and L3-4. 3. 7 mm endometrial thickness is notable for age, correlate for abnormal uterine bleeding.  PATIENT SURVEYS:  Modified Oswestry 48/50   SCREENING FOR RED FLAGS: Bowel or bladder incontinence: Yes: very rare but has had a recent episode of B/B incontinence in bed and pt was unaware Spinal tumors: No Cauda equina syndrome: No Compression fracture: No Abdominal aneurysm: No  COGNITION: Overall cognitive status: Difficulty to assess due to: language barrier      SENSATION: N/T in BLE (down posterior thighs)  POSTURE: rounded shoulders and forward head  PALPATION: TTP in paraspinals in lower back but pain travels all the way up spine Muscle tightness in paraspinals all along spine, lower back > mid and upper back  LUMBAR ROM:   AROM eval  Flexion WFL, very painful  Extension 50%, mildy painful  Right lateral flexion 25%  Left lateral flexion 25%  Right rotation [facility] but very painful  Left rotation WFL but very painful   (Blank rows = not tested)  LOWER EXTREMITY ROM:     Active  Right eval Left eval  Hip flexion Decreased due to back pain Decreased due to back pain  Hip extension    Hip abduction     Hip adduction    Hip internal rotation    Hip external rotation    Knee flexion    Knee extension [facility]  Ankle dorsiflexion    Ankle plantarflexion    Ankle inversion    Ankle eversion     (Blank rows = not  tested)  LOWER EXTREMITY MMT:    MMT Right eval Left eval  Hip flexion 3 4  Hip extension    Hip abduction    Hip adduction    Hip internal rotation    Hip external rotation    Knee flexion 4 4  Knee extension 4 4  Ankle dorsiflexion 4 4  Ankle plantarflexion    Ankle inversion    Ankle eversion     (Blank rows = not tested)  LUMBAR SPECIAL TESTS:  Slump test: Positive Unable to lift up LE for SLR due to hip flex and quad weakness  GAIT: Distance walked: various clinic distances Assistive device utilized: Single point cane Level of assistance: CGA Comments: frequent reaching out for wall to steady herself, per dtr should be using a RW but only uses a SPC  TODAY'S TREATMENT:                                                                                                                              PT Evaluation    PATIENT EDUCATION:  Education details: Eval findings, PT POC Person educated: Patient and Child(ren) Education method: Medical illustrator Education comprehension: verbalized understanding, returned demonstration, and needs further education  HOME EXERCISE PROGRAM: To be initiated  ASSESSMENT:  CLINICAL IMPRESSION: Patient is a 89 y.o. female who was seen today for physical therapy evaluation and treatment for low back pain due to spinal stenosis.  Pt's PMH is significant for: past medical history significant for hypertension, diabetes mellitus, asthma, dementia, GERD and remote history of smoking The following deficits were present during the exam: decreased LE strength, decreased lumbar ROM, pain. Based on balance impairments during gait, pt is an increased risk for falls. Pt would benefit from skilled PT to address these  impairments and functional limitations to maximize functional mobility independence.   OBJECTIVE IMPAIRMENTS: Abnormal gait, decreased activity tolerance, decreased balance, decreased knowledge of condition, decreased knowledge of use of DME, decreased mobility, difficulty walking, decreased ROM, decreased strength, increased fascial restrictions, impaired perceived functional ability, impaired sensation, improper body mechanics, postural dysfunction, and pain.   ACTIVITY LIMITATIONS: carrying, lifting, bending, sitting, standing, stairs, transfers, bed mobility, and continence  PARTICIPATION LIMITATIONS: meal prep, cleaning, laundry, driving, and community activity  PERSONAL FACTORS: Age, Time since onset of injury/illness/exacerbation, and 3+ comorbidities:    past medical history significant for hypertension, diabetes mellitus, asthma, dementia, GERD and remote history of smokingare also affecting patient's functional outcome.   REHAB POTENTIAL: Fair time since back injury  CLINICAL DECISION MAKING: Stable/uncomplicated  EVALUATION COMPLEXITY: Low   GOALS: Goals reviewed with patient? Yes  SHORT TERM GOALS=LONG TERM GOALS: due to length of POC   LONG TERM GOALS: Target date: 05/06/2023  Pt will be independent with final HEP for improved strength, balance, transfers and gait and for increased independence with management of pain symptoms. Baseline:  Goal status: INITIAL  2.  Pt will improve her score on the  Oswestry to </= 43/50 to demonstrate decreased disability level. Baseline: 48/50 Goal status: INITIAL  3.  Pt will be able to perform bed mobility with no more than Supervision assist Baseline: min A at times due to pain (7/26) Goal status: INITIAL   PLAN:  PT FREQUENCY: 1x/week  PT DURATION: 4 weeks  PLANNED INTERVENTIONS: Therapeutic exercises, Therapeutic activity, Neuromuscular re-education, Balance training, Gait training, Patient/Family education, Self Care,  Joint mobilization, DME instructions, Aquatic Therapy, Dry Needling, Electrical stimulation, Spinal manipulation, Spinal mobilization, Cryotherapy, Moist heat, Taping, Traction, Manual therapy, and Re-evaluation.  PLAN FOR NEXT SESSION: initiate HEP for core strengthening/stability: post pelvic tilts, TA contract with supine exercises, sciatic nerve glides if tolerated, hip flexor and quad strengthening (SAQ, SKTC?) if pt can tolerate supine position, prone press-ups, childs pose, LTR    Peter Congo, PT, DPT, CSRS 03/28/2023, 8:36 AM

## 2023-04-03 ENCOUNTER — Other Ambulatory Visit: Payer: Self-pay

## 2023-04-03 ENCOUNTER — Ambulatory Visit: Payer: 59 | Attending: Physician Assistant | Admitting: Physician Assistant

## 2023-04-03 ENCOUNTER — Encounter: Payer: Self-pay | Admitting: Physician Assistant

## 2023-04-03 VITALS — BP 154/93 | HR 130 | Wt 137.2 lb

## 2023-04-03 DIAGNOSIS — K219 Gastro-esophageal reflux disease without esophagitis: Secondary | ICD-10-CM | POA: Diagnosis not present

## 2023-04-03 DIAGNOSIS — E559 Vitamin D deficiency, unspecified: Secondary | ICD-10-CM | POA: Insufficient documentation

## 2023-04-03 DIAGNOSIS — R609 Edema, unspecified: Secondary | ICD-10-CM | POA: Diagnosis not present

## 2023-04-03 DIAGNOSIS — Z794 Long term (current) use of insulin: Secondary | ICD-10-CM | POA: Insufficient documentation

## 2023-04-03 DIAGNOSIS — E1165 Type 2 diabetes mellitus with hyperglycemia: Secondary | ICD-10-CM | POA: Diagnosis not present

## 2023-04-03 DIAGNOSIS — M549 Dorsalgia, unspecified: Secondary | ICD-10-CM | POA: Diagnosis not present

## 2023-04-03 DIAGNOSIS — E785 Hyperlipidemia, unspecified: Secondary | ICD-10-CM | POA: Diagnosis not present

## 2023-04-03 DIAGNOSIS — I10 Essential (primary) hypertension: Secondary | ICD-10-CM | POA: Diagnosis not present

## 2023-04-03 DIAGNOSIS — Z79899 Other long term (current) drug therapy: Secondary | ICD-10-CM | POA: Insufficient documentation

## 2023-04-03 DIAGNOSIS — Z7982 Long term (current) use of aspirin: Secondary | ICD-10-CM | POA: Insufficient documentation

## 2023-04-03 DIAGNOSIS — Z7984 Long term (current) use of oral hypoglycemic drugs: Secondary | ICD-10-CM | POA: Diagnosis not present

## 2023-04-03 LAB — GLUCOSE, POCT (MANUAL RESULT ENTRY): POC Glucose: 285 mg/dl — AB (ref 70–99)

## 2023-04-03 LAB — POCT GLYCOSYLATED HEMOGLOBIN (HGB A1C): HbA1c, POC (controlled diabetic range): 6.3 % (ref 0.0–7.0)

## 2023-04-03 MED ORDER — METFORMIN HCL 500 MG PO TABS
500.0000 mg | ORAL_TABLET | Freq: Two times a day (BID) | ORAL | 5 refills | Status: DC
Start: 2023-04-03 — End: 2024-01-24
  Filled 2023-04-03: qty 60, 30d supply, fill #0
  Filled 2023-05-07: qty 60, 30d supply, fill #1
  Filled 2023-06-10: qty 60, 30d supply, fill #2
  Filled 2023-06-23 – 2023-07-14 (×2): qty 60, 30d supply, fill #3
  Filled 2023-08-26 (×2): qty 60, 30d supply, fill #4
  Filled 2023-10-01: qty 60, 30d supply, fill #5

## 2023-04-03 MED ORDER — VITAMIN D 25 MCG (1000 UNIT) PO TABS
1000.0000 [IU] | ORAL_TABLET | Freq: Two times a day (BID) | ORAL | 2 refills | Status: DC
Start: 2023-04-03 — End: 2023-11-08
  Filled 2023-04-03: qty 30, 15d supply, fill #0

## 2023-04-03 MED ORDER — ROSUVASTATIN CALCIUM 10 MG PO TABS
10.0000 mg | ORAL_TABLET | Freq: Every day | ORAL | 3 refills | Status: DC
Start: 2023-04-03 — End: 2023-11-18
  Filled 2023-04-03: qty 90, 90d supply, fill #0
  Filled 2023-06-23: qty 90, 90d supply, fill #1
  Filled 2023-10-01: qty 90, 90d supply, fill #2

## 2023-04-03 MED ORDER — PANTOPRAZOLE SODIUM 20 MG PO TBEC
20.0000 mg | DELAYED_RELEASE_TABLET | Freq: Every day | ORAL | 3 refills | Status: DC
Start: 2023-04-03 — End: 2023-08-26
  Filled 2023-04-03: qty 30, 30d supply, fill #0
  Filled 2023-05-07: qty 30, 30d supply, fill #1
  Filled 2023-06-10: qty 30, 30d supply, fill #2
  Filled 2023-06-23 – 2023-07-14 (×2): qty 30, 30d supply, fill #3

## 2023-04-03 MED ORDER — MEDICAL COMPRESSION STOCKINGS MISC
2.0000 | Freq: Every day | 3 refills | Status: DC
Start: 2023-04-03 — End: 2023-11-08
  Filled 2023-04-03: qty 2, fill #0

## 2023-04-03 MED ORDER — HYDRALAZINE HCL 100 MG PO TABS
100.0000 mg | ORAL_TABLET | Freq: Three times a day (TID) | ORAL | 1 refills | Status: DC
Start: 2023-04-03 — End: 2023-10-01
  Filled 2023-04-03: qty 270, 90d supply, fill #0
  Filled 2023-06-23: qty 270, 90d supply, fill #1

## 2023-04-03 MED ORDER — AMLODIPINE BESYLATE 10 MG PO TABS
10.0000 mg | ORAL_TABLET | Freq: Every day | ORAL | 1 refills | Status: DC
Start: 2023-04-03 — End: 2023-10-01
  Filled 2023-04-03: qty 90, 90d supply, fill #0
  Filled 2023-06-23: qty 90, 90d supply, fill #1

## 2023-04-03 MED ORDER — ASPIRIN 81 MG PO TBEC
81.0000 mg | DELAYED_RELEASE_TABLET | Freq: Every day | ORAL | Status: DC
Start: 2023-04-03 — End: 2023-11-18

## 2023-04-03 MED ORDER — LANTUS SOLOSTAR 100 UNIT/ML ~~LOC~~ SOPN
20.0000 [IU] | PEN_INJECTOR | Freq: Every day | SUBCUTANEOUS | 3 refills | Status: DC
Start: 2023-04-03 — End: 2023-10-06
  Filled 2023-04-03: qty 15, 75d supply, fill #0
  Filled 2023-06-23: qty 15, 75d supply, fill #1
  Filled 2023-09-15: qty 15, 75d supply, fill #2

## 2023-04-03 MED ORDER — METOPROLOL TARTRATE 25 MG PO TABS
25.0000 mg | ORAL_TABLET | Freq: Two times a day (BID) | ORAL | 5 refills | Status: DC
Start: 2023-04-03 — End: 2023-12-31
  Filled 2023-04-03: qty 60, 30d supply, fill #0
  Filled 2023-05-07: qty 60, 30d supply, fill #1
  Filled 2023-06-10: qty 60, 30d supply, fill #2
  Filled 2023-06-23 – 2023-07-14 (×2): qty 60, 30d supply, fill #3
  Filled 2023-09-15: qty 60, 30d supply, fill #4
  Filled 2023-11-07: qty 60, 30d supply, fill #5

## 2023-04-05 NOTE — Progress Notes (Signed)
Patient ID: Julie Colon, female   DOB: [birth year], 89 y.o.   MRN: [id number]   Julie Colon, is a 89 y.o. female  KGM:[id number]  [id number]:[id number]  DOB - Sep 06, 1933  Chief Complaint  Patient presents with   Back Pain   Medication Refill       Subjective:   Julie Colon is a 89 y.o. female here today to re-establish care.  she was previously enrolled in [facility] but now health has declined and she is unable to participate. She is being followed by Dr Yisroel Ramming and PT for back pain and disc degeneration.  She is pending injections into her back.  She is needing to re-establish care and get RF on all her meds. They are looking for any home help/health/any type of resources that might be available. She was being followed medically there and is coming back to [place] to re-establish care. she is living with her daughter and the daughter works.  Daughter has all current medications with her.  She is compliant with meds.  Daughter has to organize and administer all meds and care.  Patient unable to care for herself, dress herself, cook for herself or do ADL on her own.  She had cataract surgeryon her L eye about 1 week ago.  Patient is french speaking.  Declined interpreters and daughter translated.     No problems updated.  ALLERGIES: No Known Allergies  PAST MEDICAL HISTORY: Past Medical History:  Diagnosis Date   Asthma    Constipation    Dementia (HCC)    Diabetes mellitus without complication (HCC)    GERD (gastroesophageal reflux disease)    Hypertension    Rectal prolapse     MEDICATIONS AT HOME: Prior to Admission medications   Medication Sig Start Date End Date Taking? Authorizing Provider  Accu-Chek FastClix Lancets MISC UAD 04/20/20  Yes Marcine Matar, MD  acetaminophen (TYLENOL) 325 MG tablet Take 650 mg by mouth every 6 (six) hours as needed.   Yes [provider]  albuterol (VENTOLIN HFA) 108 (90 Base) MCG/ACT inhaler Inhale into the lungs every 6 (six) hours as  needed for wheezing or shortness of breath.   Yes [provider]  aspirin EC 81 MG tablet Take 1 tablet (81 mg total) by mouth daily. Swallow whole. 04/03/23  Yes Anahid Eskelson M, PA-C  bisacodyl (FLEET) 10 MG/30ML ENEM Place 10 mg rectally once. As needed for severe constipation.   Yes [provider]  Blood Glucose Monitoring Suppl (ACCU-CHEK GUIDE ME) w/Device KIT UAD 04/20/20  Yes Marcine Matar, MD  cyclobenzaprine (FLEXERIL) 10 MG tablet Take 0.5 tablets (5 mg total) by mouth 2 (two) times daily as needed for muscle spasms. 03/08/23  Yes Rondel Baton, MD  Elastic Bandages & Supports (MEDICAL COMPRESSION STOCKINGS) MISC Use 2 stockings daily. 04/03/23  Yes Georgian Co M, PA-C  glucose blood (ACCU-CHEK GUIDE) test strip Check BS once a day 04/20/20  Yes Marcine Matar, MD  insulin glargine (LANTUS SOLOSTAR) 100 UNIT/ML Solostar Pen Inject 20 Units into the skin daily. 04/03/23  Yes Georgian Co M, PA-C  pantoprazole (PROTONIX) 20 MG tablet Take 1 tablet (20 mg total) by mouth daily. 04/03/23  Yes Anders Simmonds, PA-C  PEG-KCl-NaCl-NaSulf-Na Asc-C (PLENVU) 140 g SOLR Take 140 g by mouth as directed. 01/03/21  Yes Meredith Pel, NP  rosuvastatin (CRESTOR) 10 MG tablet Take 1 tablet (10 mg total) by mouth daily. 04/03/23  Yes Georgian Co M, PA-C  amLODipine (NORVASC)  10 MG tablet Take 1 tablet (10 mg total) by mouth daily. 04/03/23   Anders Simmonds, PA-C  cholecalciferol (VITAMIN D3) 25 MCG (1000 UNIT) tablet Take 1 tablet (1,000 Units total) by mouth in the morning and at bedtime. 04/03/23   Anders Simmonds, PA-C  ferrous sulfate 325 (65 FE) MG tablet TAKE 1 TABLET (325 MG TOTAL) BY MOUTH DAILY WITH BREAKFAST. 09/29/20 09/29/21  Marcine Matar, MD  hydrALAZINE (APRESOLINE) 100 MG tablet Take 1 tablet (100 mg total) by mouth 3 (three) times daily. 04/03/23   Anders Simmonds, PA-C  metFORMIN (GLUCOPHAGE) 500 MG tablet Take 1 tablet (500 mg total) by mouth 2  (two) times daily with a meal. 04/03/23   Tavion Senkbeil, Marzella Schlein, PA-C  metoprolol tartrate (LOPRESSOR) 25 MG tablet Take 1 tablet (25 mg total) by mouth 2 (two) times daily. 04/03/23   Anders Simmonds, PA-C    ROS: Neg HEENT Neg resp Neg cardiac Neg GI Neg GU Neg psych   Objective:   Vitals:   04/03/23 1522 04/03/23 1528  BP: (!) 166/94 (!) 154/93  Pulse: (!) 132 (!) 130  SpO2: 98%   Weight: 137 lb 3.2 oz (62.2 kg)    Exam General appearance : Awake, alert, not in any distress. Speech Clear. Not toxic looking HEENT: Atraumatic and Normocephalic Neck: Supple, no JVD. No cervical lymphadenopathy.  Chest: Good air entry bilaterally, CTAB.  No rales/rhonchi/wheezing CVS: S1 S2 regular, no murmurs.  Extremities: B/L Lower Ext shows no edema, both legs are warm to touch Neurology: Awake alert, and oriented X 3, CN II-XII intact, Non focal Skin: No Rash  Data Review Lab Results  Component Value Date   HGBA1C 6.3 04/03/2023   HGBA1C 12.0 (A) 09/29/2020   HGBA1C 7.9 (A) 03/20/2020    Assessment & Plan   1. Type 2 diabetes mellitus with hyperglycemia, unspecified whether long term insulin use (HCC) controlled - Glucose (CBG) - HgB A1c - insulin glargine (LANTUS SOLOSTAR) 100 UNIT/ML Solostar Pen; Inject 20 Units into the skin daily.  Dispense: 15 mL; Refill: 3 - metFORMIN (GLUCOPHAGE) 500 MG tablet; Take 1 tablet (500 mg total) by mouth 2 (two) times daily with a meal.  Dispense: 60 tablet; Refill: 5  2. Essential hypertension She was out of metoprolol and has not taken in a few days - metoprolol tartrate (LOPRESSOR) 25 MG tablet; Take 1 tablet (25 mg total) by mouth 2 (two) times daily.  Dispense: 60 tablet; Refill: 5 - hydrALAZINE (APRESOLINE) 100 MG tablet; Take 1 tablet (100 mg total) by mouth 3 (three) times daily.  Dispense: 270 tablet; Refill: 1 - amLODipine (NORVASC) 10 MG tablet; Take 1 tablet (10 mg total) by mouth daily.  Dispense: 90 tablet; Refill: 1  3. Encounter  for long-term (current) use of insulin (HCC) She did not currently need RF  4. Gastroesophageal reflux disease without esophagitis - pantoprazole (PROTONIX) 20 MG tablet; Take 1 tablet (20 mg total) by mouth daily.  Dispense: 30 tablet; Refill: 3  5. Dyslipidemia - aspirin EC 81 MG tablet; Take 1 tablet (81 mg total) by mouth daily. Swallow whole. - rosuvastatin (CRESTOR) 10 MG tablet; Take 1 tablet (10 mg total) by mouth daily.  Dispense: 90 tablet; Refill: 3  6. Vitamin D deficiency - cholecalciferol (VITAMIN D3) 25 MCG (1000 UNIT) tablet; Take 1 tablet (1,000 Units total) by mouth in the morning and at bedtime.  Dispense: 30 tablet; Refill: 2  7. Edema, unspecified type - Elastic Bandages &  Supports (MEDICAL COMPRESSION STOCKINGS) MISC; Use 2 stockings daily.  Dispense: 2 each; Refill: 3  Will send message to LCSW Robyne Peers to check for home resources since patient can no longer participate in [facility]  Return in about 3 months (around 07/04/2023) for Dr Laural Benes to re-establish care.  The patient was given clear instructions to go to ER or return to medical center if symptoms don't improve, worsen or new problems develop. The patient verbalized understanding. The patient was told to call to get lab results if they haven't heard anything in the next week.      Georgian Co, PA-C [facility] and [facility] [place], [place] [phone]   04/05/2023, 9:12 AM

## 2023-04-09 DIAGNOSIS — M48062 Spinal stenosis, lumbar region with neurogenic claudication: Secondary | ICD-10-CM | POA: Diagnosis not present

## 2023-04-10 ENCOUNTER — Telehealth: Payer: Self-pay

## 2023-04-10 NOTE — Telephone Encounter (Signed)
I spoke to the patient's daughter, [name], about help at home. She said her mother is living with her but she needs to work and would appreciate any help at home she can get.  I explained about [facility] and she was in agreement to placing a referral.  Marylene Land- I will complete the referral for you to sign next week

## 2023-04-14 ENCOUNTER — Encounter: Payer: Self-pay | Admitting: Physical Therapy

## 2023-04-14 ENCOUNTER — Ambulatory Visit: Payer: 59 | Attending: Orthopaedic Surgery | Admitting: Physical Therapy

## 2023-04-14 VITALS — BP 160/87 | HR 89

## 2023-04-14 DIAGNOSIS — M5416 Radiculopathy, lumbar region: Secondary | ICD-10-CM | POA: Diagnosis not present

## 2023-04-14 DIAGNOSIS — M5459 Other low back pain: Secondary | ICD-10-CM | POA: Diagnosis not present

## 2023-04-14 DIAGNOSIS — M6281 Muscle weakness (generalized): Secondary | ICD-10-CM | POA: Insufficient documentation

## 2023-04-14 NOTE — Therapy (Signed)
OUTPATIENT PHYSICAL THERAPY THORACOLUMBAR TREATMENT   Patient Name: Julie Colon MRN: [id number] DOB:05-01-1934, 89 y.o., female Today's Date: 04/14/2023  END OF SESSION:  PT End of Session - 04/14/23 0851     Visit Number 2    Number of Visits 5    Date for PT Re-Evaluation 05/09/23    Authorization Type Medicare    PT Start Time 0848    Activity Tolerance Patient limited by pain    Behavior During Therapy [facility] for tasks assessed/performed             Past Medical History:  Diagnosis Date   Asthma    Constipation    Dementia (HCC)    Diabetes mellitus without complication (HCC)    GERD (gastroesophageal reflux disease)    Hypertension    Rectal prolapse    Past Surgical History:  Procedure Laterality Date   none     Patient Active Problem List   Diagnosis Date Noted   Dementia with behavioral disturbance (HCC) 10/01/2020   Constipation 10/01/2020   Gastroesophageal reflux disease without esophagitis 04/20/2020   Iron deficiency 04/20/2020   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   Essential hypertension 03/20/2020   Hyponatremia 03/02/2020   Sepsis due to pneumonia (HCC) 03/02/2020   Noncompliance 03/02/2020   Leukocytosis 03/02/2020   Advanced age 41/09/2019   Pneumonia 03/02/2020   Hyperglycemic hyperosmolar nonketotic coma (HCC) 01/14/2020   Type 2 diabetes mellitus with complication, without long-term current use of insulin (HCC) 01/14/2020   Hyperkalemia 01/14/2020   AKI (acute kidney injury) (HCC) 01/14/2020    PCP: Jonah Blue, MD  REFERRING PROVIDER: Marcene Corning, MD  REFERRING DIAG: M54.50 (ICD-10-CM) - Low back pain M48.00 (ICD-10-CM) - Spinal stenosis  Rationale for Evaluation and Treatment: Rehabilitation  THERAPY DIAG:  Muscle weakness (generalized)  Other low back pain  Radiculopathy, lumbar region  ONSET DATE: 03/18/2023 (referral date)  SUBJECTIVE:                                                                                                                                                                                            SUBJECTIVE STATEMENT: Pt reports her pain has been a little worse, but is better when she moves. Denies falls/near falls.   Accompanied by: Daughter-[name] who provides interpretation (refuse medical interpreter when therapist offers)   PERTINENT HISTORY:  past medical history significant for hypertension, diabetes mellitus, asthma, dementia, GERD and remote history of smoking  PAIN:  Are you having pain? Yes: NPRS scale: 8/10 Pain location: low back into thighs, has numbness in legs Pain description: achy, numbness into legs Aggravating factors: sitting for a  long period of time Relieving factors: Tylenol and Zoloft have helped a little bit  PRECAUTIONS: Fall  RED FLAGS: Bowel or bladder incontinence: Yes: very rare but has had some B/B incontinence    WEIGHT BEARING RESTRICTIONS: No  FALLS:  Has patient fallen in last 6 months? No  LIVING ENVIRONMENT: Lives with: lives with their family Lives in: House/apartment Pt stays on first floor, no issues with stairs  OCCUPATION: retired  PLOF: Independent with gait and Independent with transfers  PATIENT GOALS: "to make back better, decrease pain"  NEXT MD VISIT: supposed to get an injection in back from Dr. Jerl Santos, office to call and schedule  OBJECTIVE:   DIAGNOSTIC FINDINGS:  Lumbar Spine MRI 03/08/2023 Disc levels:   T12- L1: Unremarkable.   L1-L2: Disc narrowing and bulging with mild facet spurring.   L2-L3: Disc narrowing and endplate degeneration with circumferential disc bulging and endplate spurring. Mild bilateral foraminal narrowing. Mild to moderate spinal stenosis   L3-L4: Disc narrowing and circumferential bulging. Facet spurring and ligamentum flavum thickening. Mild-to-moderate spinal stenosis.   L4-L5: Disc narrowing and bulging with posterior annular fissure. Facet spurring and ligamentum  flavum thickening. Mild triangular narrowing of the thecal sac. The foramina are patent   L5-S1:Disc narrowing with endplate and facet spurring. The canal and foramina are patent.   IMPRESSION: 1. Marrow edema at the L5 superior endplate, likely reactive to chronic degeneration and remote superior endplate fracture when correlated with recent CT. 2. Generalized lumbar spine degeneration with retrolisthesis at L2-3. There is mild to moderate spinal stenosis at L2-3 and L3-4. 3. 7 mm endometrial thickness is notable for age, correlate for abnormal uterine bleeding.  PATIENT SURVEYS:  Modified Oswestry 48/50   SCREENING FOR RED FLAGS: Bowel or bladder incontinence: Yes: very rare but has had a recent episode of B/B incontinence in bed and pt was unaware Spinal tumors: No Cauda equina syndrome: No Compression fracture: No Abdominal aneurysm: No  COGNITION: Overall cognitive status: Difficulty to assess due to: language barrier      SENSATION: N/T in BLE (down posterior thighs)  POSTURE: rounded shoulders and forward head  PALPATION: TTP in paraspinals in lower back but pain travels all the way up spine Muscle tightness in paraspinals all along spine, lower back > mid and upper back  LUMBAR ROM:   AROM eval  Flexion WFL, very painful  Extension 50%, mildy painful  Right lateral flexion 25%  Left lateral flexion 25%  Right rotation [facility] but very painful  Left rotation WFL but very painful   (Blank rows = not tested)  LOWER EXTREMITY ROM:     Active  Right eval Left eval  Hip flexion Decreased due to back pain Decreased due to back pain  Hip extension    Hip abduction    Hip adduction    Hip internal rotation    Hip external rotation    Knee flexion    Knee extension [facility]  Ankle dorsiflexion    Ankle plantarflexion    Ankle inversion    Ankle eversion     (Blank rows = not tested)  LOWER EXTREMITY MMT:    MMT Right eval Left eval  Hip flexion 3 4   Hip extension    Hip abduction    Hip adduction    Hip internal rotation    Hip external rotation    Knee flexion 4 4  Knee extension 4 4  Ankle dorsiflexion 4 4  Ankle plantarflexion  Ankle inversion    Ankle eversion     (Blank rows = not tested)  LUMBAR SPECIAL TESTS:  Slump test: Positive Unable to lift up LE for SLR due to hip flex and quad weakness  GAIT: Distance walked: various clinic distances Assistive device utilized: Single point cane Level of assistance: CGA Comments: frequent reaching out for wall to steady herself, per dtr should be using a RW but only uses a SPC  TODAY'S TREATMENT:                                                                                                                               Vitals:   04/14/23 0856  BP: (!) 160/87  Pulse: 89   TherEx:  - Supine Transversus Abdominis Bracing - Hands on Stomach  - 3 sets - 10 reps - Supine March  - 3 sets - 10 reps - Supine Single Knee to Chest Stretch  - 3 sets - 30 seconds hold - Supine Lower Trunk Rotation - 2 sets - 15 reps - SciFit lvl 3 x 8.75 min (LE due to R shoulder pain)  *Required mod/max verbal/visual/tactile cues to complete particularly with TA activition  PATIENT EDUCATION:  Education details: Initial HEP Person educated: Patient and Child(ren) Education method: Medical illustrator Education comprehension: verbalized understanding, returned demonstration, and needs further education  HOME EXERCISE PROGRAM: Access Code: [email or web address] URL: [email or web address] Date: 04/14/2023 Prepared by: Maryruth Eve  Exercises - Supine Transversus Abdominis Bracing - Hands on Stomach  - 1 x daily - 7 x weekly - 3 sets - 10 reps - Supine March  - 1 x daily - 7 x weekly - 3 sets - 10 reps - Supine Single Knee to Chest Stretch  - 1 x daily - 7 x weekly - 3 sets - 30 seconds hold - Supine Lower Trunk Rotation  - 1 x daily - 7 x weekly - 2 sets - 15  reps ASSESSMENT:  CLINICAL IMPRESSION: Session emphasized work on Optometrist of intial HEP. Patient required max verbal and tactile cues to understand exercises. Patient had difficulty achieving positions but tolerated overall well with cueing. Pain still significant so will continue to address per POC.    OBJECTIVE IMPAIRMENTS: Abnormal gait, decreased activity tolerance, decreased balance, decreased knowledge of condition, decreased knowledge of use of DME, decreased mobility, difficulty walking, decreased ROM, decreased strength, increased fascial restrictions, impaired perceived functional ability, impaired sensation, improper body mechanics, postural dysfunction, and pain.   ACTIVITY LIMITATIONS: carrying, lifting, bending, sitting, standing, stairs, transfers, bed mobility, and continence  PARTICIPATION LIMITATIONS: meal prep, cleaning, laundry, driving, and community activity  PERSONAL FACTORS: Age, Time since onset of injury/illness/exacerbation, and 3+ comorbidities:    past medical history significant for hypertension, diabetes mellitus, asthma, dementia, GERD and remote history of smokingare also affecting patient's functional outcome.   REHAB POTENTIAL: Fair time since back injury  CLINICAL DECISION MAKING: Stable/uncomplicated  EVALUATION COMPLEXITY: Low   GOALS:  Goals reviewed with patient? Yes  SHORT TERM GOALS=LONG TERM GOALS: due to length of POC   LONG TERM GOALS: Target date: 05/06/2023  Pt will be independent with final HEP for improved strength, balance, transfers and gait and for increased independence with management of pain symptoms. Baseline:  Goal status: INITIAL  2.  Pt will improve her score on the Oswestry to </= 43/50 to demonstrate decreased disability level. Baseline: 48/50 Goal status: INITIAL  3.  Pt will be able to perform bed mobility with no more than Supervision assist Baseline: min A at times due to pain (7/26) Goal status:  INITIAL   PLAN:  PT FREQUENCY: 1x/week  PT DURATION: 4 weeks  PLANNED INTERVENTIONS: Therapeutic exercises, Therapeutic activity, Neuromuscular re-education, Balance training, Gait training, Patient/Family education, Self Care, Joint mobilization, DME instructions, Aquatic Therapy, Dry Needling, Electrical stimulation, Spinal manipulation, Spinal mobilization, Cryotherapy, Moist heat, Taping, Traction, Manual therapy, and Re-evaluation.  PLAN FOR NEXT SESSION: review + update HEP for core strengthening/stability: TA contract with supine exercises, sciatic nerve glides if tolerated, hip flexor and quad strengthening (SAQ) if pt can tolerate supine position, prone press-ups, childs pose, LTR  Maryruth Eve, PT, DPT 04/14/2023, 8:52 AM

## 2023-04-15 NOTE — Telephone Encounter (Signed)
Julie Colon,  I left the [facility] request for you to sign and it can be faxed to [place] [employer]

## 2023-04-21 ENCOUNTER — Ambulatory Visit: Payer: 59 | Admitting: Physical Therapy

## 2023-04-21 ENCOUNTER — Encounter: Payer: Self-pay | Admitting: Physical Therapy

## 2023-04-21 VITALS — BP 124/76 | HR 79

## 2023-04-21 DIAGNOSIS — M5459 Other low back pain: Secondary | ICD-10-CM | POA: Diagnosis not present

## 2023-04-21 DIAGNOSIS — M5416 Radiculopathy, lumbar region: Secondary | ICD-10-CM | POA: Diagnosis not present

## 2023-04-21 DIAGNOSIS — M6281 Muscle weakness (generalized): Secondary | ICD-10-CM | POA: Diagnosis not present

## 2023-04-21 NOTE — Therapy (Signed)
OUTPATIENT PHYSICAL THERAPY THORACOLUMBAR TREATMENT   Patient Name: Julie Colon MRN: [id number] DOB:03-06-1934, 89 y.o., female Today's Date: 04/21/2023  END OF SESSION:  PT End of Session - 04/21/23 0931     Visit Number 3    Number of Visits 5    Date for PT Re-Evaluation 05/09/23    Authorization Type Medicare    PT Start Time 0931    PT Stop Time 1012    PT Time Calculation (min) 41 min    Equipment Utilized During Treatment Gait belt    Activity Tolerance Patient limited by pain;Patient limited by lethargy    Behavior During Therapy [facility] for tasks assessed/performed             Past Medical History:  Diagnosis Date   Asthma    Constipation    Dementia (HCC)    Diabetes mellitus without complication (HCC)    GERD (gastroesophageal reflux disease)    Hypertension    Rectal prolapse    Past Surgical History:  Procedure Laterality Date   none     Patient Active Problem List   Diagnosis Date Noted   Dementia with behavioral disturbance (HCC) 10/01/2020   Constipation 10/01/2020   Gastroesophageal reflux disease without esophagitis 04/20/2020   Iron deficiency 04/20/2020   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   Essential hypertension 03/20/2020   Hyponatremia 03/02/2020   Sepsis due to pneumonia (HCC) 03/02/2020   Noncompliance 03/02/2020   Leukocytosis 03/02/2020   Advanced age 19/09/2019   Pneumonia 03/02/2020   Hyperglycemic hyperosmolar nonketotic coma (HCC) 01/14/2020   Type 2 diabetes mellitus with complication, without long-term current use of insulin (HCC) 01/14/2020   Hyperkalemia 01/14/2020   AKI (acute kidney injury) (HCC) 01/14/2020    PCP: Jonah Blue, MD  REFERRING PROVIDER: Marcene Corning, MD  REFERRING DIAG: M54.50 (ICD-10-CM) - Low back pain M48.00 (ICD-10-CM) - Spinal stenosis  Rationale for Evaluation and Treatment: Rehabilitation  THERAPY DIAG:  Muscle weakness (generalized)  Other low back pain  Radiculopathy, lumbar  region  ONSET DATE: 03/18/2023 (referral date)  SUBJECTIVE:                                                                                                                                                                                           SUBJECTIVE STATEMENT: Pt reports that her pain has been about the same; patient and daughter report that patient has been working on her exercises at home. Patient's daughter just got patient a foot peddler but patient has not started using it yet. Denies falls/near falls.   Accompanied by: Daughter-[name] who provides interpretation (refuse medical interpreter when therapist offers)  PERTINENT HISTORY:  past medical history significant for hypertension, diabetes mellitus, asthma, dementia, GERD and remote history of smoking  PAIN:  Are you having pain? Yes: NPRS scale: 8/10 Pain location: low back into thighs, has numbness in legs Pain description: achy, numbness into legs Aggravating factors: sitting for a long period of time Relieving factors: Tylenol and Zoloft have helped a little bit  PRECAUTIONS: Fall  RED FLAGS: Bowel or bladder incontinence: Yes: very rare but has had some B/B incontinence    WEIGHT BEARING RESTRICTIONS: No  FALLS:  Has patient fallen in last 6 months? No  LIVING ENVIRONMENT: Lives with: lives with their family Lives in: House/apartment Pt stays on first floor, no issues with stairs  OCCUPATION: retired  PLOF: Independent with gait and Independent with transfers  PATIENT GOALS: "to make back better, decrease pain"  NEXT MD VISIT: supposed to get an injection in back from Dr. Jerl Santos, office to call and schedule  OBJECTIVE:   DIAGNOSTIC FINDINGS:  Lumbar Spine MRI 03/08/2023 Disc levels:   T12- L1: Unremarkable.   L1-L2: Disc narrowing and bulging with mild facet spurring.   L2-L3: Disc narrowing and endplate degeneration with circumferential disc bulging and endplate spurring. Mild bilateral  foraminal narrowing. Mild to moderate spinal stenosis   L3-L4: Disc narrowing and circumferential bulging. Facet spurring and ligamentum flavum thickening. Mild-to-moderate spinal stenosis.   L4-L5: Disc narrowing and bulging with posterior annular fissure. Facet spurring and ligamentum flavum thickening. Mild triangular narrowing of the thecal sac. The foramina are patent   L5-S1:Disc narrowing with endplate and facet spurring. The canal and foramina are patent.   IMPRESSION: 1. Marrow edema at the L5 superior endplate, likely reactive to chronic degeneration and remote superior endplate fracture when correlated with recent CT. 2. Generalized lumbar spine degeneration with retrolisthesis at L2-3. There is mild to moderate spinal stenosis at L2-3 and L3-4. 3. 7 mm endometrial thickness is notable for age, correlate for abnormal uterine bleeding.  PATIENT SURVEYS:  Modified Oswestry 48/50   SCREENING FOR RED FLAGS: Bowel or bladder incontinence: Yes: very rare but has had a recent episode of B/B incontinence in bed and pt was unaware Spinal tumors: No Cauda equina syndrome: No Compression fracture: No Abdominal aneurysm: No  COGNITION: Overall cognitive status: Difficulty to assess due to: language barrier      SENSATION: N/T in BLE (down posterior thighs)  POSTURE: rounded shoulders and forward head  PALPATION: TTP in paraspinals in lower back but pain travels all the way up spine Muscle tightness in paraspinals all along spine, lower back > mid and upper back  LUMBAR ROM:   AROM eval  Flexion WFL, very painful  Extension 50%, mildy painful  Right lateral flexion 25%  Left lateral flexion 25%  Right rotation [facility] but very painful  Left rotation WFL but very painful   (Blank rows = not tested)  LOWER EXTREMITY ROM:     Active  Right eval Left eval  Hip flexion Decreased due to back pain Decreased due to back pain  Hip extension    Hip abduction    Hip  adduction    Hip internal rotation    Hip external rotation    Knee flexion    Knee extension [facility]  Ankle dorsiflexion    Ankle plantarflexion    Ankle inversion    Ankle eversion     (Blank rows = not tested)  LOWER EXTREMITY MMT:    MMT Right eval Left eval  Hip  flexion 3 4  Hip extension    Hip abduction    Hip adduction    Hip internal rotation    Hip external rotation    Knee flexion 4 4  Knee extension 4 4  Ankle dorsiflexion 4 4  Ankle plantarflexion    Ankle inversion    Ankle eversion     (Blank rows = not tested)  LUMBAR SPECIAL TESTS:  Slump test: Positive Unable to lift up LE for SLR due to hip flex and quad weakness  GAIT: Distance walked: various clinic distances Assistive device utilized: Single point cane Level of assistance: CGA Comments: frequent reaching out for wall to steady herself, per dtr should be using a RW but only uses a SPC  TODAY'S TREATMENT:                                                                                                                               Vitals:   04/21/23 0938 04/21/23 0952  BP: 116/66 124/76  Pulse: 95 79    TherEx:  - SciFit lvl 4 x 8 min (LE due to R shoulder pain - reports reduction in pain)  - Standing march at counter 2 x 10  - Standing hip abduction at counter 2 x 10 - Supine Transversus Abdominis Bracing - Hands on Stomach  - 1 sets - 10 reps (very difficult time sequencing despite max verbal/tactile cues) - Supine March  - 2 sets - 10 reps - Supine Lower Trunk Rotation - 2 sets - 10 reps  *Required mod/max verbal/visual/tactile cues to complete particularly with TA activition  PATIENT EDUCATION:  Education details: Continue HEP + updates Person educated: Patient and Child(ren) Education method: Medical illustrator Education comprehension: verbalized understanding, returned demonstration, and needs further education  HOME EXERCISE PROGRAM: Access Code: [email or web address] URL:  [email or web address] Date: 04/21/2023 Prepared by: Maryruth Eve  Exercises - Supine Transversus Abdominis Bracing - Hands on Stomach  - 1 x daily - 7 x weekly - 3 sets - 10 reps - Supine March  - 1 x daily - 7 x weekly - 3 sets - 10 reps - Supine Single Knee to Chest Stretch  - 1 x daily - 7 x weekly - 3 sets - 30 seconds hold - Supine Lower Trunk Rotation  - 1 x daily - 7 x weekly - 2 sets - 10 reps - Standing March with Counter Support  - 1 x daily - 7 x weekly - 2 sets - 10 reps - Standing Hip Abduction with Counter Support  - 1 x daily - 7 x weekly - 2 sets - 10 reps  ASSESSMENT:  CLINICAL IMPRESSION: Session emphasized work review and progression of intial HEP. Patient's daughter reports baseline memory changes for patient which strongly impact treatment. Patient demonstrates limited progress thus far and has minimal tolerance for exercise throughout session. Patient requires frequent redirection and demonstrates difficultly coordinating even simple exercise. Continue POC as able.  OBJECTIVE IMPAIRMENTS: Abnormal gait, decreased activity tolerance, decreased balance, decreased knowledge of condition, decreased knowledge of use of DME, decreased mobility, difficulty walking, decreased ROM, decreased strength, increased fascial restrictions, impaired perceived functional ability, impaired sensation, improper body mechanics, postural dysfunction, and pain.   ACTIVITY LIMITATIONS: carrying, lifting, bending, sitting, standing, stairs, transfers, bed mobility, and continence  PARTICIPATION LIMITATIONS: meal prep, cleaning, laundry, driving, and community activity  PERSONAL FACTORS: Age, Time since onset of injury/illness/exacerbation, and 3+ comorbidities:    past medical history significant for hypertension, diabetes mellitus, asthma, dementia, GERD and remote history of smokingare also affecting patient's functional outcome.   REHAB POTENTIAL: Fair time since back  injury  CLINICAL DECISION MAKING: Stable/uncomplicated  EVALUATION COMPLEXITY: Low   GOALS: Goals reviewed with patient? Yes  SHORT TERM GOALS=LONG TERM GOALS: due to length of POC   LONG TERM GOALS: Target date: 05/06/2023  Pt will be independent with final HEP for improved strength, balance, transfers and gait and for increased independence with management of pain symptoms. Baseline:  Goal status: INITIAL  2.  Pt will improve her score on the Oswestry to </= 43/50 to demonstrate decreased disability level. Baseline: 48/50 Goal status: INITIAL  3.  Pt will be able to perform bed mobility with no more than Supervision assist Baseline: min A at times due to pain (7/26) Goal status: INITIAL   PLAN:  PT FREQUENCY: 1x/week  PT DURATION: 4 weeks  PLANNED INTERVENTIONS: Therapeutic exercises, Therapeutic activity, Neuromuscular re-education, Balance training, Gait training, Patient/Family education, Self Care, Joint mobilization, DME instructions, Aquatic Therapy, Dry Needling, Electrical stimulation, Spinal manipulation, Spinal mobilization, Cryotherapy, Moist heat, Taping, Traction, Manual therapy, and Re-evaluation.  PLAN FOR NEXT SESSION: review + update HEP for core strengthening/stability: TA contract with supine exercises, sciatic nerve glides if tolerated, hip flexor and quad strengthening (SAQ) if pt can tolerate supine position, prone press-ups, childs pose, LTR  Maryruth Eve, PT, DPT 04/21/2023, 10:52 AM

## 2023-04-28 ENCOUNTER — Emergency Department (HOSPITAL_COMMUNITY): Payer: 59

## 2023-04-28 ENCOUNTER — Ambulatory Visit: Payer: 59 | Admitting: Physical Therapy

## 2023-04-28 ENCOUNTER — Emergency Department (HOSPITAL_COMMUNITY)
Admission: EM | Admit: 2023-04-28 | Discharge: 2023-04-28 | Disposition: A | Discharge status: Home / Self Care | Payer: 59 | Attending: Emergency Medicine | Admitting: Emergency Medicine

## 2023-04-28 ENCOUNTER — Encounter (HOSPITAL_COMMUNITY): Payer: Self-pay | Admitting: Emergency Medicine

## 2023-04-28 VITALS — BP 159/86 | HR 114

## 2023-04-28 DIAGNOSIS — Z1152 Encounter for screening for COVID-19: Secondary | ICD-10-CM | POA: Insufficient documentation

## 2023-04-28 DIAGNOSIS — R0789 Other chest pain: Secondary | ICD-10-CM | POA: Diagnosis not present

## 2023-04-28 DIAGNOSIS — R079 Chest pain, unspecified: Secondary | ICD-10-CM | POA: Diagnosis not present

## 2023-04-28 DIAGNOSIS — Z79899 Other long term (current) drug therapy: Secondary | ICD-10-CM | POA: Insufficient documentation

## 2023-04-28 DIAGNOSIS — J181 Lobar pneumonia, unspecified organism: Secondary | ICD-10-CM | POA: Diagnosis not present

## 2023-04-28 DIAGNOSIS — F0390 Unspecified dementia without behavioral disturbance: Secondary | ICD-10-CM | POA: Diagnosis not present

## 2023-04-28 DIAGNOSIS — Z794 Long term (current) use of insulin: Secondary | ICD-10-CM | POA: Insufficient documentation

## 2023-04-28 DIAGNOSIS — M5416 Radiculopathy, lumbar region: Secondary | ICD-10-CM

## 2023-04-28 DIAGNOSIS — M5459 Other low back pain: Secondary | ICD-10-CM

## 2023-04-28 DIAGNOSIS — J189 Pneumonia, unspecified organism: Secondary | ICD-10-CM

## 2023-04-28 DIAGNOSIS — I10 Essential (primary) hypertension: Secondary | ICD-10-CM | POA: Diagnosis not present

## 2023-04-28 DIAGNOSIS — Z7982 Long term (current) use of aspirin: Secondary | ICD-10-CM | POA: Insufficient documentation

## 2023-04-28 DIAGNOSIS — I517 Cardiomegaly: Secondary | ICD-10-CM | POA: Diagnosis not present

## 2023-04-28 DIAGNOSIS — R918 Other nonspecific abnormal finding of lung field: Secondary | ICD-10-CM | POA: Diagnosis not present

## 2023-04-28 DIAGNOSIS — Z7984 Long term (current) use of oral hypoglycemic drugs: Secondary | ICD-10-CM | POA: Insufficient documentation

## 2023-04-28 DIAGNOSIS — J984 Other disorders of lung: Secondary | ICD-10-CM | POA: Diagnosis not present

## 2023-04-28 DIAGNOSIS — M6281 Muscle weakness (generalized): Secondary | ICD-10-CM

## 2023-04-28 LAB — BASIC METABOLIC PANEL
Anion gap: 13 (ref 5–15)
BUN: 18 mg/dL (ref 8–23)
CO2: 29 mmol/L (ref 22–32)
Calcium: 9.7 mg/dL (ref 8.9–10.3)
Chloride: 97 mmol/L — ABNORMAL LOW (ref 98–111)
Creatinine, Ser: 0.8 mg/dL (ref 0.44–1.00)
GFR, Estimated: >60 mL/min (ref >60)
Glucose, Bld: 224 mg/dL — ABNORMAL HIGH (ref 70–99)
Potassium: 5.2 mmol/L — ABNORMAL HIGH (ref 3.5–5.1)
Sodium: 139 mmol/L (ref 135–145)

## 2023-04-28 LAB — RESP PANEL BY RT-PCR (RSV, FLU A&B, COVID)  RVPGX2
Influenza A by PCR: NEGATIVE
Influenza B by PCR: NEGATIVE
Resp Syncytial Virus by PCR: NEGATIVE
SARS Coronavirus 2 by RT PCR: NEGATIVE

## 2023-04-28 LAB — CBC
HCT: 37.9 % (ref 36.0–46.0)
Hemoglobin: 11.3 g/dL — ABNORMAL LOW (ref 12.0–15.0)
MCH: 29.4 pg (ref 26.0–34.0)
MCHC: 29.8 g/dL — ABNORMAL LOW (ref 30.0–36.0)
MCV: 98.4 fL (ref 80.0–100.0)
Platelets: 159 10*3/uL (ref 150–400)
RBC: 3.85 MIL/uL — ABNORMAL LOW (ref 3.87–5.11)
RDW: 14.1 % (ref 11.5–15.5)
WBC: 6.4 10*3/uL (ref 4.0–10.5)
nRBC: 0 % (ref 0.0–0.2)

## 2023-04-28 LAB — TROPONIN I (HIGH SENSITIVITY)
Troponin I (High Sensitivity): 6 ng/L (ref <18)
Troponin I (High Sensitivity): 6 ng/L (ref <18)

## 2023-04-28 MED ORDER — DOXYCYCLINE HYCLATE 100 MG PO CAPS: 100.0000 mg | ORAL_CAPSULE | Freq: Two times a day (BID) | ORAL | 0 refills | Status: DC

## 2023-04-28 MED ORDER — IBUPROFEN 800 MG PO TABS
800.0000 mg | ORAL_TABLET | Freq: Once | ORAL | Status: AC
  Administered 2023-04-28: 800 mg via ORAL
  Filled 2023-04-28: qty 1

## 2023-04-28 MED ORDER — SODIUM CHLORIDE (PF) 0.9 % IJ SOLN
INTRAMUSCULAR | Status: AC
  Filled 2023-04-28: qty 50

## 2023-04-28 MED ORDER — IOHEXOL 300 MG/ML  SOLN
75.0000 mL | Freq: Once | INTRAMUSCULAR | Status: AC | PRN
  Administered 2023-04-28: 60 mL via INTRAVENOUS

## 2023-04-28 NOTE — Discharge Instructions (Signed)
Take Tylenol or Motrin for pain.  Follow-up with your family doctor next week for recheck

## 2023-04-28 NOTE — ED Provider Notes (Signed)
[place] EMERGENCY DEPARTMENT AT [facility] Provider Note   CSN: [id number] Arrival date & time: 04/28/23  1032     History {Add pertinent medical, surgical, social history, OB history to HPI:1} Chief Complaint  Patient presents with   Chest Pain    Julie Colon is a 89 y.o. female.  Patient has a history of dementia and hypertension.  She has been having a cough and some chest discomfort   Chest Pain      Home Medications Prior to Admission medications   Medication Sig Start Date End Date Taking? Authorizing Provider  doxycycline (VIBRAMYCIN) 100 MG capsule Take 1 capsule (100 mg total) by mouth 2 (two) times daily. One po bid x 7 days 04/28/23  Yes Bethann Berkshire, MD  Accu-Chek FastClix Lancets MISC UAD 04/20/20   Marcine Matar, MD  acetaminophen (TYLENOL) 325 MG tablet Take 650 mg by mouth every 6 (six) hours as needed.    [provider]  albuterol (VENTOLIN HFA) 108 (90 Base) MCG/ACT inhaler Inhale into the lungs every 6 (six) hours as needed for wheezing or shortness of breath.    [provider]  amLODipine (NORVASC) 10 MG tablet Take 1 tablet (10 mg total) by mouth daily. 04/03/23   Anders Simmonds, PA-C  aspirin EC 81 MG tablet Take 1 tablet (81 mg total) by mouth daily. Swallow whole. 04/03/23   Anders Simmonds, PA-C  bisacodyl (FLEET) 10 MG/30ML ENEM Place 10 mg rectally once. As needed for severe constipation.    [provider]  Blood Glucose Monitoring Suppl (ACCU-CHEK GUIDE ME) w/Device KIT UAD 04/20/20   Marcine Matar, MD  cholecalciferol (VITAMIN D3) 25 MCG (1000 UNIT) tablet Take 1 tablet (1,000 Units total) by mouth in the morning and at bedtime. 04/03/23   Anders Simmonds, PA-C  cyclobenzaprine (FLEXERIL) 10 MG tablet Take 0.5 tablets (5 mg total) by mouth 2 (two) times daily as needed for muscle spasms. 03/08/23   Rondel Baton, MD  Elastic Bandages & Supports (MEDICAL COMPRESSION STOCKINGS) MISC Use 2  stockings daily. 04/03/23   Anders Simmonds, PA-C  ferrous sulfate 325 (65 FE) MG tablet TAKE 1 TABLET (325 MG TOTAL) BY MOUTH DAILY WITH BREAKFAST. 09/29/20 09/29/21  Marcine Matar, MD  glucose blood (ACCU-CHEK GUIDE) test strip Check BS once a day 04/20/20   Marcine Matar, MD  hydrALAZINE (APRESOLINE) 100 MG tablet Take 1 tablet (100 mg total) by mouth 3 (three) times daily. 04/03/23   Anders Simmonds, PA-C  insulin glargine (LANTUS SOLOSTAR) 100 UNIT/ML Solostar Pen Inject 20 Units into the skin daily. 04/03/23   Anders Simmonds, PA-C  metFORMIN (GLUCOPHAGE) 500 MG tablet Take 1 tablet (500 mg total) by mouth 2 (two) times daily with a meal. 04/03/23   McClung, Marzella Schlein, PA-C  metoprolol tartrate (LOPRESSOR) 25 MG tablet Take 1 tablet (25 mg total) by mouth 2 (two) times daily. 04/03/23   Anders Simmonds, PA-C  pantoprazole (PROTONIX) 20 MG tablet Take 1 tablet (20 mg total) by mouth daily. 04/03/23   Anders Simmonds, PA-C  PEG-KCl-NaCl-NaSulf-Na Asc-C (PLENVU) 140 g SOLR Take 140 g by mouth as directed. 01/03/21   Meredith Pel, NP  rosuvastatin (CRESTOR) 10 MG tablet Take 1 tablet (10 mg total) by mouth daily. 04/03/23   Anders Simmonds, PA-C      Allergies    Patient has no known allergies.    Review of Systems  Review of Systems  Cardiovascular:  Positive for chest pain.    Physical Exam Updated Vital Signs BP (!) 156/75 (BP Location: Right Arm)   Pulse 89   Temp 98.2 F (36.8 C) (Oral)   Resp 15   SpO2 98%  Physical Exam  ED Results / Procedures / Treatments   Labs (all labs ordered are listed, but only abnormal results are displayed) Labs Reviewed  BASIC METABOLIC PANEL - Abnormal; Notable for the following components:      Result Value   Potassium 5.2 (*)    Chloride 97 (*)    Glucose, Bld 224 (*)    All other components within normal limits  CBC - Abnormal; Notable for the following components:   RBC 3.85 (*)    Hemoglobin 11.3 (*)    MCHC 29.8 (*)     All other components within normal limits  RESP PANEL BY RT-PCR (RSV, FLU A&B, COVID)  RVPGX2  TROPONIN I (HIGH SENSITIVITY)  TROPONIN I (HIGH SENSITIVITY)    EKG EKG Interpretation Date/Time:  Monday April 28 2023 10:48:01 EDT Ventricular Rate:  97 PR Interval:  190 QRS Duration:  78 QT Interval:  345 QTC Calculation: 439 R Axis:   49  Text Interpretation: Sinus rhythm Biatrial enlargement Confirmed by Bethann Berkshire [phone]) on 04/28/2023 4:26:34 PM  Radiology CT Chest W Contrast  Result Date: 04/28/2023 CLINICAL DATA:  Left suprahilar nodule on previous chest x-ray, chest pain EXAM: CT CHEST WITH CONTRAST TECHNIQUE: Multidetector CT imaging of the chest was performed during intravenous contrast administration. RADIATION DOSE REDUCTION: This exam was performed according to the departmental dose-optimization program which includes automated exposure control, adjustment of the mA and/or kV according to patient size and/or use of iterative reconstruction technique. CONTRAST:  60mL OMNIPAQUE IOHEXOL 300 MG/ML  SOLN COMPARISON:  04/28/2023, 04/09/2022 FINDINGS: Cardiovascular: Heart is unremarkable without pericardial effusion. No evidence of thoracic aortic aneurysm or dissection. Atherosclerosis of the aorta and coronary vasculature. Mediastinum/Nodes: No enlarged mediastinal, hilar, or axillary lymph nodes. Thyroid gland, trachea, and esophagus demonstrate no significant findings. Lungs/Pleura: The peripheral right middle lobe consolidation seen on previous exams is unchanged, most compatible with postinflammatory scarring. There is patchy nodular consolidation within the left lower lobe, reference image 85/4, which could reflect localized inflammatory/infectious change versus developing atelectasis. No other focal consolidation, effusion, or pneumothorax. There is no parenchymal lung abnormality in the left suprahilar region to correspond with the chest x-ray finding. This likely reflects  superimposed vascular shadow on prior imaging. Upper Abdomen: Calcified gallstones incidentally noted. No other acute upper abdominal finding. Musculoskeletal: No acute or destructive bony abnormalities. Prominent degenerative changes of the bilateral shoulders. Prominent spondylosis within the visualized upper lumbar spine, stable. Reconstructed images demonstrate no additional findings. IMPRESSION: 1. Patchy nodular consolidation within the left lower lobe, which may reflect developing atelectasis versus focal inflammatory/infectious change. 2. No CT finding in the left suprahilar region to correspond to the density seen on chest x-ray. X-ray finding likely reflects superimposed vascular shadows. 3. Chronic peripheral consolidation and associated volume loss within the right middle lobe, consistent with chronic postinflammatory scarring. 4. Aortic Atherosclerosis (ICD10-I70.0). Coronary artery atherosclerosis. 5. Incidental cholelithiasis.  No evidence of cholecystitis. Electronically Signed   By: Sharlet Salina M.D.   On: 04/28/2023 15:10   DG Chest 2 View  Result Date: 04/28/2023 CLINICAL DATA:  Chest pain EXAM: CHEST - 2 VIEW COMPARISON:  Chest radiograph 06/12/2022 FINDINGS: The heart is borderline enlarged. The upper mediastinal contours are normal. There  is nodular opacity in the left suprahilar region which is new since the study from 06/12/2022. There is patchy airspace opacity in the right lower lobe which appears similar to the prior study. There is no other focal airspace opacity. There is no pulmonary edema. There is no pleural effusion or pneumothorax There is no acute osseous abnormality. IMPRESSION: 1. New nodular opacity in the left suprahilar region. Recommend CT chest for characterization. This may be obtained on a nonemergent outpatient basis as indicated. 2. Patchy opacities in the right lower lobe are similar to the prior study and may reflect recurrent infection or chronic changes. This  may also be better assessed at CT. Electronically Signed   By: Lesia Hausen M.D.   On: 04/28/2023 11:26    Procedures Procedures  {Document cardiac monitor, telemetry assessment procedure when appropriate:1}  Medications Ordered in ED Medications  ibuprofen (ADVIL) tablet 800 mg (800 mg Oral Given 04/28/23 1233)  iohexol (OMNIPAQUE) 300 MG/ML solution 75 mL (60 mLs Intravenous Contrast Given 04/28/23 1402)    ED Course/ Medical Decision Making/ A&P   {   Click here for ABCD2, HEART and other calculatorsREFRESH Note before signing :1}                              Medical Decision Making Amount and/or Complexity of Data Reviewed Labs: ordered. Radiology: ordered.  Risk Prescription drug management.   Patient with community-acquired pneumonia.  She is placed on doxycycline and will follow-up with PCP  {Document critical care time when appropriate:1} {Document review of labs and clinical decision tools ie heart score, Chads2Vasc2 etc:1}  {Document your independent review of radiology images, and any outside records:1} {Document your discussion with family members, caretakers, and with consultants:1} {Document social determinants of health affecting pt's care:1} {Document your decision making why or why not admission, treatments were needed:1} Final Clinical Impression(s) / ED Diagnoses Final diagnoses:  Community acquired pneumonia of left lower lobe of lung    Rx / DC Orders ED Discharge Orders          Ordered    doxycycline (VIBRAMYCIN) 100 MG capsule  2 times daily        04/28/23 1629

## 2023-04-28 NOTE — ED Triage Notes (Signed)
Pt here from home with c/o chest pain and sob while doing PT for her back

## 2023-04-28 NOTE — Therapy (Signed)
OUTPATIENT PHYSICAL THERAPY THORACOLUMBAR TREATMENT   Patient Name: Julie Colon MRN: [id number] DOB:1934/03/21, 89 y.o., female Today's Date: 04/28/2023  END OF SESSION:  PT End of Session - 04/28/23 0903     Visit Number 4    Number of Visits 5    Date for PT Re-Evaluation 05/09/23    Authorization Type Medicare    PT Start Time 0900   pt arrived late, grandson took her to wrong office   PT Stop Time 0930    PT Time Calculation (min) 30 min    Equipment Utilized During Treatment Gait belt    Activity Tolerance Treatment limited secondary to medical complications (Comment)   tachycardic   Behavior During Therapy WFL for tasks assessed/performed              Past Medical History:  Diagnosis Date   Asthma    Constipation    Dementia (HCC)    Diabetes mellitus without complication (HCC)    GERD (gastroesophageal reflux disease)    Hypertension    Rectal prolapse    Past Surgical History:  Procedure Laterality Date   none     Patient Active Problem List   Diagnosis Date Noted   Dementia with behavioral disturbance (HCC) 10/01/2020   Constipation 10/01/2020   Gastroesophageal reflux disease without esophagitis 04/20/2020   Iron deficiency 04/20/2020   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   Essential hypertension 03/20/2020   Hyponatremia 03/02/2020   Sepsis due to pneumonia (HCC) 03/02/2020   Noncompliance 03/02/2020   Leukocytosis 03/02/2020   Advanced age 03/02/2020   Pneumonia 03/02/2020   Hyperglycemic hyperosmolar nonketotic coma (HCC) 01/14/2020   Type 2 diabetes mellitus with complication, without long-term current use of insulin (HCC) 01/14/2020   Hyperkalemia 01/14/2020   AKI (acute kidney injury) (HCC) 01/14/2020    PCP: Jonah Blue, MD  REFERRING PROVIDER: Marcene Corning, MD  REFERRING DIAG: M54.50 (ICD-10-CM) - Low back pain M48.00 (ICD-10-CM) - Spinal stenosis  Rationale for Evaluation and Treatment: Rehabilitation  THERAPY DIAG:   Muscle weakness (generalized)  Other low back pain  Radiculopathy, lumbar region  ONSET DATE: 03/18/2023 (referral date)  SUBJECTIVE:                                                                                                                                                                                           SUBJECTIVE STATEMENT: ***pt reports her pain is improved, doing her exercises. Feels like her heart is beating very fast started with this feeling on saturday  Accompanied by: Daughter-[name] who provides interpretation (refuse medical interpreter when therapist offers)  Use of [employer] video interpreter: Nicolette Bang [phone]  PERTINENT HISTORY:  past medical history significant for hypertension, diabetes mellitus, asthma, dementia, GERD and remote history of smoking  PAIN:  Are you having pain? Yes: NPRS scale: 8/10 Pain location: low back into thighs, has numbness in legs Pain description: achy, numbness into legs Aggravating factors: sitting for a long period of time Relieving factors: Tylenol and Zoloft have helped a little bit  PRECAUTIONS: Fall  RED FLAGS: Bowel or bladder incontinence: Yes: very rare but has had some B/B incontinence    WEIGHT BEARING RESTRICTIONS: No  FALLS:  Has patient fallen in last 6 months? No  LIVING ENVIRONMENT: Lives with: lives with their family Lives in: House/apartment Pt stays on first floor, no issues with stairs  OCCUPATION: retired  PLOF: Independent with gait and Independent with transfers  PATIENT GOALS: "to make back better, decrease pain"  NEXT MD VISIT: supposed to get an injection in back from Dr. Jerl Santos, office to call and schedule  OBJECTIVE:   DIAGNOSTIC FINDINGS:  Lumbar Spine MRI 03/08/2023 Disc levels:   T12- L1: Unremarkable.   L1-L2: Disc narrowing and bulging with mild facet spurring.   L2-L3: Disc narrowing and endplate degeneration with circumferential disc bulging and endplate spurring. Mild  bilateral foraminal narrowing. Mild to moderate spinal stenosis   L3-L4: Disc narrowing and circumferential bulging. Facet spurring and ligamentum flavum thickening. Mild-to-moderate spinal stenosis.   L4-L5: Disc narrowing and bulging with posterior annular fissure. Facet spurring and ligamentum flavum thickening. Mild triangular narrowing of the thecal sac. The foramina are patent   L5-S1:Disc narrowing with endplate and facet spurring. The canal and foramina are patent.   IMPRESSION: 1. Marrow edema at the L5 superior endplate, likely reactive to chronic degeneration and remote superior endplate fracture when correlated with recent CT. 2. Generalized lumbar spine degeneration with retrolisthesis at L2-3. There is mild to moderate spinal stenosis at L2-3 and L3-4. 3. 7 mm endometrial thickness is notable for age, correlate for abnormal uterine bleeding.  PATIENT SURVEYS:  Modified Oswestry 48/50   SCREENING FOR RED FLAGS: Bowel or bladder incontinence: Yes: very rare but has had a recent episode of B/B incontinence in bed and pt was unaware Spinal tumors: No Cauda equina syndrome: No Compression fracture: No Abdominal aneurysm: No  COGNITION: Overall cognitive status: Difficulty to assess due to: language barrier      SENSATION: N/T in BLE (down posterior thighs)  POSTURE: rounded shoulders and forward head  PALPATION: TTP in paraspinals in lower back but pain travels all the way up spine Muscle tightness in paraspinals all along spine, lower back > mid and upper back  LUMBAR ROM:   AROM eval  Flexion WFL, very painful  Extension 50%, mildy painful  Right lateral flexion 25%  Left lateral flexion 25%  Right rotation [facility] but very painful  Left rotation WFL but very painful   (Blank rows = not tested)  LOWER EXTREMITY ROM:     Active  Right eval Left eval  Hip flexion Decreased due to back pain Decreased due to back pain  Hip extension    Hip abduction     Hip adduction    Hip internal rotation    Hip external rotation    Knee flexion    Knee extension [facility]  Ankle dorsiflexion    Ankle plantarflexion    Ankle inversion    Ankle eversion     (Blank rows = not tested)  LOWER EXTREMITY MMT:    MMT Right eval Left eval  Hip  flexion 3 4  Hip extension    Hip abduction    Hip adduction    Hip internal rotation    Hip external rotation    Knee flexion 4 4  Knee extension 4 4  Ankle dorsiflexion 4 4  Ankle plantarflexion    Ankle inversion    Ankle eversion     (Blank rows = not tested)  LUMBAR SPECIAL TESTS:  Slump test: Positive Unable to lift up LE for SLR due to hip flex and quad weakness  GAIT: Distance walked: various clinic distances Assistive device utilized: Single point cane Level of assistance: CGA Comments: frequent reaching out for wall to steady herself, per dtr should be using a RW but only uses a SPC  TODAY'S TREATMENT:                                                                                                                               Vitals:   04/28/23 0908  BP: (!) 159/86  Pulse: (!) 114     TherEx:  *** Supine heel slides x 10 reps B, pain? Heart rate?  PATIENT EDUCATION:  Education details: Continue HEP + updates*** Person educated: Patient and Child(ren) Education method: Medical illustrator Education comprehension: verbalized understanding, returned demonstration, and needs further education  HOME EXERCISE PROGRAM: Access Code: [email or web address] URL: [email or web address] Date: 04/21/2023 Prepared by: Maryruth Eve  Exercises - Supine Transversus Abdominis Bracing - Hands on Stomach  - 1 x daily - 7 x weekly - 3 sets - 10 reps - Supine March  - 1 x daily - 7 x weekly - 3 sets - 10 reps - Supine Single Knee to Chest Stretch  - 1 x daily - 7 x weekly - 3 sets - 30 seconds hold - Supine Lower Trunk Rotation  - 1 x daily - 7 x weekly - 2 sets - 10 reps -  Standing March with Counter Support  - 1 x daily - 7 x weekly - 2 sets - 10 reps - Standing Hip Abduction with Counter Support  - 1 x daily - 7 x weekly - 2 sets - 10 reps  ASSESSMENT:  CLINICAL IMPRESSION: Session emphasized work review and progression of intial HEP. Patient's daughter reports baseline memory changes for patient which strongly impact treatment. Patient demonstrates limited progress thus far and has minimal tolerance for exercise throughout session. Patient requires frequent redirection and demonstrates difficultly coordinating even simple exercise. Continue POC as able.    Session limited by patient's late arrival as well as elevated resting HR this date. Emphasis of skilled PT session*** Continue POC.    OBJECTIVE IMPAIRMENTS: Abnormal gait, decreased activity tolerance, decreased balance, decreased knowledge of condition, decreased knowledge of use of DME, decreased mobility, difficulty walking, decreased ROM, decreased strength, increased fascial restrictions, impaired perceived functional ability, impaired sensation, improper body mechanics, postural dysfunction, and pain.   ACTIVITY LIMITATIONS: carrying, lifting, bending, sitting, standing, stairs, transfers, bed mobility,  and continence  PARTICIPATION LIMITATIONS: meal prep, cleaning, laundry, driving, and community activity  PERSONAL FACTORS: Age, Time since onset of injury/illness/exacerbation, and 3+ comorbidities:    past medical history significant for hypertension, diabetes mellitus, asthma, dementia, GERD and remote history of smokingare also affecting patient's functional outcome.   REHAB POTENTIAL: Fair time since back injury  CLINICAL DECISION MAKING: Stable/uncomplicated  EVALUATION COMPLEXITY: Low   GOALS: Goals reviewed with patient? Yes  SHORT TERM GOALS=LONG TERM GOALS: due to length of POC   LONG TERM GOALS: Target date: 05/06/2023  Pt will be independent with final HEP for improved  strength, balance, transfers and gait and for increased independence with management of pain symptoms. Baseline:  Goal status: INITIAL  2.  Pt will improve her score on the Oswestry to </= 43/50 to demonstrate decreased disability level. Baseline: 48/50 Goal status: INITIAL  3.  Pt will be able to perform bed mobility with no more than Supervision assist Baseline: min A at times due to pain (7/26) Goal status: INITIAL   PLAN:  PT FREQUENCY: 1x/week  PT DURATION: 4 weeks  PLANNED INTERVENTIONS: Therapeutic exercises, Therapeutic activity, Neuromuscular re-education, Balance training, Gait training, Patient/Family education, Self Care, Joint mobilization, DME instructions, Aquatic Therapy, Dry Needling, Electrical stimulation, Spinal manipulation, Spinal mobilization, Cryotherapy, Moist heat, Taping, Traction, Manual therapy, and Re-evaluation.  PLAN FOR NEXT SESSION: review + update HEP for core strengthening/stability: TA contract with supine exercises, sciatic nerve glides if tolerated, hip flexor and quad strengthening (SAQ) if pt can tolerate supine position, prone press-ups, childs pose, LTR***  Peter Congo, PT, DPT, CSRS  04/28/2023, 9:35 AM

## 2023-04-30 NOTE — Telephone Encounter (Signed)
PCS referral faxed to [facility].

## 2023-05-06 ENCOUNTER — Ambulatory Visit: Payer: 59 | Attending: Orthopaedic Surgery | Admitting: Physical Therapy

## 2023-05-06 ENCOUNTER — Encounter: Payer: Self-pay | Admitting: Physical Therapy

## 2023-05-06 VITALS — BP 135/72 | HR 81

## 2023-05-06 DIAGNOSIS — M5459 Other low back pain: Secondary | ICD-10-CM | POA: Insufficient documentation

## 2023-05-06 DIAGNOSIS — M6281 Muscle weakness (generalized): Secondary | ICD-10-CM | POA: Diagnosis not present

## 2023-05-06 NOTE — Therapy (Signed)
OUTPATIENT PHYSICAL THERAPY THORACOLUMBAR TREATMENT / DISCHARGE   Patient Name: Julie Colon MRN: [id number] DOB:1934-07-18, 89 y.o., female Today's Date: 05/06/2023  PHYSICAL THERAPY DISCHARGE SUMMARY  Visits from Start of Care: 4  Current functional level related to goals / functional outcomes: See goals below   Remaining deficits: Falls risk uses cane, ongoing pain provided updated final HEP   Education / Equipment: Follow up with PCP if pain does not continue to improve   Patient agrees to discharge. Patient goals were partially met. Patient is being discharged due to meeting the stated rehab goals.   END OF SESSION:  PT End of Session - 05/06/23 0856     Visit Number 4    Number of Visits 4    Date for PT Re-Evaluation 05/09/23    Authorization Type Medicare    PT Start Time [phone]    Equipment Utilized During Treatment Gait belt    Activity Tolerance Patient limited by pain    Behavior During Therapy WFL for tasks assessed/performed              Past Medical History:  Diagnosis Date   Asthma    Constipation    Dementia (HCC)    Diabetes mellitus without complication (HCC)    GERD (gastroesophageal reflux disease)    Hypertension    Rectal prolapse    Past Surgical History:  Procedure Laterality Date   none     Patient Active Problem List   Diagnosis Date Noted   Dementia with behavioral disturbance (HCC) 10/01/2020   Constipation 10/01/2020   Gastroesophageal reflux disease without esophagitis 04/20/2020   Iron deficiency 04/20/2020   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   Essential hypertension 03/20/2020   Hyponatremia 03/02/2020   Sepsis due to pneumonia (HCC) 03/02/2020   Noncompliance 03/02/2020   Leukocytosis 03/02/2020   Advanced age 52/09/2019   Pneumonia 03/02/2020   Hyperglycemic hyperosmolar nonketotic coma (HCC) 01/14/2020   Type 2 diabetes mellitus with complication, without long-term current use of insulin (HCC) 01/14/2020    Hyperkalemia 01/14/2020   AKI (acute kidney injury) (HCC) 01/14/2020    PCP: Jonah Blue, MD  REFERRING PROVIDER: Marcene Corning, MD  REFERRING DIAG: M54.50 (ICD-10-CM) - Low back pain M48.00 (ICD-10-CM) - Spinal stenosis  Rationale for Evaluation and Treatment: Rehabilitation  THERAPY DIAG:  Muscle weakness (generalized)  Other low back pain  ONSET DATE: 03/18/2023 (referral date)  SUBJECTIVE:                                                                                                                                                                                           SUBJECTIVE  STATEMENT: Pt arrives late for her appointment, family had taken her to the office next door accidentally. Pt reports that she feels like her back pain has improved and she has been doing her exercises. Pt also reports that she feels like her heart is beating very fast and that this has been going on since Saturday.  Accompanied by: self (daughter [name] had to leave to take her son to school) Use of [employer] video interpreter for [place] Creole: Nicolette Bang [phone]  PERTINENT HISTORY:  past medical history significant for hypertension, diabetes mellitus, asthma, dementia, GERD and remote history of smoking  PAIN:  Are you having pain? Yes: NPRS scale: 8/10 Pain location: low back into thighs, has numbness in legs Pain description: achy, numbness into legs Aggravating factors: sitting for a long period of time Relieving factors: Tylenol and Zoloft have helped a little bit  PRECAUTIONS: Fall  RED FLAGS: Bowel or bladder incontinence: Yes: very rare but has had some B/B incontinence    WEIGHT BEARING RESTRICTIONS: No  FALLS:  Has patient fallen in last 6 months? No  LIVING ENVIRONMENT: Lives with: lives with their family Lives in: House/apartment Pt stays on first floor, no issues with stairs  OCCUPATION: retired  PLOF: Independent with gait and Independent with transfers  PATIENT  GOALS: "to make back better, decrease pain"  NEXT MD VISIT: supposed to get an injection in back from Dr. Jerl Santos, office to call and schedule  OBJECTIVE:   DIAGNOSTIC FINDINGS:  Lumbar Spine MRI 03/08/2023 Disc levels:   T12- L1: Unremarkable.   L1-L2: Disc narrowing and bulging with mild facet spurring.   L2-L3: Disc narrowing and endplate degeneration with circumferential disc bulging and endplate spurring. Mild bilateral foraminal narrowing. Mild to moderate spinal stenosis   L3-L4: Disc narrowing and circumferential bulging. Facet spurring and ligamentum flavum thickening. Mild-to-moderate spinal stenosis.   L4-L5: Disc narrowing and bulging with posterior annular fissure. Facet spurring and ligamentum flavum thickening. Mild triangular narrowing of the thecal sac. The foramina are patent   L5-S1:Disc narrowing with endplate and facet spurring. The canal and foramina are patent.   IMPRESSION: 1. Marrow edema at the L5 superior endplate, likely reactive to chronic degeneration and remote superior endplate fracture when correlated with recent CT. 2. Generalized lumbar spine degeneration with retrolisthesis at L2-3. There is mild to moderate spinal stenosis at L2-3 and L3-4. 3. 7 mm endometrial thickness is notable for age, correlate for abnormal uterine bleeding.  PATIENT SURVEYS:  Modified Oswestry 48/50   SCREENING FOR RED FLAGS: Bowel or bladder incontinence: Yes: very rare but has had a recent episode of B/B incontinence in bed and pt was unaware Spinal tumors: No Cauda equina syndrome: No Compression fracture: No Abdominal aneurysm: No  COGNITION: Overall cognitive status: Difficulty to assess due to: language barrier      SENSATION: N/T in BLE (down posterior thighs)  POSTURE: rounded shoulders and forward head  PALPATION: TTP in paraspinals in lower back but pain travels all the way up spine Muscle tightness in paraspinals all along spine, lower back  > mid and upper back  LUMBAR ROM:   AROM eval  Flexion WFL, very painful  Extension 50%, mildy painful  Right lateral flexion 25%  Left lateral flexion 25%  Right rotation [facility] but very painful  Left rotation WFL but very painful   (Blank rows = not tested)  LOWER EXTREMITY ROM:     Active  Right eval Left eval  Hip flexion Decreased due to back pain  Decreased due to back pain  Hip extension    Hip abduction    Hip adduction    Hip internal rotation    Hip external rotation    Knee flexion    Knee extension [facility]  Ankle dorsiflexion    Ankle plantarflexion    Ankle inversion    Ankle eversion     (Blank rows = not tested)  LOWER EXTREMITY MMT:    MMT Right eval Left eval  Hip flexion 3 4  Hip extension    Hip abduction    Hip adduction    Hip internal rotation    Hip external rotation    Knee flexion 4 4  Knee extension 4 4  Ankle dorsiflexion 4 4  Ankle plantarflexion    Ankle inversion    Ankle eversion     (Blank rows = not tested)  LUMBAR SPECIAL TESTS:  Slump test: Positive Unable to lift up LE for SLR due to hip flex and quad weakness  GAIT: Distance walked: various clinic distances Assistive device utilized: Single point cane Level of assistance: CGA Comments: frequent reaching out for wall to steady herself, per dtr should be using a RW but only uses a SPC  TODAY'S TREATMENT:                                                                                                                               Vitals:   05/06/23 0859  BP: 135/72  Pulse: 81   Seated vitals assessed at beginning of session.  TherEx: SciFit lvl 3 x 8 min for gentle LE mobility and strengthening without UE due to shoulder pain   TherAct: Modified Oswestery: 41/50 = 82% Impairment Bed mobility Mod independent for supine to sit and sit to supine (discussed modifications for bed at home as needed)    PATIENT EDUCATION:  Education details: Continue HEP + follow up  with PCP if pain does not improve Person educated: Patient and Child(ren) Education method: Explanation Education comprehension: needs further education  HOME EXERCISE PROGRAM: Access Code: [email or web address] URL: [email or web address] Date: 04/21/2023 Prepared by: Maryruth Eve  Exercises - Supine Transversus Abdominis Bracing - Hands on Stomach  - 1 x daily - 7 x weekly - 3 sets - 10 reps - Supine March  - 1 x daily - 7 x weekly - 3 sets - 10 reps - Supine Single Knee to Chest Stretch  - 1 x daily - 7 x weekly - 3 sets - 30 seconds hold - Supine Lower Trunk Rotation  - 1 x daily - 7 x weekly - 2 sets - 10 reps - Standing March with Counter Support  - 1 x daily - 7 x weekly - 2 sets - 10 reps - Standing Hip Abduction with Counter Support  - 1 x daily - 7 x weekly - 2 sets - 10 reps  ASSESSMENT:  CLINICAL IMPRESSION: Patient D/C from physical therapy due to maximal  rehab potential at this time. Patient improved functionally as noted below. Introduced HEP and requires caregiver direction given patient's cognitive status. Limited improvements in pain despite reported compliance with HEP though demonstrated functional improvement on modified Oswestry; recommend follow up with PCP for management as needed. Patient and caregiver verbalized understanding.   OBJECTIVE IMPAIRMENTS: Abnormal gait, decreased activity tolerance, decreased balance, decreased knowledge of condition, decreased knowledge of use of DME, decreased mobility, difficulty walking, decreased ROM, decreased strength, increased fascial restrictions, impaired perceived functional ability, impaired sensation, improper body mechanics, postural dysfunction, and pain.   ACTIVITY LIMITATIONS: carrying, lifting, bending, sitting, standing, stairs, transfers, bed mobility, and continence  PARTICIPATION LIMITATIONS: meal prep, cleaning, laundry, driving, and community activity  PERSONAL FACTORS: Age, Time since onset of  injury/illness/exacerbation, and 3+ comorbidities:    past medical history significant for hypertension, diabetes mellitus, asthma, dementia, GERD and remote history of smokingare also affecting patient's functional outcome.   REHAB POTENTIAL: Fair time since back injury  CLINICAL DECISION MAKING: Stable/uncomplicated  EVALUATION COMPLEXITY: Low   GOALS: Goals reviewed with patient? Yes  SHORT TERM GOALS=LONG TERM GOALS: due to length of POC   LONG TERM GOALS: Target date: 05/06/2023  Pt will be independent with final HEP for improved strength, balance, transfers and gait and for increased independence with management of pain symptoms. Baseline: Reports understanding exercises well Goal status: MET  2.  Pt will improve her score on the Oswestry to </= 43/50 to demonstrate decreased disability level. Baseline: 48/50, 41/50 Goal status: MET  3.  Pt will be able to perform bed mobility with no more than Supervision assist Baseline: min A at times due to pain (7/26); modI Goal status: MET PLAN:  PT FREQUENCY: 1x/week  PT DURATION: 4 weeks  PLANNED INTERVENTIONS: Therapeutic exercises, Therapeutic activity, Neuromuscular re-education, Balance training, Gait training, Patient/Family education, Self Care, Joint mobilization, DME instructions, Aquatic Therapy, Dry Needling, Electrical stimulation, Spinal manipulation, Spinal mobilization, Cryotherapy, Moist heat, Taping, Traction, Manual therapy, and Re-evaluation.  PLAN FOR NEXT SESSION: Not indicated - patient D/C with updated HEP  Maryruth Eve, PT, DPT 05/06/2023, 8:59 AM

## 2023-05-07 ENCOUNTER — Other Ambulatory Visit: Payer: Self-pay

## 2023-05-09 ENCOUNTER — Other Ambulatory Visit: Payer: Self-pay

## 2023-05-27 DIAGNOSIS — M48062 Spinal stenosis, lumbar region with neurogenic claudication: Secondary | ICD-10-CM | POA: Diagnosis not present

## 2023-06-10 ENCOUNTER — Other Ambulatory Visit: Payer: Self-pay

## 2023-06-23 ENCOUNTER — Other Ambulatory Visit: Payer: Self-pay

## 2023-07-07 ENCOUNTER — Encounter: Payer: Self-pay | Admitting: Internal Medicine

## 2023-07-07 ENCOUNTER — Other Ambulatory Visit: Payer: Self-pay

## 2023-07-07 ENCOUNTER — Ambulatory Visit: Payer: 59 | Attending: Internal Medicine | Admitting: Internal Medicine

## 2023-07-07 VITALS — BP 106/64 | HR 77 | Wt 138.2 lb

## 2023-07-07 DIAGNOSIS — E1159 Type 2 diabetes mellitus with other circulatory complications: Secondary | ICD-10-CM | POA: Diagnosis not present

## 2023-07-07 DIAGNOSIS — Z23 Encounter for immunization: Secondary | ICD-10-CM

## 2023-07-07 DIAGNOSIS — I152 Hypertension secondary to endocrine disorders: Secondary | ICD-10-CM

## 2023-07-07 DIAGNOSIS — E1165 Type 2 diabetes mellitus with hyperglycemia: Secondary | ICD-10-CM

## 2023-07-07 DIAGNOSIS — E119 Type 2 diabetes mellitus without complications: Secondary | ICD-10-CM

## 2023-07-07 DIAGNOSIS — F0280 Dementia in other diseases classified elsewhere without behavioral disturbance: Secondary | ICD-10-CM

## 2023-07-07 DIAGNOSIS — Z7984 Long term (current) use of oral hypoglycemic drugs: Secondary | ICD-10-CM

## 2023-07-07 DIAGNOSIS — M48061 Spinal stenosis, lumbar region without neurogenic claudication: Secondary | ICD-10-CM

## 2023-07-07 DIAGNOSIS — Z794 Long term (current) use of insulin: Secondary | ICD-10-CM | POA: Diagnosis not present

## 2023-07-07 DIAGNOSIS — G301 Alzheimer's disease with late onset: Secondary | ICD-10-CM | POA: Diagnosis not present

## 2023-07-07 DIAGNOSIS — R600 Localized edema: Secondary | ICD-10-CM

## 2023-07-07 MED ORDER — FUROSEMIDE 20 MG PO TABS
ORAL_TABLET | ORAL | 3 refills | Status: DC
  Filled 2023-07-07: qty 12, 84d supply, fill #0
  Filled 2023-10-01: qty 12, 84d supply, fill #1

## 2023-07-07 NOTE — Progress Notes (Signed)
Patient ID: Julie Colon, female    DOB: 03/08/1934  MRN: [id number]  CC: Medical Management of Chronic Issues and Medication Refill   Subjective: Julie Colon is a 89 y.o. female who presents for chronic ds management. [name],her daughter, is with her.  Pt and daughter have requested for daughter to interpret instead of using our video interpreting services.  Wavier was signed to decline use of our interpreter Her concerns today include:  Patient with history of DM type II, HTN, asthma, GERD, dementia, RT LL lung nodule CT 04/2022  Dementia: Patient lives with [facility] up until this summer.  She is no longer able to participate so came back to reestablish care with [place] in August.  She had seen our PA.  They had requested PCS services.  Daughter states that it was declined but she is not sure why.  She would like for me to resubmit it.  Patient needs help with bathing, clothing, meal preps.  Daughter works part-time.  Patient's grandson stays with her sometimes when daughter is at work.  Daughter denies any behavioral issues like wandering off.  She does not allow her to go around the stove at all.  She reports that patient is sleeping and eating okay. Daughter sets up and administers her medicines.  Complains of chronic pain in the lower back.  Apparently she is seeing a chiropractor every 3 months and gets injection to the back every 3 months.  Last 1 was in August.  Daughter states that the injections have not helped much.Marland Kitchen MRI done in June of this year revealed the following findings: IMPRESSION: 1. Marrow edema at the L5 superior endplate, likely reactive to chronic degeneration and remote superior endplate fracture when correlated with recent CT. 2. Generalized lumbar spine degeneration with retrolisthesis at L2-3. There is mild to moderate spinal stenosis at L2-3 and L3-4. 3. 7 mm endometrial thickness is notable for age, correlate for abnormal uterine bleeding.  In regards to  her diabetes, patient is on Lantus 20 units daily and metformin 500 mg twice a day.  Daughter does not check blood sugar regularly.  She is eating okay.  Last A1c done 3 months ago was 6.3. She is on Lopressor 25 mg twice a day hydralazine 100 mg 3 times a day and Norvasc 10 mg daily for blood pressure.  Taking consistently.  She is also on Crestor 10 mg daily for cholesterol.  She has swelling in both legs.  Left leg has always been a little bigger than the right but more increased recently.  Patient reports some pain in the left popliteal area over the past few days.  Had a pair of compression socks but they are now worn out.  Patient Active Problem List   Diagnosis Date Noted   Dementia with behavioral disturbance (HCC) 10/01/2020   Constipation 10/01/2020   Gastroesophageal reflux disease without esophagitis 04/20/2020   Iron deficiency 04/20/2020   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   Essential hypertension 03/20/2020   Hyponatremia 03/02/2020   Sepsis due to pneumonia (HCC) 03/02/2020   Noncompliance 03/02/2020   Leukocytosis 03/02/2020   Advanced age 13/09/2019   Pneumonia 03/02/2020   Hyperglycemic hyperosmolar nonketotic coma (HCC) 01/14/2020   Type 2 diabetes mellitus with complication, without long-term current use of insulin (HCC) 01/14/2020   Hyperkalemia 01/14/2020   AKI (acute kidney injury) (HCC) 01/14/2020     Current Outpatient Medications on File Prior to Visit  Medication Sig Dispense Refill   Accu-Chek FastClix  Lancets MISC UAD 100 each 1   acetaminophen (TYLENOL) 325 MG tablet Take 650 mg by mouth every 6 (six) hours as needed.     albuterol (VENTOLIN HFA) 108 (90 Base) MCG/ACT inhaler Inhale into the lungs every 6 (six) hours as needed for wheezing or shortness of breath.     amLODipine (NORVASC) 10 MG tablet Take 1 tablet (10 mg total) by mouth daily. 90 tablet 1   aspirin EC 81 MG tablet Take 1 tablet (81 mg total) by mouth daily. Swallow whole.     bisacodyl  (FLEET) 10 MG/30ML ENEM Place 10 mg rectally once. As needed for severe constipation.     Blood Glucose Monitoring Suppl (ACCU-CHEK GUIDE ME) w/Device KIT UAD 1 kit 0   cholecalciferol (VITAMIN D3) 25 MCG (1000 UNIT) tablet Take 1 tablet (1,000 Units total) by mouth in the morning and at bedtime. 30 tablet 2   cyclobenzaprine (FLEXERIL) 10 MG tablet Take 0.5 tablets (5 mg total) by mouth 2 (two) times daily as needed for muscle spasms. 20 tablet 0   doxycycline (VIBRAMYCIN) 100 MG capsule Take 1 capsule (100 mg total) by mouth 2 (two) times daily. One po bid x 7 days 14 capsule 0   Elastic Bandages & Supports (MEDICAL COMPRESSION STOCKINGS) MISC Use 2 stockings daily. 2 each 3   glucose blood (ACCU-CHEK GUIDE) test strip Check BS once a day 100 each 12   hydrALAZINE (APRESOLINE) 100 MG tablet Take 1 tablet (100 mg total) by mouth 3 (three) times daily. 270 tablet 1   insulin glargine (LANTUS SOLOSTAR) 100 UNIT/ML Solostar Pen Inject 20 Units into the skin daily. 15 mL 3   metFORMIN (GLUCOPHAGE) 500 MG tablet Take 1 tablet (500 mg total) by mouth 2 (two) times daily with a meal. 60 tablet 5   metoprolol tartrate (LOPRESSOR) 25 MG tablet Take 1 tablet (25 mg total) by mouth 2 (two) times daily. 60 tablet 5   pantoprazole (PROTONIX) 20 MG tablet Take 1 tablet (20 mg total) by mouth daily. 30 tablet 3   PEG-KCl-NaCl-NaSulf-Na Asc-C (PLENVU) 140 g SOLR Take 140 g by mouth as directed. 1 each 0   rosuvastatin (CRESTOR) 10 MG tablet Take 1 tablet (10 mg total) by mouth daily. 90 tablet 3   ferrous sulfate 325 (65 FE) MG tablet TAKE 1 TABLET (325 MG TOTAL) BY MOUTH DAILY WITH BREAKFAST. 100 tablet 1   Current Facility-Administered Medications on File Prior to Visit  Medication Dose Route Frequency Provider Last Rate Last Admin   0.9 %  sodium chloride infusion  500 mL Intravenous Once Armbruster, Willaim Rayas, MD        No Known Allergies  Social History   Socioeconomic History   Marital status: Single     Spouse name: Not on file   Number of children: Not on file   Years of education: Not on file   Highest education level: Not on file  Occupational History   Not on file  Tobacco Use   Smoking status: Former   Smokeless tobacco: Never  Vaping Use   Vaping status: Never Used  Substance and Sexual Activity   Alcohol use: No   Drug use: No   Sexual activity: Not Currently  Other Topics Concern   Not on file  Social History Narrative   Speaks [place] (from the [place])   Moved to [facility] May 1. 2021 from [place].  Daughter not well versed in mother's care yet   Social Determinants of Health  Financial Resource Strain: Not on file  Food Insecurity: Not on file  Transportation Needs: Not on file  Physical Activity: Not on file  Stress: Not on file  Social Connections: Not on file  Intimate Partner Violence: Not on file    Family History  Problem Relation Age of Onset   Diabetes Mother    Hypertension Mother    Colon cancer Neg Hx    Stomach cancer Neg Hx    Pancreatic cancer Neg Hx    Esophageal cancer Neg Hx    Liver disease Neg Hx     Past Surgical History:  Procedure Laterality Date   none      ROS: Review of Systems Negative except as stated above  PHYSICAL EXAM: BP 106/64 (BP Location: Right Arm, Patient Position: Sitting, Cuff Size: Normal)   Pulse 77   Wt 138 lb 3.2 oz (62.7 kg)   SpO2 95%   BMI 27.91 kg/m   Physical Exam  General appearance - alert, well appearing, elderly female and in no distress.  Clothing is clean.  She appears adequately nourished. Mental status -patient answers some questions appropriately. Mouth -oral mucosa is moist. Neck - supple, no significant adenopathy Chest - clear to auscultation, no wheezes, rales or rhonchi, symmetric air entry Heart - normal rate, regular rhythm, normal S1, S2, no murmurs, rubs, clicks or gallops Extremities -1+ bilateral lower extremity edema left greater than right.  She has a cane       Latest Ref Rng & Units 04/28/2023   10:57 AM 03/08/2023    5:26 AM 04/09/2022    2:22 PM  CMP  Glucose 70 - 99 mg/dL 213  086  578   BUN 8 - 23 mg/dL 18  17  20    Creatinine 0.44 - 1.00 mg/dL 4.69  6.29  5.28   Sodium 135 - 145 mmol/L 139  138  136   Potassium 3.5 - 5.1 mmol/L 5.2  4.2  4.4   Chloride 98 - 111 mmol/L 97  96  99   CO2 22 - 32 mmol/L 29  30  33   Calcium 8.9 - 10.3 mg/dL 9.7  9.7  9.1   Total Protein 6.5 - 8.1 g/dL  7.6  7.7   Total Bilirubin 0.3 - 1.2 mg/dL  0.5  0.3   Alkaline Phos 38 - 126 U/L  59  64   AST 15 - 41 U/L  28  15   ALT 0 - 44 U/L  12  10    Lipid Panel  No results found for: "CHOL", "TRIG", "HDL", "CHOLHDL", "VLDL", "LDLCALC", "LDLDIRECT"  CBC    Component Value Date/Time   WBC 6.4 04/28/2023 1057   RBC 3.85 (L) 04/28/2023 1057   HGB 11.3 (L) 04/28/2023 1057   HGB 12.2 04/09/2022 1422   HGB 14.3 09/29/2020 1140   HCT 37.9 04/28/2023 1057   HCT 43.2 09/29/2020 1140   PLT 159 04/28/2023 1057   PLT 325 04/09/2022 1422   PLT 286 09/29/2020 1140   MCV 98.4 04/28/2023 1057   MCV 90 09/29/2020 1140   MCH 29.4 04/28/2023 1057   MCHC 29.8 (L) 04/28/2023 1057   RDW 14.1 04/28/2023 1057   RDW 11.2 (L) 09/29/2020 1140   LYMPHSABS 3.3 03/08/2023 0526   LYMPHSABS 3.0 03/20/2020 1135   MONOABS 0.7 03/08/2023 0526   EOSABS 0.3 03/08/2023 0526   EOSABS 0.3 03/20/2020 1135   BASOSABS 0.0 03/08/2023 0526   BASOSABS 0.0 03/20/2020 1135  ASSESSMENT AND PLAN: 1. Type 2 diabetes mellitus with hyperglycemia, unspecified whether long term insulin use (HCC) Patient's daughter does not have any blood sugar logs to share.  We will check A1c today with her other labs.  Continue Lantus 20 units daily and metformin. - Hemoglobin A1c - Comprehensive metabolic panel - Lipid panel - Ambulatory referral to Ophthalmology  2. Insulin long-term use (HCC) #1 above  3. Diabetes mellitus treated with oral medication (HCC) See #1 above.  4. Hypertension associated  with type 2 diabetes mellitus (HCC) Controlled.  Continue Lopressor 25 mg twice a day, hydralazine 100 mg 3 times a day and Norvasc 10 mg daily.   5. Late onset Alzheimer's dementia without behavioral disturbance, psychotic disturbance, mood disturbance, or anxiety, unspecified dementia severity (HCC) Patient with good family support.  Daughter does not report any behavioral issues at this time. Message sent to our caseworker to get her set up for a home health aide.  Patient meets criteria of having to skilled needs and having dementia.  6. Edema of left lower leg Advised daughter to purchase a new pair of compression socks below the knee.  Will prescribe a low-dose of furosemide to use 1-2 times a week as needed to help decrease swelling. - VAS [place] LOWER EXTREMITY VENOUS (DVT); Future - furosemide (LASIX) 20 MG tablet; Take 1/2 tablet by mouth 1-2 times a week as need for swelling in legs  Dispense: 20 tablet; Refill: 3  7. Spinal stenosis of lumbar region, unspecified whether neurogenic claudication present - Ambulatory referral to Pain Clinic  8. Need for immunization against influenza - Flu Vaccine Trivalent High Dose (Fluad)  9. Need for Streptococcus pneumoniae vaccination - Pneumococcal conjugate vaccine 20-valent    Patient was given the opportunity to ask questions.  Patient verbalized understanding of the plan and was able to repeat key elements of the plan.   This documentation was completed using Paediatric nurse.  Any transcriptional errors are unintentional.  No orders of the defined types were placed in this encounter.    Requested Prescriptions    No prescriptions requested or ordered in this encounter    No follow-ups on file.  Jonah Blue, MD, FACP

## 2023-07-08 ENCOUNTER — Telehealth: Payer: Self-pay

## 2023-07-08 ENCOUNTER — Telehealth: Payer: Self-pay | Admitting: Internal Medicine

## 2023-07-08 ENCOUNTER — Ambulatory Visit (HOSPITAL_COMMUNITY): Admission: RE | Admit: 2023-07-08 | Payer: 59 | Source: Ambulatory Visit

## 2023-07-08 ENCOUNTER — Other Ambulatory Visit: Payer: Self-pay

## 2023-07-08 DIAGNOSIS — E875 Hyperkalemia: Secondary | ICD-10-CM

## 2023-07-08 LAB — LIPID PANEL
Chol/HDL Ratio: 1.7 ratio (ref 0.0–4.4)
Cholesterol, Total: 136 mg/dL (ref 100–199)
HDL: 82 mg/dL (ref >39)
LDL Chol Calc (NIH): 37 mg/dL (ref 0–99)
Triglycerides: 95 mg/dL (ref 0–149)
VLDL Cholesterol Cal: 17 mg/dL (ref 5–40)

## 2023-07-08 LAB — COMPREHENSIVE METABOLIC PANEL
ALT: 13 [IU]/L (ref 0–32)
AST: 19 [IU]/L (ref 0–40)
Albumin: 4.2 g/dL (ref 3.7–4.7)
Alkaline Phosphatase: 78 [IU]/L (ref 44–121)
BUN/Creatinine Ratio: 14 (ref 12–28)
BUN: 14 mg/dL (ref 8–27)
Bilirubin Total: 0.3 mg/dL (ref 0.0–1.2)
CO2: 25 mmol/L (ref 20–29)
Calcium: 9.8 mg/dL (ref 8.7–10.3)
Chloride: 100 mmol/L (ref 96–106)
Creatinine, Ser: 1.03 mg/dL — ABNORMAL HIGH (ref 0.57–1.00)
Globulin, Total: 3.1 g/dL (ref 1.5–4.5)
Glucose: 144 mg/dL — ABNORMAL HIGH (ref 70–99)
Potassium: 6.1 mmol/L — ABNORMAL HIGH (ref 3.5–5.2)
Sodium: 140 mmol/L (ref 134–144)
Total Protein: 7.3 g/dL (ref 6.0–8.5)
eGFR: 53 mL/min/{1.73_m2} — ABNORMAL LOW (ref >59)

## 2023-07-08 LAB — HEMOGLOBIN A1C
Est. average glucose Bld gHb Est-mCnc: 137 mg/dL
Hgb A1c MFr Bld: 6.4 % — ABNORMAL HIGH (ref 4.8–5.6)

## 2023-07-08 MED ORDER — LOKELMA 5 G PO PACK
PACK | ORAL | 0 refills | Status: DC
  Filled 2023-07-08: qty 1, 1d supply, fill #0

## 2023-07-08 NOTE — Telephone Encounter (Signed)
Phone call placed to patient's daughter this morning to go over lab results. Informed [name] that patient's potassium level is elevated which is concerning because high potassium can cause her heart to go into abnormal rhythm.  Kidney function has also declined with GFR of 53.  Back in August it was greater than 60. I note that patient is not on an ACE inhibitor or ARB.  She is not on any potassium supplement.  I inquired whether patient eats a lot of bananas or oranges.  Daughter states that patient eats a banana and orange every morning.  Advised to have her stop the bananas and oranges and eat some other fruits as these are potassium rich foods. -Will give one-time dose of Lokelma and have her return to the lab on Friday to have potassium level rechecked. -Drink adequate fluids during the day. -Continue with plan for low-dose Lasix twice a week.  If lower extremity edema does not improve with this, we will have her stop the amlodipine and have her take the low-dose Lasix every day.  Results for orders placed or performed in visit on 07/07/23  Hemoglobin A1c  Result Value Ref Range   Hgb A1c MFr Bld 6.4 (H) 4.8 - 5.6 %   Est. average glucose Bld gHb Est-mCnc 137 mg/dL  Comprehensive metabolic panel  Result Value Ref Range   Glucose 144 (H) 70 - 99 mg/dL   BUN 14 8 - 27 mg/dL   Creatinine, Ser 7.84 (H) 0.57 - 1.00 mg/dL   eGFR 53 (L) >69 GE/XBM/8.41   BUN/Creatinine Ratio 14 12 - 28   Sodium 140 134 - 144 mmol/L   Potassium 6.1 (H) 3.5 - 5.2 mmol/L   Chloride 100 96 - 106 mmol/L   CO2 25 20 - 29 mmol/L   Calcium 9.8 8.7 - 10.3 mg/dL   Total Protein 7.3 6.0 - 8.5 g/dL   Albumin 4.2 3.7 - 4.7 g/dL   Globulin, Total 3.1 1.5 - 4.5 g/dL   Bilirubin Total 0.3 0.0 - 1.2 mg/dL   Alkaline Phosphatase 78 44 - 121 IU/L   AST 19 0 - 40 IU/L   ALT 13 0 - 32 IU/L  Lipid panel  Result Value Ref Range   Cholesterol, Total 136 100 - 199 mg/dL   Triglycerides 95 0 - 149 mg/dL   HDL 82 >32 mg/dL    VLDL Cholesterol Cal 17 5 - 40 mg/dL   LDL Chol Calc (NIH) 37 0 - 99 mg/dL   Chol/HDL Ratio 1.7 0.0 - 4.4 ratio

## 2023-07-08 NOTE — Telephone Encounter (Signed)
PCS request faxed to [id number]

## 2023-07-08 NOTE — Telephone Encounter (Signed)
Called patient and daughter, left voicemail  Patient has appointment at 3 at [facility]

## 2023-07-08 NOTE — Telephone Encounter (Signed)
Called patient and daughter again and left voicemail following up on previous voicemail

## 2023-07-10 NOTE — Telephone Encounter (Signed)
Pt returning call

## 2023-07-10 NOTE — Telephone Encounter (Signed)
Pt called in about making an appt or what appt is for, needs more info. Please cb

## 2023-07-11 NOTE — Telephone Encounter (Signed)
Called the daughter and she is aware of the reason for the scan   Called the vascular center and left voicemail for scheduling

## 2023-07-14 ENCOUNTER — Ambulatory Visit (HOSPITAL_COMMUNITY)
Admission: RE | Admit: 2023-07-14 | Discharge: 2023-07-14 | Disposition: A | Discharge status: Home / Self Care | Payer: 59 | Source: Ambulatory Visit | Attending: Internal Medicine | Admitting: Internal Medicine

## 2023-07-14 ENCOUNTER — Other Ambulatory Visit: Payer: Self-pay

## 2023-07-14 ENCOUNTER — Ambulatory Visit: Payer: 59 | Attending: Internal Medicine

## 2023-07-14 DIAGNOSIS — E875 Hyperkalemia: Secondary | ICD-10-CM

## 2023-07-14 DIAGNOSIS — R600 Localized edema: Secondary | ICD-10-CM | POA: Insufficient documentation

## 2023-07-14 NOTE — Telephone Encounter (Signed)
Called and got appointment scheduled and patient daughter is aware of time and place

## 2023-07-15 LAB — BASIC METABOLIC PANEL
BUN/Creatinine Ratio: 20 (ref 12–28)
BUN: 21 mg/dL (ref 8–27)
CO2: 24 mmol/L (ref 20–29)
Calcium: 9.6 mg/dL (ref 8.7–10.3)
Chloride: 97 mmol/L (ref 96–106)
Creatinine, Ser: 1.06 mg/dL — ABNORMAL HIGH (ref 0.57–1.00)
Glucose: 227 mg/dL — ABNORMAL HIGH (ref 70–99)
Potassium: 4.6 mmol/L (ref 3.5–5.2)
Sodium: 139 mmol/L (ref 134–144)
eGFR: 51 mL/min/{1.73_m2} — ABNORMAL LOW (ref >59)

## 2023-07-16 ENCOUNTER — Other Ambulatory Visit: Payer: Self-pay

## 2023-07-18 ENCOUNTER — Other Ambulatory Visit: Payer: Self-pay

## 2023-07-25 DIAGNOSIS — Z0000 Encounter for general adult medical examination without abnormal findings: Secondary | ICD-10-CM | POA: Diagnosis not present

## 2023-08-01 ENCOUNTER — Other Ambulatory Visit: Payer: Self-pay

## 2023-08-19 ENCOUNTER — Ambulatory Visit: Payer: 59 | Attending: Internal Medicine

## 2023-08-25 ENCOUNTER — Other Ambulatory Visit: Payer: Self-pay

## 2023-08-26 ENCOUNTER — Other Ambulatory Visit: Payer: Self-pay

## 2023-08-26 ENCOUNTER — Other Ambulatory Visit: Payer: Self-pay | Admitting: Physician Assistant

## 2023-08-26 DIAGNOSIS — K219 Gastro-esophageal reflux disease without esophagitis: Secondary | ICD-10-CM

## 2023-08-26 DIAGNOSIS — Z0000 Encounter for general adult medical examination without abnormal findings: Secondary | ICD-10-CM | POA: Diagnosis not present

## 2023-08-28 ENCOUNTER — Other Ambulatory Visit: Payer: Self-pay

## 2023-08-28 MED ORDER — PANTOPRAZOLE SODIUM 20 MG PO TBEC
20.0000 mg | DELAYED_RELEASE_TABLET | Freq: Every day | ORAL | 3 refills | Status: DC
  Filled 2023-08-28: qty 30, 30d supply, fill #0
  Filled 2023-10-01: qty 30, 30d supply, fill #1
  Filled 2023-11-07: qty 30, 30d supply, fill #2
  Filled 2023-12-31: qty 30, 30d supply, fill #3

## 2023-08-28 NOTE — Telephone Encounter (Signed)
Requested Prescriptions  Pending Prescriptions Disp Refills   pantoprazole (PROTONIX) 20 MG tablet 30 tablet 3    Sig: Take 1 tablet (20 mg total) by mouth daily.     Gastroenterology: Proton Pump Inhibitors Passed - 08/28/2023  9:04 AM      Passed - Valid encounter within last 12 months    Recent Outpatient Visits           1 month ago Type 2 diabetes mellitus with hyperglycemia, unspecified whether long term insulin use (HCC)   [facility] - A Dept Of [place]. [facility] Jonah Blue B, MD   4 months ago Type 2 diabetes mellitus with hyperglycemia, unspecified whether long term insulin use (HCC)   [facility] - A Dept Of [place]. [facility] [place], [place], [place]   2 years ago Dementia with behavioral disturbance, unspecified dementia type [facility])   [facility] - A Dept Of [place]. [facility] Marcine Matar, MD   3 years ago Need for influenza vaccination   [facility] - A Dept Of [place]. [facility] Drucilla Chalet, RPH-CPP   3 years ago Cough   [facility] - A Dept Of [place]. [facility] Marcine Matar, MD       Future Appointments             In 1 month Laural Benes Binnie Rail, MD [facility] - A Dept Of [place]igha Brideg[place]. [facility] [place]

## 2023-08-29 ENCOUNTER — Other Ambulatory Visit: Payer: Self-pay

## 2023-09-15 ENCOUNTER — Other Ambulatory Visit: Payer: Self-pay

## 2023-09-15 DIAGNOSIS — M48062 Spinal stenosis, lumbar region with neurogenic claudication: Secondary | ICD-10-CM | POA: Diagnosis not present

## 2023-09-17 ENCOUNTER — Other Ambulatory Visit: Payer: Self-pay

## 2023-10-01 ENCOUNTER — Other Ambulatory Visit: Payer: Self-pay | Admitting: Physician Assistant

## 2023-10-01 ENCOUNTER — Other Ambulatory Visit: Payer: Self-pay

## 2023-10-01 DIAGNOSIS — I10 Essential (primary) hypertension: Secondary | ICD-10-CM

## 2023-10-01 DIAGNOSIS — M48062 Spinal stenosis, lumbar region with neurogenic claudication: Secondary | ICD-10-CM | POA: Diagnosis not present

## 2023-10-01 MED ORDER — AMLODIPINE BESYLATE 10 MG PO TABS
10.0000 mg | ORAL_TABLET | Freq: Every day | ORAL | 0 refills | Status: DC
  Filled 2023-10-01: qty 30, 30d supply, fill #0

## 2023-10-01 MED ORDER — HYDRALAZINE HCL 100 MG PO TABS
100.0000 mg | ORAL_TABLET | Freq: Three times a day (TID) | ORAL | 0 refills | Status: DC
  Filled 2023-10-01: qty 90, 30d supply, fill #0

## 2023-10-06 ENCOUNTER — Ambulatory Visit: Payer: 59 | Attending: Internal Medicine | Admitting: Internal Medicine

## 2023-10-06 ENCOUNTER — Encounter: Payer: Self-pay | Admitting: Internal Medicine

## 2023-10-06 ENCOUNTER — Other Ambulatory Visit: Payer: Self-pay

## 2023-10-06 VITALS — BP 125/65 | HR 77 | Temp 97.8°F | Ht 59.0 in | Wt 136.0 lb

## 2023-10-06 DIAGNOSIS — F0280 Dementia in other diseases classified elsewhere without behavioral disturbance: Secondary | ICD-10-CM

## 2023-10-06 DIAGNOSIS — Z794 Long term (current) use of insulin: Secondary | ICD-10-CM

## 2023-10-06 DIAGNOSIS — Z23 Encounter for immunization: Secondary | ICD-10-CM | POA: Diagnosis not present

## 2023-10-06 DIAGNOSIS — H9193 Unspecified hearing loss, bilateral: Secondary | ICD-10-CM | POA: Diagnosis not present

## 2023-10-06 DIAGNOSIS — G301 Alzheimer's disease with late onset: Secondary | ICD-10-CM

## 2023-10-06 DIAGNOSIS — Z780 Asymptomatic menopausal state: Secondary | ICD-10-CM

## 2023-10-06 DIAGNOSIS — I152 Hypertension secondary to endocrine disorders: Secondary | ICD-10-CM | POA: Diagnosis not present

## 2023-10-06 DIAGNOSIS — E1165 Type 2 diabetes mellitus with hyperglycemia: Secondary | ICD-10-CM

## 2023-10-06 DIAGNOSIS — L299 Pruritus, unspecified: Secondary | ICD-10-CM | POA: Diagnosis not present

## 2023-10-06 DIAGNOSIS — N289 Disorder of kidney and ureter, unspecified: Secondary | ICD-10-CM | POA: Diagnosis not present

## 2023-10-06 DIAGNOSIS — M5416 Radiculopathy, lumbar region: Secondary | ICD-10-CM

## 2023-10-06 DIAGNOSIS — G8929 Other chronic pain: Secondary | ICD-10-CM

## 2023-10-06 DIAGNOSIS — Z7984 Long term (current) use of oral hypoglycemic drugs: Secondary | ICD-10-CM | POA: Diagnosis not present

## 2023-10-06 LAB — POCT GLYCOSYLATED HEMOGLOBIN (HGB A1C): HbA1c, POC (controlled diabetic range): 7.1 % — AB (ref 0.0–7.0)

## 2023-10-06 LAB — GLUCOSE, POCT (MANUAL RESULT ENTRY): POC Glucose: 314 mg/dL — AB (ref 70–99)

## 2023-10-06 MED ORDER — ZOSTER VAC RECOMB ADJUVANTED 50 MCG/0.5ML IM SUSR
0.5000 mL | Freq: Once | INTRAMUSCULAR | 0 refills | Status: AC
  Filled 2023-10-06: qty 1, 1d supply, fill #0

## 2023-10-06 MED ORDER — LANTUS SOLOSTAR 100 UNIT/ML ~~LOC~~ SOPN
20.0000 [IU] | PEN_INJECTOR | Freq: Every day | SUBCUTANEOUS | 3 refills | Status: DC
  Filled 2023-10-06 – 2023-12-31 (×2): qty 15, 75d supply, fill #0

## 2023-10-06 MED ORDER — ZOSTER VAC RECOMB ADJUVANTED 50 MCG/0.5ML IM SUSR: 0.5000 mL | Freq: Once | INTRAMUSCULAR | 0 refills | Status: AC

## 2023-10-06 NOTE — Patient Instructions (Addendum)
I have referred you to this practice for eye exam.  Please call them to schedule.   [facility], [place].  [phone] [place], [place] [address] [phone] Fax [phone]   Please purchase a vitamin D 400 IU over-the-counter and take 1 daily. Please purchase calcium 600 mg over-the-counter and take 1 tablet twice a day.

## 2023-10-06 NOTE — Progress Notes (Signed)
Patient ID: Julie Colon, female    DOB: 03/03/1934  MRN: [id number]  CC: Diabetes (DM f/u. Med refill. /Itchiness on both hands /Requesting DME for back support during bed time - steroid injection not alleviating pain/Discuss Vit D3/Yes to shingles & Tdap vax)   Subjective: Julie Colon is a 89 y.o. female who presents for chronic ds management. [name],her daughter, is with her.  Waiver to find in our video interpreter was side on last visit.  Daughter interprets. Her concerns today include:  Patient with history of , DM type II, HTN, asthma, GERD, Dementia, chronic LBP (DDD, moderate spinal stenosis on MRI 02/2023), RT LL lung nodule CT 04/2022   Dementia:  receiving PCS 3 hrs/day No behavior issues Sleeping okay; has to take 2 Tylenol at nights to sleep due to ongoing issue with back pain  LBP: gets inj to back Q 3 mths through [facility], Dr Modesto Charon.  Recent inj 10/02/2023.  Inj does not help. Pain radiates to both legs. Not interested in surgery option. Ambulates with cane, no falls. When she wakes up back is stiff Has back brace but feels it is too tight around the waist, can not breath when she wears it.  It does have an adjustable Velcro strap but patient has been putting it on too tight. MRI done in June of last year revealed the following findings: IMPRESSION: 1. Marrow edema at the L5 superior endplate, likely reactive to chronic degeneration and remote superior endplate fracture when correlated with recent CT. 2. Generalized lumbar spine degeneration with retrolisthesis at L2-3. There is mild to moderate spinal stenosis at L2-3 and L3-4. 3. 7 mm endometrial thickness is notable for age, correlate for abnormal uterine bleeding.  Daughter reports that patient has problems hearing.  This has been noticeable for a while.  She would like for me to check the patient's ears.    HTN: Reports compliance with Lopressor 25 mg twice a day, hydralazine 100 mg 3 times a day and Norvasc  10 mg daily.  LE edema better with low dose Furosemide added on last visit.  Patient does not limit salt in the foods as much as she should. Patient noted to have some renal insufficiency with GFR decreasing from greater than 60 over the past several months to 51.  She is not on NSAIDs.  C/o itching in both hands this morning.  No rash. No new body products  DM: taking Lantus 20 units daily and Metformin 500 mg BID Daughter checks blood sugar but not every day.  Patient does okay with eating habits. Patient Active Problem List   Diagnosis Date Noted   Constipation 10/01/2020   Gastroesophageal reflux disease without esophagitis 04/20/2020   Iron deficiency 04/20/2020   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   Essential hypertension 03/20/2020   Hyponatremia 03/02/2020   Sepsis due to pneumonia (HCC) 03/02/2020   Noncompliance 03/02/2020   Leukocytosis 03/02/2020   Advanced age 60/09[birth year]2019   Pneumonia 03/02/2020   Hyperglycemic hyperosmolar nonketotic coma (HCC) 01/14/2020   Type 2 diabetes mellitus with complication, without long-term current use of insulin (HCC) 01/14/2020   Hyperkalemia 01/14/2020   AKI (acute kidney injury) (HCC) 01/14/2020     Current Outpatient Medications on File Prior to Visit  Medication Sig Dispense Refill   Accu-Chek FastClix Lancets MISC UAD 100 each 1   acetaminophen (TYLENOL) 325 MG tablet Take 650 mg by mouth every 6 (six) hours as needed.     albuterol (VENTOLIN HFA) 108 (90  Base) MCG/ACT inhaler Inhale into the lungs every 6 (six) hours as needed for wheezing or shortness of breath.     amLODipine (NORVASC) 10 MG tablet Take 1 tablet (10 mg total) by mouth daily. 30 tablet 0   aspirin EC 81 MG tablet Take 1 tablet (81 mg total) by mouth daily. Swallow whole.     bisacodyl (FLEET) 10 MG/30ML ENEM Place 10 mg rectally once. As needed for severe constipation.     Blood Glucose Monitoring Suppl (ACCU-CHEK GUIDE ME) w/Device KIT UAD 1 kit 0   cholecalciferol  (VITAMIN D3) 25 MCG (1000 UNIT) tablet Take 1 tablet (1,000 Units total) by mouth in the morning and at bedtime. 30 tablet 2   cyclobenzaprine (FLEXERIL) 10 MG tablet Take 0.5 tablets (5 mg total) by mouth 2 (two) times daily as needed for muscle spasms. 20 tablet 0   Elastic Bandages & Supports (MEDICAL COMPRESSION STOCKINGS) MISC Use 2 stockings daily. 2 each 3   ferrous sulfate 325 (65 FE) MG tablet TAKE 1 TABLET (325 MG TOTAL) BY MOUTH DAILY WITH BREAKFAST. 100 tablet 1   furosemide (LASIX) 20 MG tablet Take 1/2 tablet by mouth 1-2 times a week as need for swelling in legs 20 tablet 3   glucose blood (ACCU-CHEK GUIDE) test strip Check BS once a day 100 each 12   hydrALAZINE (APRESOLINE) 100 MG tablet Take 1 tablet (100 mg total) by mouth 3 (three) times daily. 90 tablet 0   metFORMIN (GLUCOPHAGE) 500 MG tablet Take 1 tablet (500 mg total) by mouth 2 (two) times daily with a meal. 60 tablet 5   metoprolol tartrate (LOPRESSOR) 25 MG tablet Take 1 tablet (25 mg total) by mouth 2 (two) times daily. 60 tablet 5   pantoprazole (PROTONIX) 20 MG tablet Take 1 tablet (20 mg total) by mouth daily. 30 tablet 3   PEG-KCl-NaCl-NaSulf-Na Asc-C (PLENVU) 140 g SOLR Take 140 g by mouth as directed. 1 each 0   rosuvastatin (CRESTOR) 10 MG tablet Take 1 tablet (10 mg total) by mouth daily. 90 tablet 3   No current facility-administered medications on file prior to visit.    No Known Allergies  Social History   Socioeconomic History   Marital status: Single    Spouse name: Not on file   Number of children: Not on file   Years of education: Not on file   Highest education level: Not on file  Occupational History   Not on file  Tobacco Use   Smoking status: Former   Smokeless tobacco: Never  Vaping Use   Vaping status: Never Used  Substance and Sexual Activity   Alcohol use: No   Drug use: No   Sexual activity: Not Currently  Other Topics Concern   Not on file  Social History Narrative   Speaks  [place] (from the [place])   Moved to [facility] May 1. 2021 from [place].  Daughter not well versed in mother's care yet   Social Drivers of Health   Financial Resource Strain: Low Risk  (10/06/2023)   Overall Financial Resource Strain (CARDIA)    Difficulty of Paying Living Expenses: Not very hard  Food Insecurity: No Food Insecurity (10/06/2023)   Hunger Vital Sign    Worried About Running Out of Food in the Last Year: Never true    Ran Out of Food in the Last Year: Never true  Transportation Needs: No Transportation Needs (10/06/2023)   PRAPARE - Administrator, Civil Service (Medical):  No    Lack of Transportation (Non-Medical): No  Physical Activity: Inactive (10/06/2023)   Exercise Vital Sign    Days of Exercise per Week: 0 days    Minutes of Exercise per Session: 0 min  Stress: Stress Concern Present (10/06/2023)   [employer] of Occupational Health - Occupational Stress Questionnaire    Feeling of Stress : To some extent  Social Connections: Moderately Integrated (10/06/2023)   Social Connection and Isolation Panel [NHANES]    Frequency of Communication with Friends and Family: More than three times a week    Frequency of Social Gatherings with Friends and Family: More than three times a week    Attends Religious Services: More than 4 times per year    Active Member of [employer] or Organizations: Yes    Attends Banker Meetings: More than 4 times per year    Marital Status: Never married  Intimate Partner Violence: Not At Risk (10/06/2023)   Humiliation, Afraid, Rape, and Kick questionnaire    Fear of Current or Ex-Partner: No    Emotionally Abused: No    Physically Abused: No    Sexually Abused: No    Family History  Problem Relation Age of Onset   Diabetes Mother    Hypertension Mother    Colon cancer Neg Hx    Stomach cancer Neg Hx    Pancreatic cancer Neg Hx    Esophageal cancer Neg Hx    Liver disease Neg Hx     Past Surgical History:  Procedure  Laterality Date   none      ROS: Review of Systems Negative except as stated above  PHYSICAL EXAM: BP 125/65   Pulse 77   Temp 97.8 F (36.6 C) (Oral)   Ht 4\' 11"  (1.499 m)   Wt 136 lb (61.7 kg)   SpO2 97%   BMI 27.47 kg/m   Physical Exam  General appearance - alert, well appearing, and in no distress Mental status -daughter helps with giving some of the history. Mouth - mucous membranes moist, pharynx normal without lesions Neck - supple, no significant adenopathy Chest - clear to auscultation, no wheezes, rales or rhonchi, symmetric air entry Heart - normal rate, regular rhythm, normal S1, S2, no murmurs, rubs, clicks or gallops Extremities - peripheral pulses normal, no pedal edema, no clubbing or cyanosis      Latest Ref Rng & Units 07/14/2023   10:51 AM 07/07/2023   12:29 PM 04/28/2023   10:57 AM  CMP  Glucose 70 - 99 mg/dL 244  010  272   BUN 8 - 27 mg/dL 21  14  18    Creatinine 0.57 - 1.00 mg/dL 5.36  6.44  0.34   Sodium 134 - 144 mmol/L 139  140  139   Potassium 3.5 - 5.2 mmol/L 4.6  6.1  5.2   Chloride 96 - 106 mmol/L 97  100  97   CO2 20 - 29 mmol/L 24  25  29    Calcium 8.7 - 10.3 mg/dL 9.6  9.8  9.7   Total Protein 6.0 - 8.5 g/dL  7.3    Total Bilirubin 0.0 - 1.2 mg/dL  0.3    Alkaline Phos 44 - 121 IU/L  78    AST 0 - 40 IU/L  19    ALT 0 - 32 IU/L  13     Lipid Panel     Component Value Date/Time   CHOL 136 07/07/2023 1229   TRIG 95  07/07/2023 1229   HDL 82 07/07/2023 1229   CHOLHDL 1.7 07/07/2023 1229   LDLCALC 37 07/07/2023 1229    CBC    Component Value Date/Time   WBC 6.4 04/28/2023 1057   RBC 3.85 (L) 04/28/2023 1057   HGB 11.3 (L) 04/28/2023 1057   HGB 12.2 04/09/2022 1422   HGB 14.3 09/29/2020 1140   HCT 37.9 04/28/2023 1057   HCT 43.2 09/29/2020 1140   PLT 159 04/28/2023 1057   PLT 325 04/09/2022 1422   PLT 286 09/29/2020 1140   MCV 98.4 04/28/2023 1057   MCV 90 09/29/2020 1140   MCH 29.4 04/28/2023 1057   MCHC 29.8 (L)  04/28/2023 1057   RDW 14.1 04/28/2023 1057   RDW 11.2 (L) 09/29/2020 1140   LYMPHSABS 3.3 03/08/2023 0526   LYMPHSABS 3.0 03/20/2020 1135   MONOABS 0.7 03/08/2023 0526   EOSABS 0.3 03/08/2023 0526   EOSABS 0.3 03/20/2020 1135   BASOSABS 0.0 03/08/2023 0526   BASOSABS 0.0 03/20/2020 1135    ASSESSMENT AND PLAN: 1. Type 2 diabetes mellitus with hyperglycemia, unspecified whether long term insulin use (HCC) (Primary) At goal.  Continue Lantus insulin 20 units daily and metformin 500 mg twice a day. - POCT glycosylated hemoglobin (Hb A1C) - POCT glucose (manual entry) - insulin glargine (LANTUS SOLOSTAR) 100 UNIT/ML Solostar Pen; Inject 20 Units into the skin daily.  Dispense: 15 mL; Refill: 3 - CBC  2. Chronic radicular pain of lower back Patient has been receiving ESI every 3 months without much improvement.  Discussed referral to pain management and daughter and patient are agreeable to this.  Advised on using her back brace, the strap is adjustable so that she does not have to pull it too tight. - Ambulatory referral to Pain Clinic  3. Renal insufficiency Recheck renal function today.  Advised to avoid NSAIDs. - Basic Metabolic Panel  4. Decreased hearing of both ears ENT referral submitted  5. Late onset Alzheimer's dementia without behavioral disturbance, psychotic disturbance, mood disturbance, or anxiety, unspecified dementia severity (HCC) Stable.  Patient is under the care of her daughter and doing well.  She now receives [facility] services which helps.  6. Itching of both hands No rash noted at this time and skin does not appear dry.  7. Postmenopausal estrogen deficiency Daughter and patient agreeable for her to have bone density study.  Continue vitamin D supplement.  Advised to purchase calcium 600 mg over-the-counter and take 1 twice a day. - DG BONE DENSITY (DXA); Future  8. Need for shingles vaccine Prescription given for Shingrix vaccine for her to take to our  pharmacy downstairs. - Zoster Vaccine Adjuvanted [facility]) injection; Inject 0.5 mLs into the muscle once for 1 dose.  Dispense: 0.5 mL; Refill: 0  9. Encounter for immunization - Tdap vaccine greater than or equal to 7yo IM  10.  Hypertension associated with type 2 diabetes At goal.  Continue Norvasc 10 mg daily, Lopressor 25 mg twice a day and hydralazine 100 mg 3 times a day.  Patient was given the opportunity to ask questions.  Patient verbalized understanding of the plan and was able to repeat key elements of the plan.   This documentation was completed using Paediatric nurse.  Any transcriptional errors are unintentional.  Orders Placed This Encounter  Procedures   DG BONE DENSITY (DXA)   Tdap vaccine greater than or equal to 7yo IM   CBC   Basic Metabolic Panel   Ambulatory referral to  Pain Clinic   POCT glycosylated hemoglobin (Hb A1C)   POCT glucose (manual entry)     Requested Prescriptions   Signed Prescriptions Disp Refills   insulin glargine (LANTUS SOLOSTAR) 100 UNIT/ML Solostar Pen 15 mL 3    Sig: Inject 20 Units into the skin daily.   Zoster Vaccine Adjuvanted [facility]) injection 0.5 mL 0    Sig: Inject 0.5 mLs into the muscle once for 1 dose.    Return in about 4 months (around 02/03/2024) for Give appt with CMA for Medicare Wellness Visit in 1 mth.  Jonah Blue, MD, FACP

## 2023-10-07 LAB — CBC
Hematocrit: 38.2 % (ref 34.0–46.6)
Hemoglobin: 12.9 g/dL (ref 11.1–15.9)
MCH: 30.2 pg (ref 26.6–33.0)
MCHC: 33.8 g/dL (ref 31.5–35.7)
MCV: 90 fL (ref 79–97)
Platelets: 366 10*3/uL (ref 150–450)
RBC: 4.27 x10E6/uL (ref 3.77–5.28)
RDW: 11.9 % (ref 11.7–15.4)
WBC: 9.2 10*3/uL (ref 3.4–10.8)

## 2023-10-07 LAB — BASIC METABOLIC PANEL
BUN/Creatinine Ratio: 22 (ref 12–28)
BUN: 19 mg/dL (ref 8–27)
CO2: 27 mmol/L (ref 20–29)
Calcium: 9.7 mg/dL (ref 8.7–10.3)
Chloride: 95 mmol/L — ABNORMAL LOW (ref 96–106)
Creatinine, Ser: 0.85 mg/dL (ref 0.57–1.00)
Glucose: 248 mg/dL — ABNORMAL HIGH (ref 70–99)
Potassium: 5.2 mmol/L (ref 3.5–5.2)
Sodium: 137 mmol/L (ref 134–144)
eGFR: 66 mL/min/{1.73_m2} (ref >59)

## 2023-10-16 ENCOUNTER — Encounter (INDEPENDENT_AMBULATORY_CARE_PROVIDER_SITE_OTHER): Payer: Self-pay | Admitting: Otolaryngology

## 2023-10-23 ENCOUNTER — Encounter: Payer: Self-pay | Admitting: Physical Medicine & Rehabilitation

## 2023-10-27 DIAGNOSIS — Z0000 Encounter for general adult medical examination without abnormal findings: Secondary | ICD-10-CM | POA: Diagnosis not present

## 2023-11-07 ENCOUNTER — Other Ambulatory Visit: Payer: Self-pay

## 2023-11-07 ENCOUNTER — Telehealth: Payer: Self-pay | Admitting: *Deleted

## 2023-11-07 ENCOUNTER — Other Ambulatory Visit: Payer: Self-pay | Admitting: Internal Medicine

## 2023-11-07 DIAGNOSIS — I10 Essential (primary) hypertension: Secondary | ICD-10-CM

## 2023-11-07 MED ORDER — AMLODIPINE BESYLATE 10 MG PO TABS
10.0000 mg | ORAL_TABLET | Freq: Every day | ORAL | 0 refills | Status: DC
  Filled 2023-11-07: qty 90, 90d supply, fill #0

## 2023-11-07 NOTE — Telephone Encounter (Signed)
 Provided this information: Placed in [facility]  [address] Ph# [phone]  Appointment December 23, 2023 at 1p.

## 2023-11-07 NOTE — Telephone Encounter (Signed)
 Copied from CRM [id number]. Topic: Clinical - Medical Advice >> Nov 07, 2023  1:27 PM Shelah Lewandowsky wrote: Reason for CRM: Victorino Dike with [employer] calling about office notes requested, they were received but nothing mentioned about incontinence, please call [phone]

## 2023-11-07 NOTE — Telephone Encounter (Signed)
 Copied from CRM [id number]. Topic: General - Other >> Nov 07, 2023  1:40 PM Emylou G wrote: Reason for CRM: Patient is calling for address detail information for her [facility] appt.  Please advise by calling her back

## 2023-11-08 ENCOUNTER — Emergency Department (HOSPITAL_COMMUNITY)

## 2023-11-08 ENCOUNTER — Inpatient Hospital Stay (HOSPITAL_COMMUNITY)
Admission: EM | Admit: 2023-11-08 | Discharge: 2023-11-13 | DRG: 061 | Disposition: A | Discharge status: Home Health | Attending: Neurology | Admitting: Neurology

## 2023-11-08 ENCOUNTER — Encounter (HOSPITAL_COMMUNITY): Payer: Self-pay

## 2023-11-08 DIAGNOSIS — Z603 Acculturation difficulty: Secondary | ICD-10-CM | POA: Diagnosis present

## 2023-11-08 DIAGNOSIS — G8191 Hemiplegia, unspecified affecting right dominant side: Secondary | ICD-10-CM | POA: Diagnosis present

## 2023-11-08 DIAGNOSIS — R531 Weakness: Secondary | ICD-10-CM | POA: Diagnosis not present

## 2023-11-08 DIAGNOSIS — R29702 NIHSS score 2: Secondary | ICD-10-CM | POA: Diagnosis not present

## 2023-11-08 DIAGNOSIS — R29723 NIHSS score 23: Secondary | ICD-10-CM | POA: Diagnosis present

## 2023-11-08 DIAGNOSIS — M7989 Other specified soft tissue disorders: Secondary | ICD-10-CM | POA: Diagnosis present

## 2023-11-08 DIAGNOSIS — R414 Neurologic neglect syndrome: Secondary | ICD-10-CM | POA: Diagnosis present

## 2023-11-08 DIAGNOSIS — I6339 Cerebral infarction due to thrombosis of other cerebral artery: Secondary | ICD-10-CM | POA: Diagnosis not present

## 2023-11-08 DIAGNOSIS — I440 Atrioventricular block, first degree: Secondary | ICD-10-CM | POA: Diagnosis present

## 2023-11-08 DIAGNOSIS — E785 Hyperlipidemia, unspecified: Secondary | ICD-10-CM | POA: Diagnosis present

## 2023-11-08 DIAGNOSIS — R000 Tachycardia, unspecified: Secondary | ICD-10-CM | POA: Diagnosis not present

## 2023-11-08 DIAGNOSIS — Z794 Long term (current) use of insulin: Secondary | ICD-10-CM

## 2023-11-08 DIAGNOSIS — E119 Type 2 diabetes mellitus without complications: Secondary | ICD-10-CM | POA: Diagnosis present

## 2023-11-08 DIAGNOSIS — Z7984 Long term (current) use of oral hypoglycemic drugs: Secondary | ICD-10-CM

## 2023-11-08 DIAGNOSIS — I10 Essential (primary) hypertension: Secondary | ICD-10-CM | POA: Diagnosis present

## 2023-11-08 DIAGNOSIS — R233 Spontaneous ecchymoses: Secondary | ICD-10-CM | POA: Diagnosis not present

## 2023-11-08 DIAGNOSIS — Z87891 Personal history of nicotine dependence: Secondary | ICD-10-CM | POA: Diagnosis not present

## 2023-11-08 DIAGNOSIS — W19XXXA Unspecified fall, initial encounter: Secondary | ICD-10-CM | POA: Diagnosis not present

## 2023-11-08 DIAGNOSIS — T45615A Adverse effect of thrombolytic drugs, initial encounter: Secondary | ICD-10-CM | POA: Diagnosis not present

## 2023-11-08 DIAGNOSIS — I672 Cerebral atherosclerosis: Secondary | ICD-10-CM | POA: Diagnosis not present

## 2023-11-08 DIAGNOSIS — I6503 Occlusion and stenosis of bilateral vertebral arteries: Secondary | ICD-10-CM | POA: Diagnosis not present

## 2023-11-08 DIAGNOSIS — H5340 Unspecified visual field defects: Secondary | ICD-10-CM | POA: Diagnosis present

## 2023-11-08 DIAGNOSIS — Z751 Person awaiting admission to adequate facility elsewhere: Secondary | ICD-10-CM

## 2023-11-08 DIAGNOSIS — G319 Degenerative disease of nervous system, unspecified: Secondary | ICD-10-CM | POA: Diagnosis not present

## 2023-11-08 DIAGNOSIS — J45909 Unspecified asthma, uncomplicated: Secondary | ICD-10-CM | POA: Diagnosis present

## 2023-11-08 DIAGNOSIS — E669 Obesity, unspecified: Secondary | ICD-10-CM | POA: Diagnosis present

## 2023-11-08 DIAGNOSIS — I63211 Cerebral infarction due to unspecified occlusion or stenosis of right vertebral arteries: Secondary | ICD-10-CM | POA: Diagnosis not present

## 2023-11-08 DIAGNOSIS — I6522 Occlusion and stenosis of left carotid artery: Secondary | ICD-10-CM | POA: Diagnosis not present

## 2023-11-08 DIAGNOSIS — Z833 Family history of diabetes mellitus: Secondary | ICD-10-CM | POA: Diagnosis not present

## 2023-11-08 DIAGNOSIS — I639 Cerebral infarction, unspecified: Secondary | ICD-10-CM | POA: Diagnosis present

## 2023-11-08 DIAGNOSIS — G459 Transient cerebral ischemic attack, unspecified: Secondary | ICD-10-CM | POA: Diagnosis not present

## 2023-11-08 DIAGNOSIS — I6501 Occlusion and stenosis of right vertebral artery: Secondary | ICD-10-CM | POA: Diagnosis present

## 2023-11-08 DIAGNOSIS — Z7982 Long term (current) use of aspirin: Secondary | ICD-10-CM

## 2023-11-08 DIAGNOSIS — I6381 Other cerebral infarction due to occlusion or stenosis of small artery: Principal | ICD-10-CM | POA: Diagnosis present

## 2023-11-08 DIAGNOSIS — R29810 Facial weakness: Secondary | ICD-10-CM | POA: Diagnosis present

## 2023-11-08 DIAGNOSIS — R4701 Aphasia: Secondary | ICD-10-CM | POA: Diagnosis present

## 2023-11-08 DIAGNOSIS — K219 Gastro-esophageal reflux disease without esophagitis: Secondary | ICD-10-CM | POA: Diagnosis present

## 2023-11-08 DIAGNOSIS — I619 Nontraumatic intracerebral hemorrhage, unspecified: Secondary | ICD-10-CM | POA: Diagnosis not present

## 2023-11-08 DIAGNOSIS — D6832 Hemorrhagic disorder due to extrinsic circulating anticoagulants: Secondary | ICD-10-CM | POA: Diagnosis not present

## 2023-11-08 DIAGNOSIS — Z79899 Other long term (current) drug therapy: Secondary | ICD-10-CM

## 2023-11-08 DIAGNOSIS — F0390 Unspecified dementia without behavioral disturbance: Secondary | ICD-10-CM | POA: Diagnosis present

## 2023-11-08 DIAGNOSIS — Z8249 Family history of ischemic heart disease and other diseases of the circulatory system: Secondary | ICD-10-CM

## 2023-11-08 DIAGNOSIS — I6623 Occlusion and stenosis of bilateral posterior cerebral arteries: Secondary | ICD-10-CM | POA: Diagnosis not present

## 2023-11-08 DIAGNOSIS — Z6828 Body mass index (BMI) 28.0-28.9, adult: Secondary | ICD-10-CM

## 2023-11-08 DIAGNOSIS — R29818 Other symptoms and signs involving the nervous system: Secondary | ICD-10-CM | POA: Diagnosis not present

## 2023-11-08 DIAGNOSIS — I6389 Other cerebral infarction: Secondary | ICD-10-CM | POA: Diagnosis not present

## 2023-11-08 DIAGNOSIS — I6782 Cerebral ischemia: Secondary | ICD-10-CM | POA: Diagnosis not present

## 2023-11-08 LAB — I-STAT CHEM 8, ED
BUN: 19 mg/dL (ref 8–23)
Calcium, Ion: 1.11 mmol/L — ABNORMAL LOW (ref 1.15–1.40)
Chloride: 103 mmol/L (ref 98–111)
Creatinine, Ser: 0.8 mg/dL (ref 0.44–1.00)
Glucose, Bld: 166 mg/dL — ABNORMAL HIGH (ref 70–99)
HCT: 37 % (ref 36.0–46.0)
Hemoglobin: 12.6 g/dL (ref 12.0–15.0)
Potassium: 4.5 mmol/L (ref 3.5–5.1)
Sodium: 137 mmol/L (ref 135–145)
TCO2: 29 mmol/L (ref 22–32)

## 2023-11-08 LAB — COMPREHENSIVE METABOLIC PANEL
ALT: 11 U/L (ref 0–44)
AST: 18 U/L (ref 15–41)
Albumin: 3.3 g/dL — ABNORMAL LOW (ref 3.5–5.0)
Alkaline Phosphatase: 54 U/L (ref 38–126)
Anion gap: 12 (ref 5–15)
BUN: 17 mg/dL (ref 8–23)
CO2: 27 mmol/L (ref 22–32)
Calcium: 9.5 mg/dL (ref 8.9–10.3)
Chloride: 99 mmol/L (ref 98–111)
Creatinine, Ser: 0.77 mg/dL (ref 0.44–1.00)
GFR, Estimated: >60 mL/min (ref >60)
Glucose, Bld: 171 mg/dL — ABNORMAL HIGH (ref 70–99)
Potassium: 4.5 mmol/L (ref 3.5–5.1)
Sodium: 138 mmol/L (ref 135–145)
Total Bilirubin: 0.5 mg/dL (ref 0.0–1.2)
Total Protein: 7 g/dL (ref 6.5–8.1)

## 2023-11-08 LAB — DIFFERENTIAL
Abs Immature Granulocytes: 0.03 10*3/uL (ref 0.00–0.07)
Basophils Absolute: 0 10*3/uL (ref 0.0–0.1)
Basophils Relative: 0 %
Eosinophils Absolute: 0.2 10*3/uL (ref 0.0–0.5)
Eosinophils Relative: 2 %
Immature Granulocytes: 0 %
Lymphocytes Relative: 29 %
Lymphs Abs: 2.6 10*3/uL (ref 0.7–4.0)
Monocytes Absolute: 0.7 10*3/uL (ref 0.1–1.0)
Monocytes Relative: 8 %
Neutro Abs: 5.6 10*3/uL (ref 1.7–7.7)
Neutrophils Relative %: 61 %

## 2023-11-08 LAB — URINALYSIS, ROUTINE W REFLEX MICROSCOPIC
Bilirubin Urine: NEGATIVE
Glucose, UA: NEGATIVE mg/dL
Hgb urine dipstick: NEGATIVE
Ketones, ur: NEGATIVE mg/dL
Leukocytes,Ua: NEGATIVE
Nitrite: NEGATIVE
Protein, ur: NEGATIVE mg/dL
Specific Gravity, Urine: 1.023 (ref 1.005–1.030)
pH: 7 (ref 5.0–8.0)

## 2023-11-08 LAB — CBC
HCT: 36.3 % (ref 36.0–46.0)
Hemoglobin: 11.8 g/dL — ABNORMAL LOW (ref 12.0–15.0)
MCH: 29.8 pg (ref 26.0–34.0)
MCHC: 32.5 g/dL (ref 30.0–36.0)
MCV: 91.7 fL (ref 80.0–100.0)
Platelets: 260 10*3/uL (ref 150–400)
RBC: 3.96 MIL/uL (ref 3.87–5.11)
RDW: 13.7 % (ref 11.5–15.5)
WBC: 9.1 10*3/uL (ref 4.0–10.5)
nRBC: 0 % (ref 0.0–0.2)

## 2023-11-08 LAB — PROTIME-INR
INR: 0.9 (ref 0.8–1.2)
Prothrombin Time: 11.9 s (ref 11.4–15.2)

## 2023-11-08 LAB — RAPID URINE DRUG SCREEN, HOSP PERFORMED
Amphetamines: NOT DETECTED
Barbiturates: NOT DETECTED
Benzodiazepines: NOT DETECTED
Cocaine: NOT DETECTED
Opiates: NOT DETECTED
Tetrahydrocannabinol: NOT DETECTED

## 2023-11-08 LAB — APTT: aPTT: 23 s — ABNORMAL LOW (ref 24–36)

## 2023-11-08 LAB — GLUCOSE, CAPILLARY: Glucose-Capillary: 118 mg/dL — ABNORMAL HIGH (ref 70–99)

## 2023-11-08 LAB — ETHANOL: Alcohol, Ethyl (B): <10 mg/dL (ref <10)

## 2023-11-08 MED ORDER — AMLODIPINE BESYLATE 5 MG PO TABS
10.0000 mg | ORAL_TABLET | Freq: Every day | ORAL | Status: DC
  Administered 2023-11-09 – 2023-11-13 (×5): 10 mg via ORAL
  Filled 2023-11-08 (×2): qty 2
  Filled 2023-11-08: qty 1
  Filled 2023-11-08: qty 2
  Filled 2023-11-08: qty 1

## 2023-11-08 MED ORDER — ACETAMINOPHEN 325 MG PO TABS
650.0000 mg | ORAL_TABLET | ORAL | Status: DC | PRN
  Administered 2023-11-09 – 2023-11-11 (×3): 650 mg via ORAL
  Filled 2023-11-08 (×3): qty 2

## 2023-11-08 MED ORDER — ALBUTEROL SULFATE (2.5 MG/3ML) 0.083% IN NEBU
2.5000 mg | INHALATION_SOLUTION | Freq: Four times a day (QID) | RESPIRATORY_TRACT | Status: DC
  Administered 2023-11-08 – 2023-11-09 (×3): 2.5 mg via RESPIRATORY_TRACT
  Filled 2023-11-08 (×3): qty 3

## 2023-11-08 MED ORDER — ORAL CARE MOUTH RINSE: 15.0000 mL | OROMUCOSAL | Status: DC | PRN

## 2023-11-08 MED ORDER — SODIUM CHLORIDE 0.9 % IV SOLN: INTRAVENOUS | Status: AC

## 2023-11-08 MED ORDER — ACETAMINOPHEN 650 MG RE SUPP: 650.0000 mg | RECTAL | Status: DC | PRN

## 2023-11-08 MED ORDER — ROSUVASTATIN CALCIUM 5 MG PO TABS
10.0000 mg | ORAL_TABLET | Freq: Every day | ORAL | Status: DC
  Administered 2023-11-09 – 2023-11-13 (×5): 10 mg via ORAL
  Filled 2023-11-08 (×5): qty 2

## 2023-11-08 MED ORDER — TENECTEPLASE FOR STROKE
0.2500 mg/kg | PACK | Freq: Once | INTRAVENOUS | Status: AC
  Administered 2023-11-08: 16 mg via INTRAVENOUS
  Filled 2023-11-08: qty 10

## 2023-11-08 MED ORDER — INSULIN ASPART 100 UNIT/ML IJ SOLN
0.0000 [IU] | Freq: Three times a day (TID) | INTRAMUSCULAR | Status: DC
  Administered 2023-11-09 – 2023-11-10 (×2): 2 [IU] via SUBCUTANEOUS
  Administered 2023-11-10 – 2023-11-13 (×9): 3 [IU] via SUBCUTANEOUS
  Administered 2023-11-13: 11 [IU] via SUBCUTANEOUS

## 2023-11-08 MED ORDER — SENNOSIDES-DOCUSATE SODIUM 8.6-50 MG PO TABS: 1.0000 | ORAL_TABLET | Freq: Every evening | ORAL | Status: DC | PRN

## 2023-11-08 MED ORDER — IOHEXOL 350 MG/ML SOLN
75.0000 mL | Freq: Once | INTRAVENOUS | Status: AC | PRN
  Administered 2023-11-08: 75 mL via INTRAVENOUS

## 2023-11-08 MED ORDER — VITAMIN D 25 MCG (1000 UNIT) PO TABS
1000.0000 [IU] | ORAL_TABLET | Freq: Every day | ORAL | Status: DC
Start: 2023-11-09 — End: 2023-11-13
  Administered 2023-11-09 – 2023-11-13 (×5): 1000 [IU] via ORAL
  Filled 2023-11-08 (×5): qty 1

## 2023-11-08 MED ORDER — CHLORHEXIDINE GLUCONATE CLOTH 2 % EX PADS
6.0000 | MEDICATED_PAD | Freq: Every day | CUTANEOUS | Status: DC
  Administered 2023-11-09 – 2023-11-13 (×3): 6 via TOPICAL

## 2023-11-08 MED ORDER — ACETAMINOPHEN 160 MG/5ML PO SOLN: 650.0000 mg | ORAL | Status: DC | PRN

## 2023-11-08 MED ORDER — ACETAMINOPHEN 325 MG PO TABS
650.0000 mg | ORAL_TABLET | Freq: Once | ORAL | Status: AC
  Administered 2023-11-08: 650 mg via ORAL
  Filled 2023-11-08: qty 2

## 2023-11-08 MED ORDER — CLEVIDIPINE BUTYRATE 0.5 MG/ML IV EMUL
0.0000 mg/h | INTRAVENOUS | Status: DC
  Administered 2023-11-08: 2 mg/h via INTRAVENOUS
  Filled 2023-11-08: qty 50

## 2023-11-08 MED ORDER — FERROUS SULFATE 325 (65 FE) MG PO TABS
325.0000 mg | ORAL_TABLET | Freq: Every day | ORAL | Status: DC
  Administered 2023-11-09 – 2023-11-13 (×5): 325 mg via ORAL
  Filled 2023-11-08 (×5): qty 1

## 2023-11-08 MED ORDER — STROKE: EARLY STAGES OF RECOVERY BOOK
Freq: Once | Status: AC
  Filled 2023-11-08: qty 1

## 2023-11-08 MED ORDER — PANTOPRAZOLE SODIUM 40 MG IV SOLR
40.0000 mg | Freq: Every day | INTRAVENOUS | Status: DC
  Administered 2023-11-08: 40 mg via INTRAVENOUS
  Filled 2023-11-08: qty 10

## 2023-11-08 NOTE — Progress Notes (Signed)
 PHARMACIST CODE STROKE RESPONSE  Notified to mix TNK at 20:20 by Dr. Arbutus Ped TNK preparation completed at 20:22  TNK dose = 16 mg IV over 5 seconds (3.15mL)   Issues/delays encountered (if applicable): Consent, BP management   Estill Batten, PharmD, BCCCP  11/08/23 8:26 PM

## 2023-11-08 NOTE — ED Triage Notes (Signed)
 Patient BIB [facility] from home as code stroke. LKW 1700 3/8, symptoms suspected to occur around 1840 3/8. R facial droop, and right arm and leg weakness. Endorses slurred speech. Patient speaks [place].

## 2023-11-08 NOTE — H&P (Signed)
 NEUROLOGY ADMISSION HISTORY AND PHYSICAL   Date of service: November 08, 2023 Patient Name: Julie Colon MRN:  [id number] DOB:  [birth year] Chief Complaint: "***" Requesting Provider: No att. providers found  History of Present Illness  Julie Colon is a 89 y.o. female with hx of ***  LKW: 1700 Modified rankin score: 0-Completely asymptomatic and back to baseline post- stroke IV Thrombolysis: Yes EVT: ***Yes, *** No (reason)   NIHSS components Score: Comment  1a Level of Conscious 0[]  1[x]  2[]  3[]      1b LOC Questions 0[]  1[]  2[x]       1c LOC Commands 0[]  1[]  2[x]       2 Best Gaze 0[]  1[]  2[x]       3 Visual 0[]  1[]  2[x]  3[]      4 Facial Palsy 0[]  1[]  2[x]  3[]      5a Motor Arm - left 0[x]  1[]  2[]  3[]  4[]  UN[]    5b Motor Arm - Right 0[]  1[]  2[]  3[]  4[x]  UN[]    6a Motor Leg - Left 0[]  1[]  2[]  3[x]  4[]  UN[]    6b Motor Leg - Right 0[]  1[x]  2[]  3[]  4[]  UN[]    7 Limb Ataxia 0[x]  1[]  2[]  3[]  UN[]     8 Sensory 0[x]  1[]  2[]  UN[]      9 Best Language 0[]  1[]  2[x]  3[]      10 Dysarthria 0[]  1[x]  2[]  UN[]      11 Extinct. and Inattention 0[]  1[x]  2[]       TOTAL:   23      ROS  ***Comprehensive ROS performed and pertinent positives documented in HPI  ***Unable to ascertain due to ***  Past History   Past Medical History:  Diagnosis Date   Asthma    Constipation    Dementia (HCC)    Diabetes mellitus without complication (HCC)    GERD (gastroesophageal reflux disease)    Hypertension    Rectal prolapse     Past Surgical History:  Procedure Laterality Date   none      Family History: Family History  Problem Relation Age of Onset   Diabetes Mother    Hypertension Mother    Colon cancer Neg Hx    Stomach cancer Neg Hx    Pancreatic cancer Neg Hx    Esophageal cancer Neg Hx    Liver disease Neg Hx     Social History  reports that she has quit smoking. She has never used smokeless tobacco. She reports that she does not drink alcohol and does not use drugs.  No Known  Allergies  Medications  No current facility-administered medications for this encounter.  Current Outpatient Medications:    Accu-Chek FastClix Lancets MISC, UAD, Disp: 100 each, Rfl: 1   acetaminophen (TYLENOL) 325 MG tablet, Take 650 mg by mouth every 6 (six) hours as needed., Disp: , Rfl:    albuterol (VENTOLIN HFA) 108 (90 Base) MCG/ACT inhaler, Inhale into the lungs every 6 (six) hours as needed for wheezing or shortness of breath., Disp: , Rfl:    amLODipine (NORVASC) 10 MG tablet, Take 1 tablet (10 mg total) by mouth daily., Disp: 90 tablet, Rfl: 0   aspirin EC 81 MG tablet, Take 1 tablet (81 mg total) by mouth daily. Swallow whole., Disp: , Rfl:    bisacodyl (FLEET) 10 MG/30ML ENEM, Place 10 mg rectally once. As needed for severe constipation., Disp: , Rfl:    Blood Glucose Monitoring Suppl (ACCU-CHEK GUIDE ME) w/Device KIT, UAD, Disp: 1 kit, Rfl: 0   cholecalciferol (VITAMIN D3) 25  MCG (1000 UNIT) tablet, Take 1 tablet (1,000 Units total) by mouth in the morning and at bedtime., Disp: 30 tablet, Rfl: 2   cyclobenzaprine (FLEXERIL) 10 MG tablet, Take 0.5 tablets (5 mg total) by mouth 2 (two) times daily as needed for muscle spasms., Disp: 20 tablet, Rfl: 0   Elastic Bandages & Supports (MEDICAL COMPRESSION STOCKINGS) MISC, Use 2 stockings daily., Disp: 2 each, Rfl: 3   ferrous sulfate 325 (65 FE) MG tablet, TAKE 1 TABLET (325 MG TOTAL) BY MOUTH DAILY WITH BREAKFAST., Disp: 100 tablet, Rfl: 1   furosemide (LASIX) 20 MG tablet, Take 1/2 tablet by mouth 1-2 times a week as need for swelling in legs, Disp: 20 tablet, Rfl: 3   glucose blood (ACCU-CHEK GUIDE) test strip, Check BS once a day, Disp: 100 each, Rfl: 12   hydrALAZINE (APRESOLINE) 100 MG tablet, Take 1 tablet (100 mg total) by mouth 3 (three) times daily., Disp: 90 tablet, Rfl: 0   insulin glargine (LANTUS SOLOSTAR) 100 UNIT/ML Solostar Pen, Inject 20 Units into the skin daily., Disp: 15 mL, Rfl: 3   metFORMIN (GLUCOPHAGE) 500 MG  tablet, Take 1 tablet (500 mg total) by mouth 2 (two) times daily with a meal., Disp: 60 tablet, Rfl: 5   metoprolol tartrate (LOPRESSOR) 25 MG tablet, Take 1 tablet (25 mg total) by mouth 2 (two) times daily., Disp: 60 tablet, Rfl: 5   pantoprazole (PROTONIX) 20 MG tablet, Take 1 tablet (20 mg total) by mouth daily., Disp: 30 tablet, Rfl: 3   PEG-KCl-NaCl-NaSulf-Na Asc-C (PLENVU) 140 g SOLR, Take 140 g by mouth as directed., Disp: 1 each, Rfl: 0   rosuvastatin (CRESTOR) 10 MG tablet, Take 1 tablet (10 mg total) by mouth daily., Disp: 90 tablet, Rfl: 3  Vitals  There were no vitals filed for this visit.  There is no height or weight on file to calculate BMI.  Physical Exam   Constitutional: Appears well-developed and well-nourished. *** Psych: Affect appropriate to situation. *** Eyes: No scleral injection. *** HENT: No OP obstruction. *** Head: Normocephalic. *** Cardiovascular: Normal rate and regular rhythm. *** Respiratory: Effort normal, non-labored breathing. *** GI: Soft.  No distension. There is no tenderness. *** Skin: WDI. ***  Neurologic Examination   ***  Labs/Imaging/Neurodiagnostic studies   CBC: No results for input(s): "WBC", "NEUTROABS", "HGB", "HCT", "MCV", "PLT" in the last 168 hours. Basic Metabolic Panel:  Lab Results  Component Value Date   NA 137 10/06/2023   K 5.2 10/06/2023   CO2 27 10/06/2023   GLUCOSE 248 (H) 10/06/2023   BUN 19 10/06/2023   CREATININE 0.85 10/06/2023   CALCIUM 9.7 10/06/2023   GFRNONAA >60 04/28/2023   GFRAA 87 09/29/2020   Lipid Panel:  Lab Results  Component Value Date   LDLCALC 37 07/07/2023   HgbA1c:  Lab Results  Component Value Date   HGBA1C 7.1 (A) 10/06/2023      ASSESSMENT  Julie Colon is a 89 y.o. female presenting with acute onset of right facial droop, right sided weakness and slurred speech. LKN 1700 - Exam reveals *** - CT head: Small age-indeterminate right occipital infarct. No acute hemorrhage.  Chronic microvascular ischemic disease. - CTA of head and neck: ***   RECOMMENDATIONS  *** ______________________________________________________________________    Dessa Phi, Axxel Gude, MD [facility]

## 2023-11-08 NOTE — Code Documentation (Addendum)
 Stroke Response Nurse Documentation Code Documentation  Julie Colon is a 89 y.o. female arriving to [facility]  via [facility] on 11/08/2023 with past medical hx of dementia, DM, HTN. On No antithrombotic. Code stroke was activated by EMS.   Patient from home where she was LKW at 1700 and now complaining of right facial droop, aphasia, slurred speech, right gaze deviation.   Stroke team at the bedside on patient arrival. Labs drawn and patient cleared for CT by Dr. Rush Landmark. Patient to CT with team. NIHSS 22, see documentation for details and code stroke times. Patient with disoriented, not following commands, right gaze preference , right hemianopia, right facial droop, bilateral arm weakness, bilateral leg weakness, Global aphasia , dysarthria , and Visual  neglect on exam. The following imaging was completed:  CT Head and CTA. Patient is a candidate for IV Thrombolytic due to no signs of bleed. Patient is not a candidate for IR due to no LVO.   Care Plan: vital signs and NIH Q15 x2 hours, Q30 x 6 hours, Q1x16 hours. Keep blood pressure under 180/105  Bedside handoff with ED RN Marcello Moores lane.    Nechama Guard  Stroke Response RN

## 2023-11-08 NOTE — ED Provider Notes (Signed)
 [place] EMERGENCY DEPARTMENT AT [facility] Provider Note   CSN: [id number] Arrival date & time: 11/08/23  1952  An emergency department physician performed an initial assessment on this suspected stroke patient at 36.  History  Chief Complaint  Patient presents with   Code Stroke    Julie Colon is a 89 y.o. female.  HPI   89 year old female presents emergency department as a code stroke.  Last known normal was approximately 5 PM.  Around 640 there was reported right-sided facial droop, right arm and leg weakness and change in speech.  Of note patient speaks [place].  Initially no family at bedside and report from EMS.  History limited secondary to acuity.  Home Medications Prior to Admission medications   Medication Sig Start Date End Date Taking? Authorizing Provider  Accu-Chek FastClix Lancets MISC UAD 04/20/20   Marcine Matar, MD  acetaminophen (TYLENOL) 325 MG tablet Take 650 mg by mouth every 6 (six) hours as needed.    [provider]  albuterol (VENTOLIN HFA) 108 (90 Base) MCG/ACT inhaler Inhale into the lungs every 6 (six) hours as needed for wheezing or shortness of breath.    [provider]  amLODipine (NORVASC) 10 MG tablet Take 1 tablet (10 mg total) by mouth daily. 11/07/23   Marcine Matar, MD  aspirin EC 81 MG tablet Take 1 tablet (81 mg total) by mouth daily. Swallow whole. 04/03/23   Anders Simmonds, PA-C  bisacodyl (FLEET) 10 MG/30ML ENEM Place 10 mg rectally once. As needed for severe constipation.    [provider]  Blood Glucose Monitoring Suppl (ACCU-CHEK GUIDE ME) w/Device KIT UAD 04/20/20   Marcine Matar, MD  cholecalciferol (VITAMIN D3) 25 MCG (1000 UNIT) tablet Take 1 tablet (1,000 Units total) by mouth in the morning and at bedtime. 04/03/23   Anders Simmonds, PA-C  cyclobenzaprine (FLEXERIL) 10 MG tablet Take 0.5 tablets (5 mg total) by mouth 2 (two) times daily as needed for muscle spasms. 03/08/23    Rondel Baton, MD  Elastic Bandages & Supports (MEDICAL COMPRESSION STOCKINGS) MISC Use 2 stockings daily. 04/03/23   Anders Simmonds, PA-C  ferrous sulfate 325 (65 FE) MG tablet TAKE 1 TABLET (325 MG TOTAL) BY MOUTH DAILY WITH BREAKFAST. 09/29/20 09/29/21  Marcine Matar, MD  furosemide (LASIX) 20 MG tablet Take 1/2 tablet by mouth 1-2 times a week as need for swelling in legs 07/07/23   Marcine Matar, MD  glucose blood (ACCU-CHEK GUIDE) test strip Check BS once a day 04/20/20   Marcine Matar, MD  hydrALAZINE (APRESOLINE) 100 MG tablet Take 1 tablet (100 mg total) by mouth 3 (three) times daily. 10/01/23   Marcine Matar, MD  insulin glargine (LANTUS SOLOSTAR) 100 UNIT/ML Solostar Pen Inject 20 Units into the skin daily. 10/06/23   Marcine Matar, MD  metFORMIN (GLUCOPHAGE) 500 MG tablet Take 1 tablet (500 mg total) by mouth 2 (two) times daily with a meal. 04/03/23   McClung, Marzella Schlein, PA-C  metoprolol tartrate (LOPRESSOR) 25 MG tablet Take 1 tablet (25 mg total) by mouth 2 (two) times daily. 04/03/23   Anders Simmonds, PA-C  pantoprazole (PROTONIX) 20 MG tablet Take 1 tablet (20 mg total) by mouth daily. 08/28/23   Marcine Matar, MD  PEG-KCl-NaCl-NaSulf-Na Asc-C (PLENVU) 140 g SOLR Take 140 g by mouth as directed. 01/03/21   Meredith Pel, NP  rosuvastatin (CRESTOR) 10 MG tablet Take 1 tablet (  10 mg total) by mouth daily. 04/03/23   Anders Simmonds, PA-C      Allergies    Patient has no known allergies.    Review of Systems   Review of Systems  Unable to perform ROS: Acuity of condition    Physical Exam Updated Vital Signs BP 139/71   Pulse 92   Temp (!) 97.5 F (36.4 C)   Resp 18   Wt 63.4 kg   SpO2 93%   BMI 28.23 kg/m  Physical Exam Eyes:     Pupils: Pupils are equal, round, and reactive to light.  Cardiovascular:     Rate and Rhythm: Normal rate.  Pulmonary:     Effort: Pulmonary effort is normal.     Comments: Protecting her airway Abdominal:      Palpations: Abdomen is soft.  Musculoskeletal:        General: No deformity.  Skin:    General: Skin is warm.  Neurological:     Mental Status: She is alert.     Comments: Mild facial droop, weakness in the right lower leg, more significant in the right upper, slurred speech     ED Results / Procedures / Treatments   Labs (all labs ordered are listed, but only abnormal results are displayed) Labs Reviewed  APTT - Abnormal; Notable for the following components:      Result Value   aPTT 23 (*)    All other components within normal limits  CBC - Abnormal; Notable for the following components:   Hemoglobin 11.8 (*)    All other components within normal limits  COMPREHENSIVE METABOLIC PANEL - Abnormal; Notable for the following components:   Glucose, Bld 171 (*)    Albumin 3.3 (*)    All other components within normal limits  URINALYSIS, ROUTINE W REFLEX MICROSCOPIC - Abnormal; Notable for the following components:   Color, Urine COLORLESS (*)    All other components within normal limits  I-STAT CHEM 8, ED - Abnormal; Notable for the following components:   Glucose, Bld 166 (*)    Calcium, Ion 1.11 (*)    All other components within normal limits  ETHANOL  PROTIME-INR  DIFFERENTIAL  RAPID URINE DRUG SCREEN, HOSP PERFORMED    EKG None  Radiology CT ANGIO HEAD NECK W WO CM (CODE STROKE) Result Date: 11/08/2023 CLINICAL DATA:  Neuro deficit, acute, stroke suspected EXAM: CT ANGIOGRAPHY HEAD AND NECK WITH AND WITHOUT CONTRAST TECHNIQUE: Multidetector CT imaging of the head and neck was performed using the standard protocol during bolus administration of intravenous contrast. Multiplanar CT image reconstructions and MIPs were obtained to evaluate the vascular anatomy. Carotid stenosis measurements (when applicable) are obtained utilizing NASCET criteria, using the distal internal carotid diameter as the denominator. RADIATION DOSE REDUCTION: This exam was performed according to  the departmental dose-optimization program which includes automated exposure control, adjustment of the mA and/or kV according to patient size and/or use of iterative reconstruction technique. CONTRAST:  75mL OMNIPAQUE IOHEXOL 350 MG/ML SOLN COMPARISON:  CT head from today. FINDINGS: CTA NECK FINDINGS Aortic arch: Great vessel origins are patent. Aortic atherosclerosis. Right carotid system: No evidence of dissection, stenosis (50% or greater), or occlusion. Left carotid system: Atherosclerosis the carotid bifurcation without greater than 50% stenosis. Vertebral arteries: Critical stenosis of the proximal right vertebral artery, nearly occlusive. Left vertebral artery is patent without significant stenosis. Skeleton: No evidence of acute abnormality on limited assessment. Other neck: Prominent left and right palatine tonsils. Upper chest: Visualized  lung apices are clear. Review of the MIP images confirms the above findings CTA HEAD FINDINGS Anterior circulation: Bilateral intracranial ICAs, MCAs, and ACAs are patent without proximal hemodynamically significant stenosis. Posterior circulation: Bilateral intradural vertebral arteries are patent with mild stenosis. Basilar artery and bilateral posterior cerebral arteries are patent with moderate right P2 PCA stenosis and multifocal atherosclerotic irregularity bilaterally. Venous sinuses: As permitted by contrast timing, patent. Review of the MIP images confirms the above findings IMPRESSION: 1. No emergent large vessel occlusion. 2. Critical stenosis of the proximal right vertebral artery, nearly occlusive. 3. Prominent left greater than right palatine tonsils. Recommend correlation with direct inspection to exclude malignancy. Electronically Signed   By: Feliberto Harts M.D.   On: 11/08/2023 20:39   CT HEAD CODE STROKE WO CONTRAST Result Date: 11/08/2023 CLINICAL DATA:  Code stroke.  Neuro deficit, acute, stroke suspected EXAM: CT HEAD WITHOUT CONTRAST  TECHNIQUE: Contiguous axial images were obtained from the base of the skull through the vertex without intravenous contrast. RADIATION DOSE REDUCTION: This exam was performed according to the departmental dose-optimization program which includes automated exposure control, adjustment of the mA and/or kV according to patient size and/or use of iterative reconstruction technique. COMPARISON:  None Available. FINDINGS: Brain: Small age-indeterminate right occipital infarct. Small remote infarcts in the left basal ganglia and left thalamus. Patchy white matter hypodensities, which are nonspecific but compatible with chronic microvascular ischemic disease. No evidence of acute hemorrhage, mass lesion, midline shift or hydrocephalus. Vascular: Calcific atherosclerosis.  No hyperdense vessel. Skull: No acute fracture. Sinuses/Orbits: Clear sinuses.  No acute orbital findings. ASPECTS [facility] Stroke Program Early CT Score) total score (0-10 with 10 being normal): 10. IMPRESSION: 1. Small age-indeterminate right occipital infarct. An MRI could further evaluate if clinically warranted. 2. No acute hemorrhage. 3. Chronic microvascular ischemic disease. Code stroke imaging results were communicated on 11/08/2023 at 8:10 pm to provider Dr. Otelia Limes via secure text paging. Electronically Signed   By: Feliberto Harts M.D.   On: 11/08/2023 20:10    Procedures .Critical Care  Performed by: Rozelle Logan, DO Authorized by: Rozelle Logan, DO   Critical care provider statement:    Critical care time (minutes):  30   Critical care was necessary to treat or prevent imminent or life-threatening deterioration of the following conditions:  CNS failure or compromise   Critical care was time spent personally by me on the following activities:  Development of treatment plan with patient or surrogate, discussions with consultants, evaluation of patient's response to treatment, examination of patient, ordering and review of  laboratory studies, ordering and review of radiographic studies, ordering and performing treatments and interventions, pulse oximetry, re-evaluation of patient's condition and review of old charts   I assumed direction of critical care for this patient from another provider in my specialty: no     Care discussed with: admitting provider       Medications Ordered in ED Medications  clevidipine (CLEVIPREX) infusion 0.5 mg/mL (4 mg/hr Intravenous Rate/Dose Change 11/08/23 2035)  acetaminophen (TYLENOL) tablet 650 mg (has no administration in time range)  tenecteplase (TNKASE) injection for Stroke 16 mg (16 mg Intravenous Given 11/08/23 2026)  iohexol (OMNIPAQUE) 350 MG/ML injection 75 mL (75 mLs Intravenous Contrast Given 11/08/23 2023)    ED Course/ Medical Decision Making/ A&P  Medical Decision Making Amount and/or Complexity of Data Reviewed Labs: ordered. Radiology: ordered.  Risk OTC drugs. Decision regarding hospitalization.   89 year old female presents emergency department as a code stroke.  History and exam limited secondary to acuity.  Patient evaluated with stroke neurology team.  Elevated NIH secondary to facial droop, right-sided weakness, speech change.  Initial CT head imaging is reassuring, CT angio ordered and decision made to give the patient TNK for stroke symptoms.  Cleviprex ordered for blood pressure control, heart rate is normal.  After administration of TNK patient's stroke symptoms are slightly improving, she is more interactive and stronger on her right side.  Patient will be admitted to the neuro ICU for acute stroke status post TNK.  Family at bedside updated.  Patients evaluation and results requires admission for further treatment and care.  Spoke with hospitalist, reviewed patient's ED course and they accept admission.  Patient agrees with admission plan, offers no new complaints and is stable/unchanged at time of  admit.        Final Clinical Impression(s) / ED Diagnoses Final diagnoses:  Cerebrovascular accident (CVA), unspecified mechanism [facility])    Rx / DC Orders ED Discharge Orders     None         Rozelle Logan, DO 11/08/23 2306

## 2023-11-09 ENCOUNTER — Inpatient Hospital Stay (HOSPITAL_COMMUNITY)

## 2023-11-09 DIAGNOSIS — I6389 Other cerebral infarction: Secondary | ICD-10-CM | POA: Diagnosis not present

## 2023-11-09 DIAGNOSIS — I63211 Cerebral infarction due to unspecified occlusion or stenosis of right vertebral arteries: Secondary | ICD-10-CM | POA: Diagnosis not present

## 2023-11-09 DIAGNOSIS — I10 Essential (primary) hypertension: Secondary | ICD-10-CM | POA: Diagnosis not present

## 2023-11-09 DIAGNOSIS — R29702 NIHSS score 2: Secondary | ICD-10-CM

## 2023-11-09 DIAGNOSIS — E785 Hyperlipidemia, unspecified: Secondary | ICD-10-CM

## 2023-11-09 DIAGNOSIS — F0390 Unspecified dementia without behavioral disturbance: Secondary | ICD-10-CM

## 2023-11-09 LAB — ECHOCARDIOGRAM COMPLETE
AR max vel: 1.83 cm2
AV Area VTI: 1.69 cm2
AV Area mean vel: 1.86 cm2
AV Mean grad: 4.1 mmHg
AV Peak grad: 8.1 mmHg
Ao pk vel: 1.42 m/s
Area-P 1/2: 4.49 cm2
S' Lateral: 2.4 cm
Weight: 2236.35 [oz_av]

## 2023-11-09 LAB — GLUCOSE, CAPILLARY
Glucose-Capillary: 179 mg/dL — ABNORMAL HIGH (ref 70–99)
Glucose-Capillary: 100 mg/dL — ABNORMAL HIGH (ref 70–99)
Glucose-Capillary: 39 mg/dL — CL (ref 70–99)
Glucose-Capillary: 148 mg/dL — ABNORMAL HIGH (ref 70–99)
Glucose-Capillary: 117 mg/dL — ABNORMAL HIGH (ref 70–99)
Glucose-Capillary: 258 mg/dL — ABNORMAL HIGH (ref 70–99)

## 2023-11-09 LAB — HEMOGLOBIN A1C
Hgb A1c MFr Bld: 7.7 % — ABNORMAL HIGH (ref 4.8–5.6)
Mean Plasma Glucose: 174.29 mg/dL

## 2023-11-09 LAB — LIPID PANEL
Cholesterol: 130 mg/dL (ref 0–200)
HDL: 73 mg/dL (ref >40)
LDL Cholesterol: 46 mg/dL (ref 0–99)
Total CHOL/HDL Ratio: 1.8 ratio
Triglycerides: 57 mg/dL (ref <150)
VLDL: 11 mg/dL (ref 0–40)

## 2023-11-09 LAB — MRSA NEXT GEN BY PCR, NASAL: MRSA by PCR Next Gen: NOT DETECTED

## 2023-11-09 MED ORDER — PANTOPRAZOLE SODIUM 40 MG PO TBEC
40.0000 mg | DELAYED_RELEASE_TABLET | Freq: Every day | ORAL | Status: DC
  Administered 2023-11-09 – 2023-11-12 (×4): 40 mg via ORAL
  Filled 2023-11-09 (×4): qty 1

## 2023-11-09 MED ORDER — ALBUTEROL SULFATE (2.5 MG/3ML) 0.083% IN NEBU: 2.5000 mg | INHALATION_SOLUTION | Freq: Four times a day (QID) | RESPIRATORY_TRACT | Status: DC | PRN

## 2023-11-09 NOTE — Progress Notes (Signed)
  Echocardiogram 2D Echocardiogram has been performed.  Delcie Roch 11/09/2023, 4:17 PM

## 2023-11-09 NOTE — Progress Notes (Signed)
 Pt w/ hx of asthma. Daughter at bedside states pt uses albuterol mdi at home when needed. Change albuterol neb to q6prn per rt assessment

## 2023-11-09 NOTE — Progress Notes (Addendum)
 STROKE TEAM PROGRESS NOTE   INTERIM HISTORY/SUBJECTIVE Julie Colon is at the bedside. They don't usually leave her alone but she does cook occasionally, gets dressed and uses bathroom independently. Neurologically improved almost back to baseline per Julie Colon., hemodynamically stable.  MRI tonight  OBJECTIVE  CBC    Component Value Date/Time   WBC 9.1 11/08/2023 1954   RBC 3.96 11/08/2023 1954   HGB 12.6 11/08/2023 2001   HGB 12.9 10/06/2023 1152   HCT 37.0 11/08/2023 2001   HCT 38.2 10/06/2023 1152   PLT 260 11/08/2023 1954   PLT 366 10/06/2023 1152   MCV 91.7 11/08/2023 1954   MCV 90 10/06/2023 1152   MCH 29.8 11/08/2023 1954   MCHC 32.5 11/08/2023 1954   RDW 13.7 11/08/2023 1954   RDW 11.9 10/06/2023 1152   LYMPHSABS 2.6 11/08/2023 1954   LYMPHSABS 3.0 03/20/2020 1135   MONOABS 0.7 11/08/2023 1954   EOSABS 0.2 11/08/2023 1954   EOSABS 0.3 03/20/2020 1135   BASOSABS 0.0 11/08/2023 1954   BASOSABS 0.0 03/20/2020 1135    BMET    Component Value Date/Time   NA 137 11/08/2023 2001   NA 137 10/06/2023 1152   K 4.5 11/08/2023 2001   CL 103 11/08/2023 2001   CO2 27 11/08/2023 1954   GLUCOSE 166 (H) 11/08/2023 2001   BUN 19 11/08/2023 2001   BUN 19 10/06/2023 1152   CREATININE 0.80 11/08/2023 2001   CREATININE 1.12 (H) 04/09/2022 1422   CALCIUM 9.5 11/08/2023 1954   EGFR 66 10/06/2023 1152   GFRNONAA >60 11/08/2023 1954   GFRNONAA 48 (L) 04/09/2022 1422    IMAGING past 24 hours CT ANGIO HEAD NECK W WO CM (CODE STROKE) Result Date: 11/08/2023 CLINICAL DATA:  Neuro deficit, acute, stroke suspected EXAM: CT ANGIOGRAPHY HEAD AND NECK WITH AND WITHOUT CONTRAST TECHNIQUE: Multidetector CT imaging of the head and neck was performed using the standard protocol during bolus administration of intravenous contrast. Multiplanar CT image reconstructions and MIPs were obtained to evaluate the vascular anatomy. Carotid stenosis measurements (when applicable) are obtained utilizing  NASCET criteria, using the distal internal carotid diameter as the denominator. RADIATION DOSE REDUCTION: This exam was performed according to the departmental dose-optimization program which includes automated exposure control, adjustment of the mA and/or kV according to patient size and/or use of iterative reconstruction technique. CONTRAST:  75mL OMNIPAQUE IOHEXOL 350 MG/ML SOLN COMPARISON:  CT head from today. FINDINGS: CTA NECK FINDINGS Aortic arch: Great vessel origins are patent. Aortic atherosclerosis. Right carotid system: No evidence of dissection, stenosis (50% or greater), or occlusion. Left carotid system: Atherosclerosis the carotid bifurcation without greater than 50% stenosis. Vertebral arteries: Critical stenosis of the proximal right vertebral artery, nearly occlusive. Left vertebral artery is patent without significant stenosis. Skeleton: No evidence of acute abnormality on limited assessment. Other neck: Prominent left and right palatine tonsils. Upper chest: Visualized lung apices are clear. Review of the MIP images confirms the above findings CTA HEAD FINDINGS Anterior circulation: Bilateral intracranial ICAs, MCAs, and ACAs are patent without proximal hemodynamically significant stenosis. Posterior circulation: Bilateral intradural vertebral arteries are patent with mild stenosis. Basilar artery and bilateral posterior cerebral arteries are patent with moderate right P2 PCA stenosis and multifocal atherosclerotic irregularity bilaterally. Venous sinuses: As permitted by contrast timing, patent. Review of the MIP images confirms the above findings IMPRESSION: 1. No emergent large vessel occlusion. 2. Critical stenosis of the proximal right vertebral artery, nearly occlusive. 3. Prominent left greater than right palatine tonsils. Recommend correlation  with direct inspection to exclude malignancy. Electronically Signed   By: Feliberto Harts M.D.   On: 11/08/2023 20:39   CT HEAD CODE STROKE WO  CONTRAST Result Date: 11/08/2023 CLINICAL DATA:  Code stroke.  Neuro deficit, acute, stroke suspected EXAM: CT HEAD WITHOUT CONTRAST TECHNIQUE: Contiguous axial images were obtained from the base of the skull through the vertex without intravenous contrast. RADIATION DOSE REDUCTION: This exam was performed according to the departmental dose-optimization program which includes automated exposure control, adjustment of the mA and/or kV according to patient size and/or use of iterative reconstruction technique. COMPARISON:  None Available. FINDINGS: Brain: Small age-indeterminate right occipital infarct. Small remote infarcts in the left basal ganglia and left thalamus. Patchy white matter hypodensities, which are nonspecific but compatible with chronic microvascular ischemic disease. No evidence of acute hemorrhage, mass lesion, midline shift or hydrocephalus. Vascular: Calcific atherosclerosis.  No hyperdense vessel. Skull: No acute fracture. Sinuses/Orbits: Clear sinuses.  No acute orbital findings. ASPECTS [facility] Stroke Program Early CT Score) total score (0-10 with 10 being normal): 10. IMPRESSION: 1. Small age-indeterminate right occipital infarct. An MRI could further evaluate if clinically warranted. 2. No acute hemorrhage. 3. Chronic microvascular ischemic disease. Code stroke imaging results were communicated on 11/08/2023 at 8:10 pm to provider Dr. Otelia Limes via secure text paging. Electronically Signed   By: Feliberto Harts M.D.   On: 11/08/2023 20:10    Vitals:   11/09/23 0600 11/09/23 0630 11/09/23 0700 11/09/23 0800  BP: (!) 159/70 (!) 162/74 134/61 (!) 162/83  Pulse: 83 89 84 94  Resp: 15 17 12  (!) 22  Temp:    97.9 F (36.6 C)  TempSrc:    Oral  SpO2: 94% 94% 91% 95%  Weight:         PHYSICAL EXAM General:  Alert, well-nourished, well-developed patient in no acute distress Psych:  Mood and affect appropriate for situation CV: Regular rate and rhythm on monitor Respiratory:  Regular,  unlabored respirations on room air GI: Abdomen soft and nontender   NEURO:  Mental Status: AA&Ox3, patient is able to give clear and coherent history Speech/Language: speech is without dysarthria or aphasia.  Naming, repetition, fluency, and comprehension intact.  Cranial Nerves:  II: PERRL. Visual fields full.  III, IV, VI: EOMI. Eyelids elevate symmetrically.  V: Sensation is intact to light touch and symmetrical to face.  VII: Face is symmetrical resting and smiling VIII: hearing intact to voice. IX, X: Palate elevates symmetrically. Phonation is normal.  ZO:[id number] shrug 5/5. XII: tongue is midline without fasciculations. Motor: full strength in bilateral  Weakness in bilateral lower extremities  Tone: is normal and bulk is normal Sensation- Intact to light touch bilaterally. Extinction absent to light touch to DSS.   Coordination: FTN intact bilaterally, HKS: no ataxia in BLE.No drift.  Gait- deferred  Most Recent NIH 2     ASSESSMENT/PLAN  [name] is a 89 y.o. female with history of asthma, dementia, DM, GERD, HTN and rectal prolapse who presents from home to the ED via EMS as a Code Stroke for acute onset of aphasia, right facial droop and right sided weakness.  NIH on Admission 23  Acute Ischemic Infarct: Suspect left hemispheric subcortical or brainstem infarct  Etiology: Severe proximal right vertebral artery stenosis, work up underway Code Stroke CT head Small age-indeterminate right occipital infarct  CTA head & neck No emergent large vessel occlusion. Critical stenosis of the proximal right vertebral artery, nearly occlusive. MRI  Pending  2D Echo  Pending  LDL 37 HgbA1c 7.1 VTE prophylaxis - SCDs aspirin 81 mg daily prior to admission, now on No antithrombotic until 24 hours post TNK Therapy recommendations:  Pending Disposition:  pending   Hypertension Home meds:  norvasc, hydralazine, lopressor, lasix Stable Blood Pressure Goal: BP less than  180/105   Hyperlipidemia Home meds:  Crestor, resumed in hospital LDL 46, goal < 70 Continue statin at discharge  Diabetes type II Controlled Home meds:  Metformin  HgbA1c 7.7, goal < 7.0 CBGs SSI Recommend close follow-up with PCP for better DM control  Other Stroke Risk Factors Obesity, Body mass index is 28.23 kg/m., BMI >/= 30 associated with increased stroke risk, recommend weight loss, diet and exercise as appropriate   Asthma Dulera inhaler  Dementia Lives with Julie Colon and is not typically left alone No medications   Hospital day # 1  Patient seen and examined by NP/APP with MD. MD to update note as needed.   Elmer Picker, DNP, FNP-BC [facility] Pager: [phone]  I have personally obtained history,examined this patient, reviewed notes, independently viewed imaging studies, participated in medical decision making and plan of care.ROS completed by me personally and pertinent positives fully documented  I have made any additions or clarifications directly to the above note. Agree with note above.  Patient presented with sudden onset of speech difficulties and right-sided weakness due to suspected left subcortical or brainstem infarct and received IV TNK and has made near complete recovery.  Patient speaks only [place] and Julie Colon translates for her.  Patient has no focal motor deficits on exam and cognitive testing is limited due to language barrier and baseline dementia..  Continue close neurological monitoring and strict blood pressure control as per post TNK protocol.  Mobilize out of bed to get therapy consults.  Check MRI scan of the brain later today.  Hold antiplatelet for 24 hours post TNK.  Long discussion patient and Julie Colon at the bedside and answered questions. This patient is critically ill and at significant risk of neurological worsening, death and care requires constant monitoring of vital signs, hemodynamics,respiratory and cardiac  monitoring, extensive review of multiple databases, frequent neurological assessment, discussion with family, other specialists and medical decision making of high complexity.I have made any additions or clarifications directly to the above note.This critical care time does not reflect procedure time, or teaching time or supervisory time of PA/NP/Med Resident etc but could involve care discussion time.  I spent 30 minutes of neurocritical care time  in the care of  this patient.     Delia Heady, MD Medical Director [facility] St[facility] Pager: [phone] 11/09/2023 4:55 PM   To contact Stroke Continuity provider, please refer to [email or web address]. After hours, contact General Neurology

## 2023-11-09 NOTE — Progress Notes (Signed)
 PT Cancellation Note  Patient Details Name: Julie Colon MRN: [id number] DOB: November 14, 1933   Cancelled Treatment:    Reason Eval/Treat Not Completed: Active bedrest order; will follow up when activity orders upgraded.    Elray Mcgregor 11/09/2023, 9:09 AM Sheran Lawless, PT Acute Rehabilitation Services Office:[phone] 11/09/2023

## 2023-11-09 NOTE — Progress Notes (Signed)
 OT Cancellation Note  Patient Details Name: Julie Colon MRN: [id number] DOB: [birth year]   Cancelled Treatment:    Reason Eval/Treat Not Completed: Active bedrest order (TNK)  Mateo Flow 11/09/2023, 7:48 AM

## 2023-11-10 ENCOUNTER — Other Ambulatory Visit: Payer: Self-pay

## 2023-11-10 DIAGNOSIS — I10 Essential (primary) hypertension: Secondary | ICD-10-CM | POA: Diagnosis not present

## 2023-11-10 DIAGNOSIS — R29702 NIHSS score 2: Secondary | ICD-10-CM | POA: Diagnosis not present

## 2023-11-10 DIAGNOSIS — E785 Hyperlipidemia, unspecified: Secondary | ICD-10-CM | POA: Diagnosis not present

## 2023-11-10 DIAGNOSIS — I63211 Cerebral infarction due to unspecified occlusion or stenosis of right vertebral arteries: Secondary | ICD-10-CM | POA: Diagnosis not present

## 2023-11-10 LAB — GLUCOSE, CAPILLARY
Glucose-Capillary: 130 mg/dL — ABNORMAL HIGH (ref 70–99)
Glucose-Capillary: 172 mg/dL — ABNORMAL HIGH (ref 70–99)
Glucose-Capillary: 172 mg/dL — ABNORMAL HIGH (ref 70–99)

## 2023-11-10 MED ORDER — METOPROLOL TARTRATE 25 MG PO TABS
25.0000 mg | ORAL_TABLET | Freq: Two times a day (BID) | ORAL | Status: DC
  Administered 2023-11-10 – 2023-11-13 (×7): 25 mg via ORAL
  Filled 2023-11-10 (×7): qty 1

## 2023-11-10 MED ORDER — ASPIRIN 81 MG PO TBEC
81.0000 mg | DELAYED_RELEASE_TABLET | Freq: Every day | ORAL | Status: DC
  Administered 2023-11-10 – 2023-11-13 (×4): 81 mg via ORAL
  Filled 2023-11-10 (×4): qty 1

## 2023-11-10 MED ORDER — HYDRALAZINE HCL 50 MG PO TABS
100.0000 mg | ORAL_TABLET | Freq: Three times a day (TID) | ORAL | Status: DC
  Administered 2023-11-10 – 2023-11-13 (×10): 100 mg via ORAL
  Filled 2023-11-10 (×10): qty 2

## 2023-11-10 NOTE — Progress Notes (Addendum)

## 2023-11-10 NOTE — Progress Notes (Addendum)
 STROKE TEAM PROGRESS NOTE   INTERIM HISTORY/SUBJECTIVE Patient remains hemodynamically stable and afebrile and is ready to transfer out of the ICU today.  PT/OT are recommending CIR. She still has mild right leg weakness.  MRI scan shows left basal ganglia infarct with some petechial hemorrhage.  Blood pressure adequately controlled.  Restart aspirin but hold Plavix due to hemorrhagic transformation. OBJECTIVE  CBC    Component Value Date/Time   WBC 9.1 11/08/2023 1954   RBC 3.96 11/08/2023 1954   HGB 12.6 11/08/2023 2001   HGB 12.9 10/06/2023 1152   HCT 37.0 11/08/2023 2001   HCT 38.2 10/06/2023 1152   PLT 260 11/08/2023 1954   PLT 366 10/06/2023 1152   MCV 91.7 11/08/2023 1954   MCV 90 10/06/2023 1152   MCH 29.8 11/08/2023 1954   MCHC 32.5 11/08/2023 1954   RDW 13.7 11/08/2023 1954   RDW 11.9 10/06/2023 1152   LYMPHSABS 2.6 11/08/2023 1954   LYMPHSABS 3.0 03/20/2020 1135   MONOABS 0.7 11/08/2023 1954   EOSABS 0.2 11/08/2023 1954   EOSABS 0.3 03/20/2020 1135   BASOSABS 0.0 11/08/2023 1954   BASOSABS 0.0 03/20/2020 1135    BMET    Component Value Date/Time   NA 137 11/08/2023 2001   NA 137 10/06/2023 1152   K 4.5 11/08/2023 2001   CL 103 11/08/2023 2001   CO2 27 11/08/2023 1954   GLUCOSE 166 (H) 11/08/2023 2001   BUN 19 11/08/2023 2001   BUN 19 10/06/2023 1152   CREATININE 0.80 11/08/2023 2001   CREATININE 1.12 (H) 04/09/2022 1422   CALCIUM 9.5 11/08/2023 1954   EGFR 66 10/06/2023 1152   GFRNONAA >60 11/08/2023 1954   GFRNONAA 48 (L) 04/09/2022 1422    IMAGING past 24 hours MR BRAIN WO CONTRAST Result Date: 11/09/2023 CLINICAL DATA:  Stroke, follow up EXAM: MRI HEAD WITHOUT CONTRAST TECHNIQUE: Multiplanar, multiecho pulse sequences of the brain and surrounding structures were obtained without intravenous contrast. COMPARISON:  CT head November 08, 2023. FINDINGS: Brain: Acute perforator infarcts in the left basal ganglia. Associated susceptibility artifact and mild T2  hypointensity, compatible with hemorrhage. No midline shift or substantial mass effect. No hydrocephalus or visible mass lesion. Patchy T2/FLAIR hyperintensity white matter compatible with chronic microvascular ischemic disease. Cerebral atrophy. Remote perforator infarct in the left thalamus. Vascular: Major arterial flow voids are maintained at the skull base. Skull and upper cervical spine: Normal marrow signal. Sinuses/Orbits: Clear sinuses.  No acute orbital findings. Other: Bilateral mastoid effusions. IMPRESSION: Acute perforator infarcts in the left basal ganglia. Associated hemorrhage most likely is petechial, but recommend noncontrast head CT to further characterize and help exclude a mass occupying hemorrhagic transformation. These results will be called to the ordering clinician or representative by the Radiologist Assistant, and communication documented in the PACS or [employer]. Electronically Signed   By: Feliberto Harts M.D.   On: 11/09/2023 23:40   ECHOCARDIOGRAM COMPLETE Result Date: 11/09/2023    ECHOCARDIOGRAM REPORT   Patient Name:   Julie Colon Date of Exam: 11/09/2023 Medical Rec #:  [id number]      Height:       59.0 in Accession #:    [id number]     Weight:       139.8 lb Date of Birth:  1934-06-23      BSA:          1.584 m Patient Age:    89 years       BP:  157/75 mmHg Patient Gender: F              HR:           84 bpm. Exam Location:  Inpatient Procedure: 2D Echo (Both Spectral and Color Flow Doppler were utilized during            procedure). Indications:    stroke  History:        Patient has no prior history of Echocardiogram examinations.                 Risk Factors:Diabetes and Hypertension.  Sonographer:    Delcie Roch RDCS Referring Phys: [phone] ERIC LINDZEN IMPRESSIONS  1. Left ventricular ejection fraction, by estimation, is 60 to 65%. The left ventricle has normal function. The left ventricle has no regional wall motion abnormalities. There is mild left  ventricular hypertrophy. Left ventricular diastolic parameters are consistent with Grade I diastolic dysfunction (impaired relaxation).  2. Right ventricular systolic function is normal. The right ventricular size is normal.  3. The mitral valve is normal in structure. No evidence of mitral valve regurgitation. No evidence of mitral stenosis.  4. The aortic valve is tricuspid. Aortic valve regurgitation is not visualized. No aortic stenosis is present.  5. The inferior vena cava is normal in size with greater than 50% respiratory variability, suggesting right atrial pressure of 3 mmHg. FINDINGS  Left Ventricle: Left ventricular ejection fraction, by estimation, is 60 to 65%. The left ventricle has normal function. The left ventricle has no regional wall motion abnormalities. The left ventricular internal cavity size was normal in size. There is  mild left ventricular hypertrophy. Left ventricular diastolic parameters are consistent with Grade I diastolic dysfunction (impaired relaxation). Right Ventricle: The right ventricular size is normal. Right vetricular wall thickness was not well visualized. Right ventricular systolic function is normal. Left Atrium: Left atrial size was normal in size. Right Atrium: Right atrial size was normal in size. Pericardium: There is no evidence of pericardial effusion. Mitral Valve: The mitral valve is normal in structure. No evidence of mitral valve regurgitation. No evidence of mitral valve stenosis. Tricuspid Valve: The tricuspid valve is normal in structure. Tricuspid valve regurgitation is not demonstrated. No evidence of tricuspid stenosis. Aortic Valve: The aortic valve is tricuspid. Aortic valve regurgitation is not visualized. No aortic stenosis is present. Aortic valve mean gradient measures 4.1 mmHg. Aortic valve peak gradient measures 8.1 mmHg. Aortic valve area, by VTI measures 1.69 cm. Pulmonic Valve: The pulmonic valve was not well visualized. Pulmonic valve  regurgitation is not visualized. No evidence of pulmonic stenosis. Aorta: The aortic root and ascending aorta are structurally normal, with no evidence of dilitation. Venous: The inferior vena cava is normal in size with greater than 50% respiratory variability, suggesting right atrial pressure of 3 mmHg. IAS/Shunts: No atrial level shunt detected by color flow Doppler.  LEFT VENTRICLE PLAX 2D LVIDd:         3.70 cm   Diastology LVIDs:         2.40 cm   LV e' medial:    5.77 cm/s LV PW:         1.00 cm   LV E/e' medial:  18.9 LV IVS:        1.20 cm   LV e' lateral:   6.64 cm/s LVOT diam:     2.00 cm   LV E/e' lateral: 16.4 LV SV:         52 LV SV Index:  33 LVOT Area:     3.14 cm  RIGHT VENTRICLE             IVC RV Basal diam:  2.30 cm     IVC diam: 1.70 cm RV S prime:     13.80 cm/s TAPSE (M-mode): 1.7 cm LEFT ATRIUM             Index        RIGHT ATRIUM           Index LA diam:        3.30 cm 2.08 cm/m   RA Area:     10.30 cm LA Vol (A2C):   31.3 ml 19.76 ml/m  RA Volume:   20.00 ml  12.63 ml/m LA Vol (A4C):   30.9 ml 19.51 ml/m LA Biplane Vol: 32.0 ml 20.20 ml/m  AORTIC VALVE AV Area (Vmax):    1.83 cm AV Area (Vmean):   1.86 cm AV Area (VTI):     1.69 cm AV Vmax:           142.09 cm/s AV Vmean:          95.420 cm/s AV VTI:            0.309 m AV Peak Grad:      8.1 mmHg AV Mean Grad:      4.1 mmHg LVOT Vmax:         82.60 cm/s LVOT Vmean:        56.500 cm/s LVOT VTI:          0.166 m LVOT/AV VTI ratio: 0.54  AORTA Ao Root diam: 2.90 cm Ao Asc diam:  3.30 cm MITRAL VALVE MV Area (PHT): 4.49 cm     SHUNTS MV Decel Time: 169 msec     Systemic VTI:  0.17 m MV E velocity: 109.00 cm/s  Systemic Diam: 2.00 cm MV A velocity: 129.00 cm/s MV E/A ratio:  0.84 Dina Rich MD Electronically signed by Dina Rich MD Signature Date/Time: 11/09/2023/4:45:26 PM    Final     Vitals:   11/10/23 1000 11/10/23 1100 11/10/23 1200 11/10/23 1300  BP: (!) 150/84 (!) 155/90 (!) 151/92 112/65  Pulse: (!) 110 (!) 102  71   Resp: 16 20  18   Temp:   98.1 F (36.7 C)   TempSrc:   Oral   SpO2: 96% 94% 95% 98%  Weight:         PHYSICAL EXAM General:  Alert, well-nourished, well-developed pleasant elderly lady in no acute distress Psych:  Mood and affect appropriate for situation CV: Regular rate and rhythm on monitor Respiratory:  Regular, unlabored respirations on room air GI: Abdomen soft and nontender   NEURO:  Mental Status: AA&Ox3, patient is able to give some history Speech/Language: speech is without dysarthria or aphasia.    Cranial Nerves:  II: PERRL. Visual fields full.  III, IV, VI: EOMI. Eyelids elevate symmetrically.  V: Sensation is intact to light touch and symmetrical to face.  VII: Face is symmetrical resting and smiling VIII: hearing intact to voice. IX, X: Phonation is normal.  XII: tongue is midline without fasciculations. Motor: full strength in bilateral upper extremities but diminished fine finger movements on the right Weakness in right lower extremity with drift.   Tone: is normal and bulk is normal Sensation- Intact to light touch bilaterally.   Coordination: FTN intact bilaterally Gait- deferred  Most Recent NIH 2     ASSESSMENT/PLAN  Julie Colon is a  89 y.o. female with history of asthma, dementia, DM, GERD, HTN and rectal prolapse who presents from home to the ED via EMS as a Code Stroke for acute onset of aphasia, right facial droop and right sided weakness.  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 Critical stenosis of the proximal right vertebral artery, nearly occlusive. MRI acute perforator infarcts in left basal ganglia with petechial hemorrhage 2D Echo EF 60-65%, grade 1 diastolic dysfunction, normal left atrial size, no atrial level  shunt LDL 37 HgbA1c 7.1 VTE prophylaxis - SCDs aspirin 81 mg daily prior to admission, now on aspirin 81 mg daily alone due to petechial hemorrhagic transformation Therapy recommendations:  CIR Disposition: Transfer out of ICU today  Hypertension Home meds:  norvasc, hydralazine, lopressor, lasix Stable Blood Pressure Goal: BP less than 180/105   Hyperlipidemia Home meds:  Crestor, resumed in hospital LDL 46, goal < 70 Continue statin at discharge  Diabetes type II Controlled Home meds:  Metformin  HgbA1c 7.7, goal < 7.0 CBGs SSI Recommend close follow-up with PCP for better DM control  Other Stroke Risk Factors Obesity, Body mass index is 28.23 kg/m., BMI >/= 30 associated with increased stroke risk, recommend weight loss, diet and exercise as appropriate   Asthma Dulera inhaler  Dementia Lives with daughter and is not typically left alone No medications   Hospital day # 2  Patient seen and examined by NP/APP with MD. MD to update note as needed.   Cortney E Ernestina Columbia , MSN, AGACNP-BC [facility] See Amion for schedule and pager information 11/10/2023 2:35 PM   I have personally obtained history,examined this patient, reviewed notes, independently viewed imaging studies, participated in medical decision making and plan of care.ROS completed by me personally and pertinent positives fully documented  I have made any additions or clarifications directly to the above note. Agree with note above.  Patient is neurologically stable but still has some mild right leg weakness and MRI scan shows a left basal ganglia infarct with some petechial hemorrhage transformation.  Continue aspirin alone due to hemorrhagic transformation.  Mobilize out of bed.  Physical occupational therapy consults.  May need inpatient rehab.  Transfer to neurology floor bed later today when available.  No family at the bedside.  Greater than 50% time during this 50-minute visit was spent in  counseling and coordination of care and discussion patient and care team answering questions.  Delia Heady, MD Medical Director [facility] Stroke [facility] Pager: [phone] 11/10/2023 3:19 PM   To contact Stroke Continuity provider, please refer to [email or web address]. After hours, contact General Neurology

## 2023-11-10 NOTE — Discharge Summary (Shared)
 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 s/p iv thrombolysis with TNK  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   Essential hypertension 03/20/2020   Hyponatremia 03/02/2020   Sepsis due to pneumonia (HCC) 03/02/2020   Noncompliance 03/02/2020   Leukocytosis 03/02/2020   Advanced age 77/09[birth year]2019   Pneumonia 03/02/2020   Hyperglycemic hyperosmolar nonketotic coma (HCC) 01/14/2020   Type 2 diabetes mellitus with complication, without long-term current use of insulin (HCC) 01/14/2020   Hyperkalemia 01/14/2020   AKI (acute kidney injury) (HCC) 01/14/2020     Secondary Diagnoses: Hypertension Hyperlipidemia Diabetes Type II  Allergies as of 11/13/2023   No Known Allergies      Medication List     TAKE these medications    acetaminophen 325 MG tablet Commonly known as: TYLENOL Take 650 mg by mouth every 8 (eight) hours.   amLODipine 10 MG tablet Commonly known as: NORVASC Take 1 tablet (10 mg total) by mouth daily.   aspirin EC 81 MG tablet Take 1 tablet (81 mg total) by mouth daily. Swallow whole. What changed:  when to take this reasons to take this   furosemide 20 MG tablet Commonly known as: LASIX Take 1/2 tablet by mouth 1-2 times a week as need for swelling in legs   hydrALAZINE 100 MG tablet Commonly known as: APRESOLINE Take 1 tablet (100 mg total) by mouth 3 (three) times daily.   Lantus SoloStar 100 UNIT/ML Solostar  Pen Generic drug: insulin glargine Inject 20 Units into the skin daily.   metFORMIN 500 MG tablet Commonly known as: GLUCOPHAGE Take 1 tablet (500 mg total) by mouth 2 (two) times daily with a meal.   metoprolol tartrate 25 MG tablet Commonly known as: LOPRESSOR Take 1 tablet (25 mg total) by mouth 2 (two) times daily.   mometasone-formoterol 100-5 MCG/ACT Aero Commonly known as: DULERA Inhale 2 puffs into the lungs daily.   pantoprazole 20 MG tablet Commonly known as: Protonix Prendre 1 comprim (20 mg au total) par voie orale par jour. (Take 1 tablet (20 mg total) by mouth daily.)   REFRESH PLUS OP Apply to eye.   rosuvastatin 10 MG tablet Commonly known as: Crestor Take 1 tablet (10 mg total) by mouth daily.        LABORATORY STUDIES CBC    Component Value Date/Time   WBC 7.5 11/11/2023 1040   RBC 4.08 11/11/2023 1040   HGB 12.3 11/11/2023 1040   HGB 12.9 10/06/2023 1152   HCT 37.1 11/11/2023 1040   HCT 38.2 10/06/2023 1152   PLT 267 11/11/2023 1040   PLT 366 10/06/2023 1152   MCV 90.9 11/11/2023 1040   MCV 90 10/06/2023 1152   MCH 30.1 11/11/2023 1040   MCHC 33.2 11/11/2023 1040   RDW 13.9 11/11/2023 1040   RDW 11.9 10/06/2023 1152   LYMPHSABS 2.6 11/08/2023 1954   LYMPHSABS 3.0 03/20/2020 1135   MONOABS 0.7 11/08/2023 1954   EOSABS 0.2 11/08/2023  1954   EOSABS 0.3 03/20/2020 1135   BASOSABS 0.0 11/08/2023 1954   BASOSABS 0.0 03/20/2020 1135   CMP    Component Value Date/Time   NA 136 11/11/2023 0906   NA 137 10/06/2023 1152   K 4.3 11/11/2023 0906   CL 103 11/11/2023 0906   CO2 24 11/11/2023 0906   GLUCOSE 89 11/11/2023 0906   BUN 15 11/11/2023 0906   BUN 19 10/06/2023 1152   CREATININE 0.94 11/11/2023 0906   CREATININE 1.12 (H) 04/09/2022 1422   CALCIUM 9.4 11/11/2023 0906   PROT 7.0 11/08/2023 1954   PROT 7.3 07/07/2023 1229   ALBUMIN 3.3 (L) 11/08/2023 1954   ALBUMIN 4.2 07/07/2023 1229   AST 18 11/08/2023 1954   AST 15 04/09/2022  1422   ALT 11 11/08/2023 1954   ALT 10 04/09/2022 1422   ALKPHOS 54 11/08/2023 1954   BILITOT 0.5 11/08/2023 1954   BILITOT 0.3 07/07/2023 1229   BILITOT 0.3 04/09/2022 1422   GFRNONAA 59 (L) 11/11/2023 0906   GFRNONAA 48 (L) 04/09/2022 1422   GFRAA 87 09/29/2020 1140   COAGS Lab Results  Component Value Date   INR 0.9 11/08/2023   Lipid Panel    Component Value Date/Time   CHOL 130 11/09/2023 0942   CHOL 136 07/07/2023 1229   TRIG 57 11/09/2023 0942   HDL 73 11/09/2023 0942   HDL 82 07/07/2023 1229   CHOLHDL 1.8 11/09/2023 0942   VLDL 11 11/09/2023 0942   LDLCALC 46 11/09/2023 0942   LDLCALC 37 07/07/2023 1229   HgbA1C  Lab Results  Component Value Date   HGBA1C 7.7 (H) 11/09/2023   Urine Drug Screen negative Alcohol Level    Component Value Date/Time   ETH <10 11/08/2023 1955     SIGNIFICANT DIAGNOSTIC STUDIES MR BRAIN WO CONTRAST Result Date: 11/09/2023 CLINICAL DATA:  Stroke, follow up EXAM: MRI HEAD WITHOUT CONTRAST TECHNIQUE: Multiplanar, multiecho pulse sequences of the brain and surrounding structures were obtained without intravenous contrast. COMPARISON:  CT head November 08, 2023. FINDINGS: Brain: Acute perforator infarcts in the left basal ganglia. Associated susceptibility artifact and mild T2 hypointensity, compatible with hemorrhage. No midline shift or substantial mass effect. No hydrocephalus or visible mass lesion. Patchy T2/FLAIR hyperintensity white matter compatible with chronic microvascular ischemic disease. Cerebral atrophy. Remote perforator infarct in the left thalamus. Vascular: Major arterial flow voids are maintained at the skull base. Skull and upper cervical spine: Normal marrow signal. Sinuses/Orbits: Clear sinuses.  No acute orbital findings. Other: Bilateral mastoid effusions. IMPRESSION: Acute perforator infarcts in the left basal ganglia. Associated hemorrhage most likely is petechial, but recommend noncontrast head CT to further characterize  and help exclude a mass occupying hemorrhagic transformation. These results will be called to the ordering clinician or representative by the Radiologist Assistant, and communication documented in the PACS or [employer]. Electronically Signed   By: Feliberto Harts M.D.   On: 11/09/2023 23:40   ECHOCARDIOGRAM COMPLETE Result Date: 11/09/2023    ECHOCARDIOGRAM REPORT   Patient Name:   Julie Colon Date of Exam: 11/09/2023 Medical Rec #:  [id number]      Height:       59.0 in Accession #:    [id number]     Weight:       139.8 lb Date of Birth:  1934/01/28      BSA:          1.584 m Patient Age:    60 years  BP:           157/75 mmHg Patient Gender: F              HR:           84 bpm. Exam Location:  Inpatient Procedure: 2D Echo (Both Spectral and Color Flow Doppler were utilized during            procedure). Indications:    stroke  History:        Patient has no prior history of Echocardiogram examinations.                 Risk Factors:Diabetes and Hypertension.  Sonographer:    Delcie Roch RDCS Referring Phys: [phone] ERIC LINDZEN IMPRESSIONS  1. Left ventricular ejection fraction, by estimation, is 60 to 65%. The left ventricle has normal function. The left ventricle has no regional wall motion abnormalities. There is mild left ventricular hypertrophy. Left ventricular diastolic parameters are consistent with Grade I diastolic dysfunction (impaired relaxation).  2. Right ventricular systolic function is normal. The right ventricular size is normal.  3. The mitral valve is normal in structure. No evidence of mitral valve regurgitation. No evidence of mitral stenosis.  4. The aortic valve is tricuspid. Aortic valve regurgitation is not visualized. No aortic stenosis is present.  5. The inferior vena cava is normal in size with greater than 50% respiratory variability, suggesting right atrial pressure of 3 mmHg. FINDINGS  Left Ventricle: Left ventricular ejection fraction, by estimation, is 60 to 65%.  The left ventricle has normal function. The left ventricle has no regional wall motion abnormalities. The left ventricular internal cavity size was normal in size. There is  mild left ventricular hypertrophy. Left ventricular diastolic parameters are consistent with Grade I diastolic dysfunction (impaired relaxation). Right Ventricle: The right ventricular size is normal. Right vetricular wall thickness was not well visualized. Right ventricular systolic function is normal. Left Atrium: Left atrial size was normal in size. Right Atrium: Right atrial size was normal in size. Pericardium: There is no evidence of pericardial effusion. Mitral Valve: The mitral valve is normal in structure. No evidence of mitral valve regurgitation. No evidence of mitral valve stenosis. Tricuspid Valve: The tricuspid valve is normal in structure. Tricuspid valve regurgitation is not demonstrated. No evidence of tricuspid stenosis. Aortic Valve: The aortic valve is tricuspid. Aortic valve regurgitation is not visualized. No aortic stenosis is present. Aortic valve mean gradient measures 4.1 mmHg. Aortic valve peak gradient measures 8.1 mmHg. Aortic valve area, by VTI measures 1.69 cm. Pulmonic Valve: The pulmonic valve was not well visualized. Pulmonic valve regurgitation is not visualized. No evidence of pulmonic stenosis. Aorta: The aortic root and ascending aorta are structurally normal, with no evidence of dilitation. Venous: The inferior vena cava is normal in size with greater than 50% respiratory variability, suggesting right atrial pressure of 3 mmHg. IAS/Shunts: No atrial level shunt detected by color flow Doppler.  LEFT VENTRICLE PLAX 2D LVIDd:         3.70 cm   Diastology LVIDs:         2.40 cm   LV e' medial:    5.77 cm/s LV PW:         1.00 cm   LV E/e' medial:  18.9 LV IVS:        1.20 cm   LV e' lateral:   6.64 cm/s LVOT diam:     2.00 cm   LV E/e' lateral: 16.4 LV SV:  52 LV SV Index:   33 LVOT Area:     3.14 cm   RIGHT VENTRICLE             IVC RV Basal diam:  2.30 cm     IVC diam: 1.70 cm RV S prime:     13.80 cm/s TAPSE (M-mode): 1.7 cm LEFT ATRIUM             Index        RIGHT ATRIUM           Index LA diam:        3.30 cm 2.08 cm/m   RA Area:     10.30 cm LA Vol (A2C):   31.3 ml 19.76 ml/m  RA Volume:   20.00 ml  12.63 ml/m LA Vol (A4C):   30.9 ml 19.51 ml/m LA Biplane Vol: 32.0 ml 20.20 ml/m  AORTIC VALVE AV Area (Vmax):    1.83 cm AV Area (Vmean):   1.86 cm AV Area (VTI):     1.69 cm AV Vmax:           142.09 cm/s AV Vmean:          95.420 cm/s AV VTI:            0.309 m AV Peak Grad:      8.1 mmHg AV Mean Grad:      4.1 mmHg LVOT Vmax:         82.60 cm/s LVOT Vmean:        56.500 cm/s LVOT VTI:          0.166 m LVOT/AV VTI ratio: 0.54  AORTA Ao Root diam: 2.90 cm Ao Asc diam:  3.30 cm MITRAL VALVE MV Area (PHT): 4.49 cm     SHUNTS MV Decel Time: 169 msec     Systemic VTI:  0.17 m MV E velocity: 109.00 cm/s  Systemic Diam: 2.00 cm MV A velocity: 129.00 cm/s MV E/A ratio:  0.84 Dina Rich MD Electronically signed by Dina Rich MD Signature Date/Time: 11/09/2023/4:45:26 PM    Final    CT ANGIO HEAD NECK W WO CM (CODE STROKE) Result Date: 11/08/2023 CLINICAL DATA:  Neuro deficit, acute, stroke suspected EXAM: CT ANGIOGRAPHY HEAD AND NECK WITH AND WITHOUT CONTRAST TECHNIQUE: Multidetector CT imaging of the head and neck was performed using the standard protocol during bolus administration of intravenous contrast. Multiplanar CT image reconstructions and MIPs were obtained to evaluate the vascular anatomy. Carotid stenosis measurements (when applicable) are obtained utilizing NASCET criteria, using the distal internal carotid diameter as the denominator. RADIATION DOSE REDUCTION: This exam was performed according to the departmental dose-optimization program which includes automated exposure control, adjustment of the mA and/or kV according to patient size and/or use of iterative reconstruction  technique. CONTRAST:  75mL OMNIPAQUE IOHEXOL 350 MG/ML SOLN COMPARISON:  CT head from today. FINDINGS: CTA NECK FINDINGS Aortic arch: Great vessel origins are patent. Aortic atherosclerosis. Right carotid system: No evidence of dissection, stenosis (50% or greater), or occlusion. Left carotid system: Atherosclerosis the carotid bifurcation without greater than 50% stenosis. Vertebral arteries: Critical stenosis of the proximal right vertebral artery, nearly occlusive. Left vertebral artery is patent without significant stenosis. Skeleton: No evidence of acute abnormality on limited assessment. Other neck: Prominent left and right palatine tonsils. Upper chest: Visualized lung apices are clear. Review of the MIP images confirms the above findings CTA HEAD FINDINGS Anterior circulation: Bilateral intracranial ICAs, MCAs, and ACAs are patent without proximal hemodynamically significant  stenosis. Posterior circulation: Bilateral intradural vertebral arteries are patent with mild stenosis. Basilar artery and bilateral posterior cerebral arteries are patent with moderate right P2 PCA stenosis and multifocal atherosclerotic irregularity bilaterally. Venous sinuses: As permitted by contrast timing, patent. Review of the MIP images confirms the above findings IMPRESSION: 1. No emergent large vessel occlusion. 2. Critical stenosis of the proximal right vertebral artery, nearly occlusive. 3. Prominent left greater than right palatine tonsils. Recommend correlation with direct inspection to exclude malignancy. Electronically Signed   By: Feliberto Harts M.D.   On: 11/08/2023 20:39   CT HEAD CODE STROKE WO CONTRAST Result Date: 11/08/2023 CLINICAL DATA:  Code stroke.  Neuro deficit, acute, stroke suspected EXAM: CT HEAD WITHOUT CONTRAST TECHNIQUE: Contiguous axial images were obtained from the base of the skull through the vertex without intravenous contrast. RADIATION DOSE REDUCTION: This exam was performed according to the  departmental dose-optimization program which includes automated exposure control, adjustment of the mA and/or kV according to patient size and/or use of iterative reconstruction technique. COMPARISON:  None Available. FINDINGS: Brain: Small age-indeterminate right occipital infarct. Small remote infarcts in the left basal ganglia and left thalamus. Patchy white matter hypodensities, which are nonspecific but compatible with chronic microvascular ischemic disease. No evidence of acute hemorrhage, mass lesion, midline shift or hydrocephalus. Vascular: Calcific atherosclerosis.  No hyperdense vessel. Skull: No acute fracture. Sinuses/Orbits: Clear sinuses.  No acute orbital findings. ASPECTS [facility] Stroke Program Early CT Score) total score (0-10 with 10 being normal): 10. IMPRESSION: 1. Small age-indeterminate right occipital infarct. An MRI could further evaluate if clinically warranted. 2. No acute hemorrhage. 3. Chronic microvascular ischemic disease. Code stroke imaging results were communicated on 11/08/2023 at 8:10 pm to provider Dr. Otelia Limes via secure text paging. Electronically Signed   By: Feliberto Harts M.D.   On: 11/08/2023 20:10       HISTORY OF PRESENT ILLNESS 89 y.o. patient with history of asthma, dementia, diabetes, GERD, hypertension and rectal prolapse was admitted with acute onset aphasia, right facial droop and right-sided weakness.  HOSPITAL COURSE Patient was given TNK to treat her stroke with resultant improvement in deficits.  She was stable and transferred out of of the ICU on 3/10, and is now ready for discharge to rehabilitation.  Acute Ischemic Infarct: Left basal ganglia likely due to small vessel disease status post TNK etiology: Small vessel disease Code Stroke CT head Small age-indeterminate right occipital infarct  CTA head & neck No emergent large vessel occlusion. Critical stenosis of the proximal right vertebral artery, nearly occlusive. MRI acute perforator infarcts  in left basal ganglia with petechial hemorrhage 2D Echo EF 60-65%, grade 1 diastolic dysfunction, normal left atrial size, no atrial level shunt LDL 37 HgbA1c 7.1 VTE prophylaxis - SCDs aspirin 81 mg daily prior to admission, now on aspirin 81 mg daily alone due to petechial hemorrhagic transformation Therapy recommendations:  Home with Home health Disposition: Home with home health    Hypertension Home meds:  norvasc, hydralazine, lopressor, lasix Stable Blood Pressure Goal: BP less than 180/105    Hyperlipidemia Home meds:  Crestor, resumed in hospital LDL 46, goal < 70 Continue statin at discharge   Diabetes type II Controlled Home meds:  Metformin  HgbA1c 7.7, goal < 7.0 CBGs SSI Recommend close follow-up with PCP for better DM control   Other Stroke Risk Factors Obesity, Body mass index is 28.23 kg/m., BMI >/= 30 associated with increased stroke risk, recommend weight loss, diet and exercise as appropriate  Asthma Dulera inhaler   Dementia Lives with daughter and is not typically left alone No medications   RN Pressure Injury Documentation:     DISCHARGE EXAM  General:  Alert, well-nourished, well-developed pleasant elderly lady in no acute distress Psych:  Mood and affect appropriate for situation CV: Regular rate and rhythm on monitor Respiratory:  Regular, unlabored respirations on room air GI: Abdomen soft and nontender     NEURO:  Mental Status: AA&Ox3, patient is able to give some history Speech/Language: speech is without dysarthria or aphasia.     Cranial Nerves:  II: PERRL. Visual fields full.  III, IV, VI: EOMI. Eyelids elevate symmetrically.  V: Sensation is intact to light touch and symmetrical to face.  VII: Face is symmetrical resting and smiling VIII: hearing intact to voice. IX, X: Phonation is normal.  XII: tongue is midline without fasciculations. Motor: full strength in bilateral upper extremities but diminished fine finger  movements on the right Weakness in right lower extremity with drift.   Tone: is normal and bulk is normal Sensation- Intact to light touch bilaterally.   Coordination: FTN intact bilaterally Gait- deferred  1a Level of Conscious.: 0 1b LOC Questions: 0 1c LOC Commands: 0 2 Best Gaze: 0 3 Visual: 0 4 Facial Palsy: 0 5a Motor Arm - left: 0 5b Motor Arm - Right: 0 6a Motor Leg - Left: 0 6b Motor Leg - Right: 1 7 Limb Ataxia: 0 8 Sensory: 0 9 Best Language: 0 10 Dysarthria: 0 11 Extinct. and Inatten.: 0 TOTAL: 1   Discharge Diet       Diet   Diet heart healthy/carb modified Room service appropriate? Yes; Fluid consistency: Thin   liquids  DISCHARGE PLAN Disposition: Home with home health aspirin 81 mg daily for secondary stroke prevention. Ongoing stroke risk factor control by Primary Care Physician at time of discharge Follow-up PCP Marcine Matar, MD in 2 weeks. Follow-up in [facility] in 8 weeks, office to schedule an appointment.   32 minutes were spent preparing discharge.  Patient seen and examined by NP/APP with MD. MD to update note as needed.   Elmer Picker, DNP, FNP-BC [facility] Pager: [phone]  I have personally obtained history,examined this patient, reviewed notes, independently viewed imaging studies, participated in medical decision making and plan of care.ROS completed by me personally and pertinent positives fully documented  I have made any additions or clarifications directly to the above note. Agree with note above.    Delia Heady, MD Medical Director [facility] Pager: [phone] 11/13/2023 4:28 PM

## 2023-11-10 NOTE — Evaluation (Signed)
 Physical Therapy Evaluation Patient Details Name: Julie Colon MRN: [id number] DOB: [birth year] Today's Date: 11/10/2023  History of Present Illness  Patient is an 89 y.o. female presenting 11/08/23 with aphasia, R facial droop, and R weakness on 11/08/2023. 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 S/p TNK PMH significant for asthma, dementia, DM, GERD, HTN, rectal prolapse.   Clinical Impression  Julie Colon is 89 y.o. female admitted with above HPI and diagnosis. Patient is currently limited by functional impairments below (see PT problem list). Patient lives with family and pt pt reports is mod ind with SPC for mobility and ADL's at baseline, no family able to be contacted or present to confirm. Currently pt requires min assist for transfers and gait with slight posterior lean and lateral drift with standing balance and gait. Pt required assist to prevent 2x LOB during short bouts of ambulation. Patient will benefit from continued skilled PT interventions to address impairments and progress independence with mobility. Patient will benefit from intensive inpatient follow-up therapy, >3 hours/day. Acute PT will follow and progress as able.         If plan is discharge home, recommend the following: A little help with walking and/or transfers;A little help with bathing/dressing/bathroom;Assistance with cooking/housework;Direct supervision/assist for medications management;Assist for transportation;Help with stairs or ramp for entrance;Supervision due to cognitive status   Can travel by private vehicle        Equipment Recommendations Rolling walker (2 wheels);Other (comment) (TBD w/ progress, defer to next venue)  Recommendations for Other Services  Rehab consult    Functional Status Assessment Patient has had a recent decline in their functional status and demonstrates the ability to make significant improvements in  function in a reasonable and predictable amount of time.     Precautions / Restrictions Precautions Precautions: Fall Recall of Precautions/Restrictions: Impaired Precaution/Restrictions Comments: BP <180/110 Restrictions Weight Bearing Restrictions Per Provider Order: No      Mobility  Bed Mobility               General bed mobility comments: at EOB with OT and RN    Transfers Overall transfer level: Needs assistance Equipment used: 1 person hand held assist Transfers: Sit to/from Stand Sit to Stand: Min assist           General transfer comment: pt preferring HHA to steady self with rise from EOB, pt with slight posterior lean on standing    Ambulation/Gait Ambulation/Gait assistance: Min assist Gait Distance (Feet): 15 Feet Assistive device: 1 person hand held assist Gait Pattern/deviations: Step-through pattern, Decreased step length - right, Decreased step length - left, Decreased stride length, Drifts right/left Gait velocity: decr     General Gait Details: HHA on Lt side, min assist to stabilize weight shift Rt/Lt for gait and turns. Pt amb in room bed>recliner and recliner>bathroom for self care and oral hygeine standing at sink. pt with 1x LOB to Rt and slight lean Lt to compensate/correct. Min assist to [employer].  Stairs            Wheelchair Mobility     Tilt Bed    Modified Rankin (Stroke Patients Only)       Balance Overall balance assessment: Needs assistance Sitting-balance support: No upper extremity supported, Feet supported Sitting balance-Leahy Scale: Good     Standing balance support: Bilateral upper extremity supported, During functional activity Standing balance-Leahy Scale: Fair Standing balance comment: pt reliant on support of counter for oral  hygeine at sink.                             Pertinent Vitals/Pain Pain Assessment Pain Assessment: Faces Faces Pain Scale: No hurt Pain Intervention(s):  Monitored during session, Repositioned    Home Living Family/patient expects to be discharged to:: Private residence Living Arrangements: Children Available Help at Discharge: Family Type of Home: House Home Access: Stairs to enter           Additional Comments: Unable to get in touch with pt's daughter to confirm PLOF or home set up    Prior Function Prior Level of Function : Independent/Modified Independent             Mobility Comments: per pt may have been using SPC for mobility PTA ADLs Comments: likely independent     Extremity/Trunk Assessment   Upper Extremity Assessment Upper Extremity Assessment: Defer to OT evaluation    Lower Extremity Assessment Lower Extremity Assessment: Generalized weakness    Cervical / Trunk Assessment Cervical / Trunk Assessment: Normal  Communication   Communication Communication: Impaired Factors Affecting Communication: Non - English speaking, interpreter not available (interpreter via phone call)    Cognition Arousal: Alert Behavior During Therapy: WFL for tasks assessed/performed   PT - Cognitive impairments: Problem solving, Safety/Judgement, Awareness, Memory, No family/caregiver present to determine baseline                         Following commands: Impaired Following commands impaired: Follows multi-step commands inconsistently     Cueing Cueing Techniques: Verbal cues, Tactile cues     General Comments      Exercises     Assessment/Plan    PT Assessment Patient needs continued PT services  PT Problem List Decreased strength;Decreased activity tolerance;Decreased balance;Decreased mobility;Decreased coordination;Decreased cognition;Decreased knowledge of use of DME;Decreased safety awareness       PT Treatment Interventions DME instruction;Gait training;Stair training;Functional mobility training;Therapeutic activities;Therapeutic exercise;Balance training;Neuromuscular re-education;Cognitive  remediation;Patient/family education;Wheelchair mobility training    PT Goals (Current goals can be found in the Care Plan section)  Acute Rehab PT Goals Patient Stated Goal: find out if family is ok and recover PT Goal Formulation: With patient Time For Goal Achievement: 11/24/23 Potential to Achieve Goals: Good    Frequency Min 1X/week     Co-evaluation               AM-PAC PT "6 Clicks" Mobility  Outcome Measure Help needed turning from your back to your side while in a flat bed without using bedrails?: A Little Help needed moving from lying on your back to sitting on the side of a flat bed without using bedrails?: A Little Help needed moving to and from a bed to a chair (including a wheelchair)?: A Little Help needed standing up from a chair using your arms (e.g., wheelchair or bedside chair)?: A Little Help needed to walk in hospital room?: A Little Help needed climbing 3-5 steps with a railing? : A Lot 6 Click Score: 17    End of Session Equipment Utilized During Treatment: Gait belt Activity Tolerance: Patient tolerated treatment well Patient left: in chair;with call bell/phone within reach;with chair alarm set Nurse Communication: Mobility status PT Visit Diagnosis: Other abnormalities of gait and mobility (R26.89);Unsteadiness on feet (R26.81);Difficulty in walking, not elsewhere classified (R26.2);Other symptoms and signs involving the nervous system (Z61.096)    Time: 0454-0981 PT Time Calculation (min) (  ACUTE ONLY): 23 min   Charges:   PT Evaluation $PT Eval Moderate Complexity: 1 Mod   PT General Charges $$ ACUTE PT VISIT: 1 Visit         Wynn Maudlin, DPT Acute Rehabilitation Services Office [phone]  11/10/23 10:10 AM

## 2023-11-10 NOTE — Progress Notes (Signed)
 Pt transferred from [facility] ICU, accompanied by family, settled in bed with call light at bedside,  safety concern explained and initiated, Tele monitor put and verified, was however reassured and will continue to monitor. Obasogie-Asidi, Taiquan Campanaro Efe

## 2023-11-10 NOTE — Evaluation (Signed)
 Occupational Therapy Evaluation Patient Details Name: Julie Colon MRN: [id number] DOB: Sep 11, 1933 Today's Date: 11/10/2023   History of Present Illness   Patient is an 89 y.o. female presenting 11/08/23 with aphasia, R facial droop, and R weakness on 11/08/2023. 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 S/p TNK PMH significant for asthma, dementia, DM, GERD, HTN, rectal prolapse.     Clinical Impressions PTA, pt lived with her children per her report; attempted to call daughter to obtain home set-up as pt with difficulty reporting but unable to reach daughter. Pt currently requiring min A for transfers and up to mod A for LB ADL. Pt needing up to min A and leaning onto her L elbow at sink to maintain balance during grooming tasks. Due to significant change in functional status, recommending intensive multidisciplinary rehabilitation >3 hours/day to optimize safety and independence in ADL.       If plan is discharge home, recommend the following:   A lot of help with walking and/or transfers;A little help with bathing/dressing/bathroom;A lot of help with bathing/dressing/bathroom;Assistance with cooking/housework;Assist for transportation;Help with stairs or ramp for entrance     Functional Status Assessment   Patient has had a recent decline in their functional status and demonstrates the ability to make significant improvements in function in a reasonable and predictable amount of time.     Equipment Recommendations   Other (comment) (defer)     Recommendations for Other Services   Rehab consult     Precautions/Restrictions   Precautions Precautions: Fall Recall of Precautions/Restrictions: Impaired Precaution/Restrictions Comments: BP <180/110 Restrictions Weight Bearing Restrictions Per Provider Order: No     Mobility Bed Mobility               General bed mobility comments: on bsc on  arrival    Transfers Overall transfer level: Needs assistance Equipment used: 1 person hand held assist Transfers: Sit to/from Stand Sit to Stand: Min assist           General transfer comment: pt preferring HHA to steady self with rise from EOB, pt with slight posterior lean on standing      Balance Overall balance assessment: Needs assistance Sitting-balance support: No upper extremity supported, Feet supported Sitting balance-Leahy Scale: Good     Standing balance support: Bilateral upper extremity supported, During functional activity Standing balance-Leahy Scale: Fair Standing balance comment: pt reliant on support of counter for oral hygeine at sink.                           ADL either performed or assessed with clinical judgement   ADL Overall ADL's : Needs assistance/impaired     Grooming: Oral care;Minimal assistance;Standing Grooming Details (indicate cue type and reason): for balance; pt with tendency to lean over sink Upper Body Bathing: Set up;Sitting   Lower Body Bathing: Minimal assistance;Sit to/from stand   Upper Body Dressing : Set up;Sitting   Lower Body Dressing: Moderate assistance;Sit to/from stand   Toilet Transfer: Minimal assistance           Functional mobility during ADLs: Minimal assistance General ADL Comments: min A with one person HHA.     Vision Patient Visual Report: No change from baseline (pt denied changes)       Perception         Praxis         Pertinent Vitals/Pain Pain Assessment Pain Assessment: Faces Faces  Pain Scale: No hurt Pain Intervention(s): Limited activity within patient's tolerance     Extremity/Trunk Assessment Upper Extremity Assessment Upper Extremity Assessment: Generalized weakness   Lower Extremity Assessment Lower Extremity Assessment: Defer to PT evaluation   Cervical / Trunk Assessment Cervical / Trunk Assessment: Normal   Communication Communication Communication:  Impaired Factors Affecting Communication: Non - English speaking, interpreter not available (interpreter via phone call)   Cognition Arousal: Alert Behavior During Therapy: WFL for tasks assessed/performed Cognition: No apparent impairments                               Following commands: Impaired Following commands impaired: Follows multi-step commands inconsistently     Cueing  General Comments   Cueing Techniques: Verbal cues;Tactile cues      Exercises     Shoulder Instructions      Home Living Family/patient expects to be discharged to:: Private residence Living Arrangements: Children Available Help at Discharge: Family Type of Home: House Home Access: Stairs to enter                         Additional Comments: Unable to get in touch with pt's daughter to confirm PLOF or home set up      Prior Functioning/Environment Prior Level of Function : Independent/Modified Independent             Mobility Comments: per pt may have been using SPC for mobility PTA ADLs Comments: likely independent    OT Problem List: Decreased strength;Impaired balance (sitting and/or standing);Decreased cognition;Decreased safety awareness;Decreased knowledge of use of DME or AE   OT Treatment/Interventions: Self-care/ADL training;Therapeutic exercise;DME and/or AE instruction      OT Goals(Current goals can be found in the care plan section)   Acute Rehab OT Goals Patient Stated Goal: go home OT Goal Formulation: With patient Time For Goal Achievement: 11/24/23 Potential to Achieve Goals: Good   OT Frequency:  Min 1X/week    Co-evaluation              AM-PAC OT "6 Clicks" Daily Activity     Outcome Measure Help from another person eating meals?: A Little Help from another person taking care of personal grooming?: A Little Help from another person toileting, which includes using toliet, bedpan, or urinal?: A Lot Help from another person  bathing (including washing, rinsing, drying)?: A Lot Help from another person to put on and taking off regular upper body clothing?: A Little Help from another person to put on and taking off regular lower body clothing?: A Lot 6 Click Score: 15   End of Session Equipment Utilized During Treatment: Gait belt Nurse Communication: Mobility status  Activity Tolerance: Patient tolerated treatment well Patient left: in chair;with call bell/phone within reach;with chair alarm set  OT Visit Diagnosis: Unsteadiness on feet (R26.81);Muscle weakness (generalized) (M62.81)                Time: 8119-1478 OT Time Calculation (min): 27 min Charges:  OT General Charges $OT Visit: 1 Visit OT Evaluation $OT Eval Moderate Complexity: 1 Mod  Julie Colon, Julie Colon [facility] Acute Rehabilitation Office: [phone]  Julie Colon 11/10/2023, 1:22 PM

## 2023-11-10 NOTE — Evaluation (Signed)
 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   Essential hypertension 03/20/2020   Hyponatremia 03/02/2020   Sepsis due to pneumonia (HCC) 03/02/2020   Noncompliance 03/02/2020   Leukocytosis 03/02/2020   Advanced age 03/02/2020   Pneumonia 03/02/2020   Hyperglycemic hyperosmolar nonketotic coma (HCC) 01/14/2020   Type 2 diabetes mellitus with complication, without long-term current use of insulin (HCC) 01/14/2020   Hyperkalemia 01/14/2020   AKI (acute kidney injury) (HCC) 01/14/2020   Past Medical History:  Past Medical History:  Diagnosis Date   Asthma    Constipation    Dementia (HCC)    Diabetes mellitus without complication (HCC)    GERD (gastroesophageal reflux disease)    Hypertension    Rectal prolapse    Past Surgical History:  Past Surgical History:  Procedure Laterality Date   none     HPI:  Patient is an 89 y.o. female presenting 11/08/23 with aphasia, R facial droop, and R weakness on 11/08/2023. 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 S/p TNK PMH significant for asthma, dementia, DM, GERD, HTN, rectal prolapse.   Assessment / Plan / Recommendation Clinical Impression  Video interpretation service utilized for speech and language assessment.  Pt presented with functional communication with no indications of aphasia. There was fluent and clear speech (no dysarthria). Naming to confrontation and responsive naming were WNL. She answered questions through interpretor appropriately.  Repetition  was intact.  Comprehension was [facility] with + ability to follow two-step commands, discriminate among named items, and answer yes/no questions reliably.  There were no obvious difficulties with communication.  Baseline cognition is unknown; however, there were no obvious deficits that were revealed through language/speech testing. No further acute care SLP f/u is indicated.    SLP Assessment  SLP Recommendation/Assessment: Patient does not need any further Speech Lanaguage Pathology Services SLP Visit Diagnosis: Aphasia (R47.01)    Recommendations for follow up therapy are one component of a multi-disciplinary discharge planning process, led by the attending physician.  Recommendations may be updated based on patient status, additional functional criteria and insurance authorization.    Follow Up Recommendations  No SLP follow up                       SLP Evaluation Cognition  Overall Cognitive Status: No family/caregiver present to determine baseline cognitive functioning Arousal/Alertness: Awake/alert Orientation Level: Oriented to person;Oriented to place;Disoriented to time;Disoriented to situation Attention: Sustained Sustained Attention: Appears intact Awareness: Appears intact       Comprehension  Auditory Comprehension Overall Auditory Comprehension: Appears within functional limits for tasks assessed Yes/No Questions: Within Functional Limits Commands: Within Functional Limits Conversation: Simple Visual Recognition/Discrimination Discrimination: Within Function Limits Reading Comprehension Reading Status: Not tested    Expression Expression Primary Mode of Expression: Verbal Verbal Expression Overall Verbal Expression: Appears within functional limits for tasks assessed Initiation: No impairment Automatic Speech: Social Response;Counting;Day of week Level of Generative/Spontaneous Verbalization: Conversation Repetition: No impairment Naming: No  impairment Pragmatics: No impairment Written Expression Written Expression: Not tested   Oral / Motor  Oral Motor/Sensory Function Overall  Oral Motor/Sensory Function: Within functional limits Motor Speech Overall Motor Speech: Appears within functional limits for tasks assessed            Blenda Mounts Laurice 11/10/2023, 3:30 PM Marchelle Folks L. Samson Frederic, MA CCC/SLP Clinical Specialist - Acute Care SLP Acute Rehabilitation Services Office number [phone]

## 2023-11-11 DIAGNOSIS — I63211 Cerebral infarction due to unspecified occlusion or stenosis of right vertebral arteries: Secondary | ICD-10-CM | POA: Diagnosis not present

## 2023-11-11 DIAGNOSIS — I10 Essential (primary) hypertension: Secondary | ICD-10-CM | POA: Diagnosis not present

## 2023-11-11 DIAGNOSIS — I6339 Cerebral infarction due to thrombosis of other cerebral artery: Secondary | ICD-10-CM | POA: Diagnosis not present

## 2023-11-11 DIAGNOSIS — R29702 NIHSS score 2: Secondary | ICD-10-CM | POA: Diagnosis not present

## 2023-11-11 DIAGNOSIS — E785 Hyperlipidemia, unspecified: Secondary | ICD-10-CM | POA: Diagnosis not present

## 2023-11-11 LAB — GLUCOSE, CAPILLARY
Glucose-Capillary: 186 mg/dL — ABNORMAL HIGH (ref 70–99)
Glucose-Capillary: 163 mg/dL — ABNORMAL HIGH (ref 70–99)
Glucose-Capillary: 254 mg/dL — ABNORMAL HIGH (ref 70–99)

## 2023-11-11 LAB — BASIC METABOLIC PANEL
Anion gap: 9 (ref 5–15)
BUN: 15 mg/dL (ref 8–23)
CO2: 24 mmol/L (ref 22–32)
Calcium: 9.4 mg/dL (ref 8.9–10.3)
Chloride: 103 mmol/L (ref 98–111)
Creatinine, Ser: 0.94 mg/dL (ref 0.44–1.00)
GFR, Estimated: 59 mL/min — ABNORMAL LOW (ref >60)
Glucose, Bld: 89 mg/dL (ref 70–99)
Potassium: 4.3 mmol/L (ref 3.5–5.1)
Sodium: 136 mmol/L (ref 135–145)

## 2023-11-11 LAB — CBC
HCT: 37.1 % (ref 36.0–46.0)
Hemoglobin: 12.3 g/dL (ref 12.0–15.0)
MCH: 30.1 pg (ref 26.0–34.0)
MCHC: 33.2 g/dL (ref 30.0–36.0)
MCV: 90.9 fL (ref 80.0–100.0)
Platelets: 267 10*3/uL (ref 150–400)
RBC: 4.08 MIL/uL (ref 3.87–5.11)
RDW: 13.9 % (ref 11.5–15.5)
WBC: 7.5 10*3/uL (ref 4.0–10.5)
nRBC: 0 % (ref 0.0–0.2)

## 2023-11-11 NOTE — Plan of Care (Signed)
  Problem: Fluid Volume: Goal: Ability to maintain a balanced intake and output will improve Outcome: Progressing   Problem: Metabolic: Goal: Ability to maintain appropriate glucose levels will improve Outcome: Progressing   Problem: Nutritional: Goal: Maintenance of adequate nutrition will improve Outcome: Progressing   Problem: Skin Integrity: Goal: Risk for impaired skin integrity will decrease Outcome: Progressing   Problem: Tissue Perfusion: Goal: Adequacy of tissue perfusion will improve Outcome: Progressing   Problem: Education: Goal: Knowledge of disease or condition will improve Outcome: Progressing   Problem: Ischemic Stroke/TIA Tissue Perfusion: Goal: Complications of ischemic stroke/TIA will be minimized Outcome: Progressing   Problem: Coping: Goal: Will verbalize positive feelings about self Outcome: Progressing   Problem: Health Behavior/Discharge Planning: Goal: Goals will be collaboratively established with patient/family Outcome: Progressing   Problem: Nutrition: Goal: Risk of aspiration will decrease Outcome: Progressing Goal: Dietary intake will improve Outcome: Progressing   Problem: Education: Goal: Knowledge of General Education information will improve Description: Including pain rating scale, medication(s)/side effects and non-pharmacologic comfort measures Outcome: Progressing   Problem: Clinical Measurements: Goal: Respiratory complications will improve Outcome: Progressing Goal: Cardiovascular complication will be avoided Outcome: Progressing   Problem: Activity: Goal: Risk for activity intolerance will decrease Outcome: Progressing   Problem: Elimination: Goal: Will not experience complications related to bowel motility Outcome: Progressing Goal: Will not experience complications related to urinary retention Outcome: Progressing   Problem: Skin Integrity: Goal: Risk for impaired skin integrity will decrease Outcome:  Progressing

## 2023-11-11 NOTE — Progress Notes (Signed)
 STROKE TEAM PROGRESS NOTE   INTERIM HISTORY/SUBJECTIVE Her grandson is at the bedside.  [place] language video interpreter was used for this visit.  Patient states she is doing well.  She was able to ambulate with physical therapy earlier today.  She still has some right leg heaviness but it is improving.  Remains hemodynamically stable and afebrile .  PT/OT are recommending CIR. No new complaints.  Vital signs stable. OBJECTIVE  CBC    Component Value Date/Time   WBC 7.5 11/11/2023 1040   RBC 4.08 11/11/2023 1040   HGB 12.3 11/11/2023 1040   HGB 12.9 10/06/2023 1152   HCT 37.1 11/11/2023 1040   HCT 38.2 10/06/2023 1152   PLT 267 11/11/2023 1040   PLT 366 10/06/2023 1152   MCV 90.9 11/11/2023 1040   MCV 90 10/06/2023 1152   MCH 30.1 11/11/2023 1040   MCHC 33.2 11/11/2023 1040   RDW 13.9 11/11/2023 1040   RDW 11.9 10/06/2023 1152   LYMPHSABS 2.6 11/08/2023 1954   LYMPHSABS 3.0 03/20/2020 1135   MONOABS 0.7 11/08/2023 1954   EOSABS 0.2 11/08/2023 1954   EOSABS 0.3 03/20/2020 1135   BASOSABS 0.0 11/08/2023 1954   BASOSABS 0.0 03/20/2020 1135    BMET    Component Value Date/Time   NA 136 11/11/2023 0906   NA 137 10/06/2023 1152   K 4.3 11/11/2023 0906   CL 103 11/11/2023 0906   CO2 24 11/11/2023 0906   GLUCOSE 89 11/11/2023 0906   BUN 15 11/11/2023 0906   BUN 19 10/06/2023 1152   CREATININE 0.94 11/11/2023 0906   CREATININE 1.12 (H) 04/09/2022 1422   CALCIUM 9.4 11/11/2023 0906   EGFR 66 10/06/2023 1152   GFRNONAA 59 (L) 11/11/2023 0906   GFRNONAA 48 (L) 04/09/2022 1422    IMAGING past 24 hours No results found.   Vitals:   11/11/23 0007 11/11/23 0400 11/11/23 0916 11/11/23 1133  BP: (!) 153/71 (!) 143/72 136/72 (!) 146/65  Pulse: 82 89 94 78  Resp: 18 16 20 20   Temp: 98.4 F (36.9 C) 98.3 F (36.8 C) 97.9 F (36.6 C) 98.5 F (36.9 C)  TempSrc: Oral Oral Oral Oral  SpO2: 100% 96% 97%   Weight:         PHYSICAL EXAM General:  Alert, well-nourished,  well-developed pleasant elderly lady in no acute distress Psych:  Mood and affect appropriate for situation CV: Regular rate and rhythm on monitor Respiratory:  Regular, unlabored respirations on room air GI: Abdomen soft and nontender   NEURO:  Mental Status: AA&Ox3, patient is able to give some history Speech/Language: speech is without dysarthria or aphasia.    Cranial Nerves:  II: PERRL. Visual fields full.  III, IV, VI: EOMI. Eyelids elevate symmetrically.  V: Sensation is intact to light touch and symmetrical to face.  VII: Face is symmetrical resting and smiling VIII: hearing intact to voice. IX, X: Phonation is normal.  XII: tongue is midline without fasciculations. Motor: full strength in bilateral upper extremities but diminished fine finger movements on the right Weakness in right lower extremity with drift.   Tone: is normal and bulk is normal Sensation- Intact to light touch bilaterally.   Coordination: FTN intact bilaterally Gait- deferred  Most Recent NIH 2     ASSESSMENT/PLAN  Ms. Nyilah Rinella is a 89 y.o. female with history of asthma, dementia, DM, GERD, HTN and rectal prolapse who presents from home to the ED via EMS as a Code Stroke for acute onset  of aphasia, right facial droop and right sided weakness.  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 Critical stenosis of the proximal right vertebral artery, nearly occlusive. MRI acute perforator infarcts in left basal ganglia with petechial hemorrhage 2D Echo EF 60-65%, grade 1 diastolic dysfunction, normal left atrial size, no atrial level shunt LDL 37 HgbA1c 7.1 VTE prophylaxis - SCDs aspirin 81 mg daily prior to admission, now on aspirin 81 mg daily alone due to petechial hemorrhagic  transformation Therapy recommendations:  CIR Disposition: Transfer out of ICU today  Hypertension Home meds:  norvasc, hydralazine, lopressor, lasix Stable Blood Pressure Goal: BP less than 180/105   Hyperlipidemia Home meds:  Crestor, resumed in hospital LDL 46, goal < 70 Continue statin at discharge  Diabetes type II Controlled Home meds:  Metformin  HgbA1c 7.7, goal < 7.0 CBGs SSI Recommend close follow-up with PCP for better DM control  Other Stroke Risk Factors Obesity, Body mass index is 28.23 kg/m., BMI >/= 30 associated with increased stroke risk, recommend weight loss, diet and exercise as appropriate   Asthma Dulera inhaler  Dementia Lives with daughter and is not typically left alone No medications   Hospital day # 3  Patient seen and examined by NP/APP with MD. MD to update note as needed.   Patient is neurologically stable but still has some mild right leg weakness and MRI scan shows a left basal ganglia infarct with some petechial hemorrhage transformation.  Continue aspirin alone due to hemorrhagic transformation.  Mobilize out of bed.  Continue physical occupational therapy consults.  Transfer to inpatient rehab in the next few days when bed available..  . Long discussion with patient and grandson at the bedside using [place] language interpreter and answering questions.  Greater than 50% time during this 35-minute visit was spent in counseling and coordination of care and discussion patient and care team answering questions.  Delia Heady, MD Medical Director [facility] Pager: [phone] 11/11/2023 2:53 PM   To contact Stroke Continuity provider, please refer to [email or web address]. After hours, contact General Neurology

## 2023-11-11 NOTE — Progress Notes (Signed)
 Physical Therapy Treatment Patient Details Name: Julie Colon MRN: [id number] DOB: Sep 11, 1933 Today's Date: 11/11/2023   History of Present Illness Patient is an 89 y.o. female presenting 11/08/23 with aphasia, R facial droop, and R weakness on 11/08/2023. 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 S/p TNK PMH significant for asthma, dementia, DM, GERD, HTN, rectal prolapse.    PT Comments  Pt progressing well with mobility. Min assist bed mobility, min assist transfers, and min assist amb 1' with RW. Unsteady gait. Good sitting balance and poor standing balance. Daughter present during session to interpret. She reports pt uses a rollator at baseline. Pt in recliner with feet elevated at end of session. Current POC remains appropriate.     If plan is discharge home, recommend the following: A little help with walking and/or transfers;A little help with bathing/dressing/bathroom;Assistance with cooking/housework;Direct supervision/assist for medications management;Assist for transportation;Help with stairs or ramp for entrance;Supervision due to cognitive status   Can travel by private vehicle        Equipment Recommendations  None recommended by PT (Per daughter, pt has rollator at home.)    Recommendations for Other Services       Precautions / Restrictions Precautions Precautions: Fall Recall of Precautions/Restrictions: Impaired Precaution/Restrictions Comments: BP <180/110     Mobility  Bed Mobility Overal bed mobility: Needs Assistance Bed Mobility: Supine to Sit     Supine to sit: Min assist, HOB elevated, Used rails          Transfers Overall transfer level: Needs assistance Equipment used: Ambulation equipment used Transfers: Sit to/from Stand Sit to Stand: Min assist           General transfer comment: increased time to stabilize balance    Ambulation/Gait Ambulation/Gait assistance: Min  assist Gait Distance (Feet): 80 Feet Assistive device: Rolling walker (2 wheels) Gait Pattern/deviations: Step-through pattern, Decreased stride length, Drifts right/left Gait velocity: decreased Gait velocity interpretation: <1.31 ft/sec, indicative of household ambulator   General Gait Details: unsteady, required min assist to maintain balance, cues to stay close to [employer]     Tilt Bed    Modified Rankin (Stroke Patients Only) Modified Rankin (Stroke Patients Only) Pre-Morbid Rankin Score: Moderate disability Modified Rankin: Moderately severe disability     Balance Overall balance assessment: Needs assistance Sitting-balance support: No upper extremity supported, Feet supported Sitting balance-Leahy Scale: Good     Standing balance support: Bilateral upper extremity supported, During functional activity, Reliant on assistive device for balance Standing balance-Leahy Scale: Poor                              Communication Communication Communication: Impaired Factors Affecting Communication: Non - English speaking, interpreter not available  Cognition Arousal: Alert Behavior During Therapy: WFL for tasks assessed/performed   PT - Cognitive impairments: Problem solving, Safety/Judgement, Awareness, Memory                       PT - Cognition Comments: Pt's daughter present to interpret. Following commands: Impaired Following commands impaired: Follows multi-step commands inconsistently    Cueing Cueing Techniques: Verbal cues, Tactile cues  Exercises      General Comments General comments (skin integrity, edema, etc.): VSS on RA      Pertinent Vitals/Pain Pain Assessment Pain Assessment: Faces Faces  Pain Scale: No hurt    Home Living                          Prior Function            PT Goals (current goals can now be found in the care plan section) Acute Rehab PT  Goals Patient Stated Goal: home Progress towards PT goals: Progressing toward goals    Frequency    Min 1X/week      PT Plan      Co-evaluation              AM-PAC PT "6 Clicks" Mobility   Outcome Measure  Help needed turning from your back to your side while in a flat bed without using bedrails?: A Little Help needed moving from lying on your back to sitting on the side of a flat bed without using bedrails?: A Little Help needed moving to and from a bed to a chair (including a wheelchair)?: A Little Help needed standing up from a chair using your arms (e.g., wheelchair or bedside chair)?: A Little Help needed to walk in hospital room?: A Little Help needed climbing 3-5 steps with a railing? : A Lot 6 Click Score: 17    End of Session Equipment Utilized During Treatment: Gait belt Activity Tolerance: Patient tolerated treatment well Patient left: in chair;with call bell/phone within reach;with family/visitor present Nurse Communication: Mobility status PT Visit Diagnosis: Other abnormalities of gait and mobility (R26.89);Unsteadiness on feet (R26.81);Difficulty in walking, not elsewhere classified (R26.2);Other symptoms and signs involving the nervous system (R29.898)     Time: 1610-9604 PT Time Calculation (min) (ACUTE ONLY): 25 min  Charges:    $Gait Training: 23-37 mins PT General Charges $$ ACUTE PT VISIT: 1 Visit                     Ferd Glassing., PT  Office # [phone]    Ilda Foil 11/11/2023, 11:24 AM

## 2023-11-11 NOTE — Consult Note (Signed)
 Physical Medicine and Rehabilitation Consult Reason for Consult:right sided weakness and word finding deficits Referring Physician: Pearlean Brownie   HPI: Julie Colon is a 89 y.o. female with a history of asthma, dementia, diabetes, hypertension who presented on 11/08/2023 as a code stroke with acute onset of right-sided weakness, facial droop and aphasia.  CTA head demonstrated a small age-indeterminate right occipital infarct.  CTA of the head and neck revealed no emergent large vessel occlusion but there was critical stenosis of the right proximal vertebral artery.  MRI was done demonstrating acute perforator infarcts in the left basal ganglia with petechial hemorrhage.  Patient is on aspirin 81 mg daily prior to admission and was left on this for now given her petechial hemorrhagic transformation.  Patient was seen by therapy yesterday and was min assist for sit to stand transfers and for gait 15 feet using hand-held assistance.  She was min to mod assist for basic ADLs.  Patient lives with children and apparently was modified independent prior to arrival using a single-point cane potentially.   Review of Systems  Unable to perform ROS: Language   Past Medical History:  Diagnosis Date   Asthma    Constipation    Dementia (HCC)    Diabetes mellitus without complication (HCC)    GERD (gastroesophageal reflux disease)    Hypertension    Rectal prolapse    Past Surgical History:  Procedure Laterality Date   none     Family History  Problem Relation Age of Onset   Diabetes Mother    Hypertension Mother    Colon cancer Neg Hx    Stomach cancer Neg Hx    Pancreatic cancer Neg Hx    Esophageal cancer Neg Hx    Liver disease Neg Hx    Social History:  reports that she has quit smoking. She has never used smokeless tobacco. She reports that she does not drink alcohol and does not use drugs. Allergies: No Known Allergies Medications Prior to Admission  Medication Sig Dispense Refill    amLODipine (NORVASC) 10 MG tablet Take 1 tablet (10 mg total) by mouth daily. 90 tablet 0   aspirin EC 81 MG tablet Take 1 tablet (81 mg total) by mouth daily. Swallow whole. (Patient taking differently: Take 81 mg by mouth daily as needed for mild pain (pain score 1-3). Swallow whole.)     Carboxymethylcellulose Sodium (REFRESH PLUS OP) Apply to eye.     furosemide (LASIX) 20 MG tablet Take 1/2 tablet by mouth 1-2 times a week as need for swelling in legs 20 tablet 3   hydrALAZINE (APRESOLINE) 100 MG tablet Take 1 tablet (100 mg total) by mouth 3 (three) times daily. 90 tablet 0   insulin glargine (LANTUS SOLOSTAR) 100 UNIT/ML Solostar Pen Inject 20 Units into the skin daily. 15 mL 3   metFORMIN (GLUCOPHAGE) 500 MG tablet Take 1 tablet (500 mg total) by mouth 2 (two) times daily with a meal. 60 tablet 5   metoprolol tartrate (LOPRESSOR) 25 MG tablet Take 1 tablet (25 mg total) by mouth 2 (two) times daily. 60 tablet 5   mometasone-formoterol (DULERA) 100-5 MCG/ACT AERO Inhale 2 puffs into the lungs daily.     pantoprazole (PROTONIX) 20 MG tablet Take 1 tablet (20 mg total) by mouth daily. 30 tablet 3   rosuvastatin (CRESTOR) 10 MG tablet Take 1 tablet (10 mg total) by mouth daily. 90 tablet 3   acetaminophen (TYLENOL) 325 MG tablet Take 650 mg  by mouth every 8 (eight) hours.      Home: Home Living Family/patient expects to be discharged to:: Private residence Living Arrangements: Children Available Help at Discharge: Family Type of Home: House Home Access: Stairs to enter Additional Comments: Unable to get in touch with pt's daughter to confirm PLOF or home set up  Lives With: Family  Functional History: Prior Function Prior Level of Function : Independent/Modified Independent Mobility Comments: per pt may have been using SPC for mobility PTA ADLs Comments: likely independent Functional Status:  Mobility: Bed Mobility General bed mobility comments: on bsc on  arrival Transfers Overall transfer level: Needs assistance Equipment used: 1 person hand held assist Transfers: Sit to/from Stand Sit to Stand: Min assist General transfer comment: pt preferring HHA to steady self with rise from EOB, pt with slight posterior lean on standing Ambulation/Gait Ambulation/Gait assistance: Min assist Gait Distance (Feet): 15 Feet Assistive device: 1 person hand held assist Gait Pattern/deviations: Step-through pattern, Decreased step length - right, Decreased step length - left, Decreased stride length, Drifts right/left General Gait Details: HHA on Lt side, min assist to stabilize weight shift Rt/Lt for gait and turns. Pt amb in room bed>recliner and recliner>bathroom for self care and oral hygeine standing at sink. pt with 1x LOB to Rt and slight lean Lt to compensate/correct. Min assist to [employer]. Gait velocity: decr    ADL: ADL Overall ADL's : Needs assistance/impaired Grooming: Oral care, Minimal assistance, Standing Grooming Details (indicate cue type and reason): for balance; pt with tendency to lean over sink Upper Body Bathing: Set up, Sitting Lower Body Bathing: Minimal assistance, Sit to/from stand Upper Body Dressing : Set up, Sitting Lower Body Dressing: Moderate assistance, Sit to/from stand Toilet Transfer: Minimal assistance Functional mobility during ADLs: Minimal assistance General ADL Comments: min A with one person HHA.  Cognition: Cognition Overall Cognitive Status: No family/caregiver present to determine baseline cognitive functioning Arousal/Alertness: Awake/alert Orientation Level: Oriented to person Attention: Sustained Sustained Attention: Appears intact Awareness: Appears intact Cognition Arousal: Alert Behavior During Therapy: WFL for tasks assessed/performed Overall Cognitive Status: No family/caregiver present to determine baseline cognitive functioning  Blood pressure 136/72, pulse 94, temperature 97.9 F  (36.6 C), temperature source Oral, resp. rate 20, weight 63.4 kg, SpO2 97%. Physical Exam Constitutional:      General: She is not in acute distress.    Appearance: She is not ill-appearing.  HENT:     Right Ear: External ear normal.     Left Ear: External ear normal.     Nose: Nose normal.  Eyes:     Extraocular Movements: Extraocular movements intact.     Conjunctiva/sclera: Conjunctivae normal.     Pupils: Pupils are equal, round, and reactive to light.  Cardiovascular:     Rate and Rhythm: Normal rate.  Musculoskeletal:     Cervical back: Normal range of motion.  Skin:    General: Skin is warm.  Neurological:     Mental Status: She is alert.     Comments: Pt is alert. Can follow commands when son helps to translate. Pt able to understand some basic english. Right facial droop and tongue deviation. Speech sl slurred and low volume. Mild right hemiparesis 3+ to4/5 with pronator drift. RLE seems to be 4-/5. LUE and LLE 4 to 4+/5. Sensed pain in all 4's. No abnl resting tone appreciated.      Results for orders placed or performed during the hospital encounter of 11/08/23 (from the past 24 hours)  Glucose, capillary     Status: Abnormal   Collection Time: 11/10/23 11:35 AM  Result Value Ref Range   Glucose-Capillary 172 (H) 70 - 99 mg/dL  Glucose, capillary     Status: Abnormal   Collection Time: 11/10/23  4:41 PM  Result Value Ref Range   Glucose-Capillary 172 (H) 70 - 99 mg/dL   MR BRAIN WO CONTRAST Result Date: 11/09/2023 CLINICAL DATA:  Stroke, follow up EXAM: MRI HEAD WITHOUT CONTRAST TECHNIQUE: Multiplanar, multiecho pulse sequences of the brain and surrounding structures were obtained without intravenous contrast. COMPARISON:  CT head November 08, 2023. FINDINGS: Brain: Acute perforator infarcts in the left basal ganglia. Associated susceptibility artifact and mild T2 hypointensity, compatible with hemorrhage. No midline shift or substantial mass effect. No hydrocephalus or  visible mass lesion. Patchy T2/FLAIR hyperintensity white matter compatible with chronic microvascular ischemic disease. Cerebral atrophy. Remote perforator infarct in the left thalamus. Vascular: Major arterial flow voids are maintained at the skull base. Skull and upper cervical spine: Normal marrow signal. Sinuses/Orbits: Clear sinuses.  No acute orbital findings. Other: Bilateral mastoid effusions. IMPRESSION: Acute perforator infarcts in the left basal ganglia. Associated hemorrhage most likely is petechial, but recommend noncontrast head CT to further characterize and help exclude a mass occupying hemorrhagic transformation. These results will be called to the ordering clinician or representative by the Radiologist Assistant, and communication documented in the PACS or [employer]. Electronically Signed   By: Feliberto Harts M.D.   On: 11/09/2023 23:40   ECHOCARDIOGRAM COMPLETE Result Date: 11/09/2023    ECHOCARDIOGRAM REPORT   Patient Name:   ARTHEA NOBEL Date of Exam: 11/09/2023 Medical Rec #:  [id number]      Height:       59.0 in Accession #:    [id number]     Weight:       139.8 lb Date of Birth:  14-Feb-1934      BSA:          1.584 m Patient Age:    89 years       BP:           157/75 mmHg Patient Gender: F              HR:           84 bpm. Exam Location:  Inpatient Procedure: 2D Echo (Both Spectral and Color Flow Doppler were utilized during            procedure). Indications:    stroke  History:        Patient has no prior history of Echocardiogram examinations.                 Risk Factors:Diabetes and Hypertension.  Sonographer:    Delcie Roch RDCS Referring Phys: [phone] ERIC LINDZEN IMPRESSIONS  1. Left ventricular ejection fraction, by estimation, is 60 to 65%. The left ventricle has normal function. The left ventricle has no regional wall motion abnormalities. There is mild left ventricular hypertrophy. Left ventricular diastolic parameters are consistent with Grade I diastolic  dysfunction (impaired relaxation).  2. Right ventricular systolic function is normal. The right ventricular size is normal.  3. The mitral valve is normal in structure. No evidence of mitral valve regurgitation. No evidence of mitral stenosis.  4. The aortic valve is tricuspid. Aortic valve regurgitation is not visualized. No aortic stenosis is present.  5. The inferior vena cava is normal in size with greater than 50% respiratory variability, suggesting right atrial pressure of  3 mmHg. FINDINGS  Left Ventricle: Left ventricular ejection fraction, by estimation, is 60 to 65%. The left ventricle has normal function. The left ventricle has no regional wall motion abnormalities. The left ventricular internal cavity size was normal in size. There is  mild left ventricular hypertrophy. Left ventricular diastolic parameters are consistent with Grade I diastolic dysfunction (impaired relaxation). Right Ventricle: The right ventricular size is normal. Right vetricular wall thickness was not well visualized. Right ventricular systolic function is normal. Left Atrium: Left atrial size was normal in size. Right Atrium: Right atrial size was normal in size. Pericardium: There is no evidence of pericardial effusion. Mitral Valve: The mitral valve is normal in structure. No evidence of mitral valve regurgitation. No evidence of mitral valve stenosis. Tricuspid Valve: The tricuspid valve is normal in structure. Tricuspid valve regurgitation is not demonstrated. No evidence of tricuspid stenosis. Aortic Valve: The aortic valve is tricuspid. Aortic valve regurgitation is not visualized. No aortic stenosis is present. Aortic valve mean gradient measures 4.1 mmHg. Aortic valve peak gradient measures 8.1 mmHg. Aortic valve area, by VTI measures 1.69 cm. Pulmonic Valve: The pulmonic valve was not well visualized. Pulmonic valve regurgitation is not visualized. No evidence of pulmonic stenosis. Aorta: The aortic root and ascending aorta  are structurally normal, with no evidence of dilitation. Venous: The inferior vena cava is normal in size with greater than 50% respiratory variability, suggesting right atrial pressure of 3 mmHg. IAS/Shunts: No atrial level shunt detected by color flow Doppler.  LEFT VENTRICLE PLAX 2D LVIDd:         3.70 cm   Diastology LVIDs:         2.40 cm   LV e' medial:    5.77 cm/s LV PW:         1.00 cm   LV E/e' medial:  18.9 LV IVS:        1.20 cm   LV e' lateral:   6.64 cm/s LVOT diam:     2.00 cm   LV E/e' lateral: 16.4 LV SV:         52 LV SV Index:   33 LVOT Area:     3.14 cm  RIGHT VENTRICLE             IVC RV Basal diam:  2.30 cm     IVC diam: 1.70 cm RV S prime:     13.80 cm/s TAPSE (M-mode): 1.7 cm LEFT ATRIUM             Index        RIGHT ATRIUM           Index LA diam:        3.30 cm 2.08 cm/m   RA Area:     10.30 cm LA Vol (A2C):   31.3 ml 19.76 ml/m  RA Volume:   20.00 ml  12.63 ml/m LA Vol (A4C):   30.9 ml 19.51 ml/m LA Biplane Vol: 32.0 ml 20.20 ml/m  AORTIC VALVE AV Area (Vmax):    1.83 cm AV Area (Vmean):   1.86 cm AV Area (VTI):     1.69 cm AV Vmax:           142.09 cm/s AV Vmean:          95.420 cm/s AV VTI:            0.309 m AV Peak Grad:      8.1 mmHg AV Mean Grad:      4.1  mmHg LVOT Vmax:         82.60 cm/s LVOT Vmean:        56.500 cm/s LVOT VTI:          0.166 m LVOT/AV VTI ratio: 0.54  AORTA Ao Root diam: 2.90 cm Ao Asc diam:  3.30 cm MITRAL VALVE MV Area (PHT): 4.49 cm     SHUNTS MV Decel Time: 169 msec     Systemic VTI:  0.17 m MV E velocity: 109.00 cm/s  Systemic Diam: 2.00 cm MV A velocity: 129.00 cm/s MV E/A ratio:  0.84 Dina Rich MD Electronically signed by Dina Rich MD Signature Date/Time: 11/09/2023/4:45:26 PM    Final     Assessment/Plan: Diagnosis: 89 year old female with left basal ganglia infarct due to small vessel disease. Does the need for close, 24 hr/day medical supervision in concert with the patient's rehab needs make it unreasonable for this patient to  be served in a less intensive setting? Yes Co-Morbidities requiring supervision/potential complications:  -Hypertension -Dementia -Type 2 diabetes -History of asthma Due to bladder management, bowel management, safety, skin/wound care, disease management, medication administration, pain management, and patient education, does the patient require 24 hr/day rehab nursing? Yes Does the patient require coordinated care of a physician, rehab nurse, therapy disciplines of PT, OT, SLP to address physical and functional deficits in the context of the above medical diagnosis(es)? Yes Addressing deficits in the following areas: balance, endurance, locomotion, strength, transferring, bowel/bladder control, bathing, dressing, feeding, grooming, toileting, speech, and psychosocial support Can the patient actively participate in an intensive therapy program of at least 3 hrs of therapy per day at least 5 days per week? Yes The potential for patient to make measurable gains while on inpatient rehab is excellent Anticipated functional outcomes upon discharge from inpatient rehab are modified independent and supervision  with PT, modified independent and supervision with OT, modified independent and supervision with SLP. Estimated rehab length of stay to reach the above functional goals is: 9-15 days Anticipated discharge destination: Home Overall Rehab/Functional Prognosis: excellent  POST ACUTE RECOMMENDATIONS: This patient's condition is appropriate for continued rehabilitative care in the following setting: CIR Patient has agreed to participate in recommended program. Yes Note that insurance prior authorization may be required for reimbursement for recommended care.  Comment: Rehab Admissions Coordinator to follow up.  Pt has family who can assist after discharge, apparently son.    I have personally performed a face to face diagnostic evaluation of this patient. Additionally, I have examined the  patient's medical record including any pertinent labs and radiographic images.    Thanks,  Ranelle Oyster, MD 11/11/2023

## 2023-11-11 NOTE — Care Management Important Message (Signed)
 Important Message  Patient Details  Name: Julie Colon MRN: [id number] Date of Birth: 01/03/1934   Important Message Given:  Yes - Medicare IM     Dorena Bodo 11/11/2023, 3:00 PM

## 2023-11-11 NOTE — Progress Notes (Signed)
 Inpatient Rehab Admissions Coordinator:   Stopped by to see pt.  Grandson at bedside.  Explained CIR goals.  Grandson acknowledges plan for 24/7 supervision at discharge.  I left a message for pt's daughter, [name], to discuss rehab recommendations as well as goals for caregiver support.  Will await return call to ensure family wishes to pursue CIR prior to starting insurance request.   Estill Dooms, PT, DPT Admissions Coordinator [phone] 11/11/23  1:55 PM

## 2023-11-12 ENCOUNTER — Telehealth: Payer: Self-pay | Admitting: Internal Medicine

## 2023-11-12 DIAGNOSIS — E785 Hyperlipidemia, unspecified: Secondary | ICD-10-CM | POA: Diagnosis not present

## 2023-11-12 DIAGNOSIS — R29702 NIHSS score 2: Secondary | ICD-10-CM | POA: Diagnosis not present

## 2023-11-12 DIAGNOSIS — I10 Essential (primary) hypertension: Secondary | ICD-10-CM | POA: Diagnosis not present

## 2023-11-12 DIAGNOSIS — I63211 Cerebral infarction due to unspecified occlusion or stenosis of right vertebral arteries: Secondary | ICD-10-CM | POA: Diagnosis not present

## 2023-11-12 LAB — GLUCOSE, CAPILLARY
Glucose-Capillary: 185 mg/dL — ABNORMAL HIGH (ref 70–99)
Glucose-Capillary: 157 mg/dL — ABNORMAL HIGH (ref 70–99)
Glucose-Capillary: 247 mg/dL — ABNORMAL HIGH (ref 70–99)

## 2023-11-12 NOTE — Telephone Encounter (Signed)
 A document form has been faxed:  Addendum Request Form , to be filled out by provider. Send document back via Fax within 5-days. Document is located in providers tray at front office.           Fax number:  [phone]

## 2023-11-12 NOTE — Progress Notes (Signed)
 Inpatient Rehab Admissions Coordinator:   Continue to try and reach family to discuss caregiver support and potential CIR stay versus home with 24/7 support.     Estill Dooms, PT, DPT Admissions Coordinator [phone] 11/12/23  2:48 PM

## 2023-11-12 NOTE — Progress Notes (Signed)
 STROKE TEAM PROGRESS NOTE   INTERIM HISTORY/SUBJECTIVE Nobody is present at the bedside.  Patient states she is doing well.   She still has some right leg heaviness but it is improving.  Remains hemodynamically stable and afebrile .  PT/OT are recommending CIR. No new complaints.  Vital signs stable.  Awaiting bed and rehab OBJECTIVE  CBC    Component Value Date/Time   WBC 7.5 11/11/2023 1040   RBC 4.08 11/11/2023 1040   HGB 12.3 11/11/2023 1040   HGB 12.9 10/06/2023 1152   HCT 37.1 11/11/2023 1040   HCT 38.2 10/06/2023 1152   PLT 267 11/11/2023 1040   PLT 366 10/06/2023 1152   MCV 90.9 11/11/2023 1040   MCV 90 10/06/2023 1152   MCH 30.1 11/11/2023 1040   MCHC 33.2 11/11/2023 1040   RDW 13.9 11/11/2023 1040   RDW 11.9 10/06/2023 1152   LYMPHSABS 2.6 11/08/2023 1954   LYMPHSABS 3.0 03/20/2020 1135   MONOABS 0.7 11/08/2023 1954   EOSABS 0.2 11/08/2023 1954   EOSABS 0.3 03/20/2020 1135   BASOSABS 0.0 11/08/2023 1954   BASOSABS 0.0 03/20/2020 1135    BMET    Component Value Date/Time   NA 136 11/11/2023 0906   NA 137 10/06/2023 1152   K 4.3 11/11/2023 0906   CL 103 11/11/2023 0906   CO2 24 11/11/2023 0906   GLUCOSE 89 11/11/2023 0906   BUN 15 11/11/2023 0906   BUN 19 10/06/2023 1152   CREATININE 0.94 11/11/2023 0906   CREATININE 1.12 (H) 04/09/2022 1422   CALCIUM 9.4 11/11/2023 0906   EGFR 66 10/06/2023 1152   GFRNONAA 59 (L) 11/11/2023 0906   GFRNONAA 48 (L) 04/09/2022 1422    IMAGING past 24 hours No results found.   Vitals:   11/12/23 1213 11/12/23 1345 11/12/23 1411 11/12/23 1527  BP: 114/64 (!) 110/57 126/71 122/72  Pulse: 78 90  85  Resp: 18   18  Temp: 97.8 F (36.6 C)   98.2 F (36.8 C)  TempSrc: Oral   Oral  SpO2: 96%   97%  Weight:         PHYSICAL EXAM General:  Alert, well-nourished, well-developed pleasant elderly lady in no acute distress Psych:  Mood and affect appropriate for situation CV: Regular rate and rhythm on  monitor Respiratory:  Regular, unlabored respirations on room air GI: Abdomen soft and nontender   NEURO:  Mental Status: AA&Ox3, patient is able to give some history Speech/Language: speech is without dysarthria or aphasia.    Cranial Nerves:  II: PERRL. Visual fields full.  III, IV, VI: EOMI. Eyelids elevate symmetrically.  V: Sensation is intact to light touch and symmetrical to face.  VII: Face is symmetrical resting and smiling VIII: hearing intact to voice. IX, X: Phonation is normal.  XII: tongue is midline without fasciculations. Motor: full strength in bilateral upper extremities but diminished fine finger movements on the right Weakness in right lower extremity with drift.   Tone: is normal and bulk is normal Sensation- Intact to light touch bilaterally.   Coordination: FTN intact bilaterally Gait- deferred  Most Recent NIH 2     ASSESSMENT/PLAN  [name] is a 89 y.o. female with history of asthma, dementia, DM, GERD, HTN and rectal prolapse who presents from home to the ED via EMS as a Code Stroke for acute onset of aphasia, right facial droop and right sided weakness.  NIH on Admission 23  Acute Ischemic Infarct: Left basal ganglia likely due  to small vessel disease  etiology: Small vessel disease Code Stroke CT head Small age-indeterminate right occipital infarct  CTA head & neck No emergent large vessel occlusion. Critical stenosis of the proximal right vertebral artery, nearly occlusive. MRI acute perforator infarcts in left basal ganglia with petechial hemorrhage 2D Echo EF 60-65%, grade 1 diastolic dysfunction, normal left atrial size, no atrial level shunt LDL 37 HgbA1c 7.1 VTE prophylaxis - SCDs aspirin 81 mg daily prior to admission, now on aspirin 81 mg daily alone due to petechial hemorrhagic transformation Therapy recommendations:  CIR Disposition: Transfer out of ICU today  Hypertension Home meds:  norvasc, hydralazine, lopressor,  lasix Stable Blood Pressure Goal: BP less than 180/105   Hyperlipidemia Home meds:  Crestor, resumed in hospital LDL 46, goal < 70 Continue statin at discharge  Diabetes type II Controlled Home meds:  Metformin  HgbA1c 7.7, goal < 7.0 CBGs SSI Recommend close follow-up with PCP for better DM control  Other Stroke Risk Factors Obesity, Body mass index is 28.23 kg/m., BMI >/= 30 associated with increased stroke risk, recommend weight loss, diet and exercise as appropriate   Asthma Dulera inhaler  Dementia Lives with daughter and is not typically left alone No medications   Hospital day # 4     Continue aspirin alone due to hemorrhagic transformation.  Mobilize out of bed.  Continue physical occupational therapy consults.    . Medically stable to transfer to rehab when bed available  Delia Heady, MD Medical Director [facility] Pager: [phone] 11/12/2023 3:42 PM   To contact Stroke Continuity provider, please refer to [email or web address]. After hours, contact General Neurology

## 2023-11-12 NOTE — Progress Notes (Signed)
 Patient complained of some discomfort and shortness of breathe, her BP was 128/42, Spo2 99 in RA, HR in 100s, and after 5 to 10 minutes BP was 138/66, patient did state feeling better, informed MD, advised to monitor for now as patient is feeling better, will continue to monitor.

## 2023-11-12 NOTE — Progress Notes (Signed)
 Physical Therapy Treatment Patient Details Name: Julie Colon MRN: [id number] DOB: 10/13/1933 Today's Date: 11/12/2023   History of Present Illness Patient is an 89 y.o. female presenting 11/08/23 with aphasia, R facial droop, and R weakness on 11/08/2023. 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 S/p TNK PMH significant for asthma, dementia, DM, GERD, HTN, rectal prolapse.    PT Comments  Pt is progressing well towards goals. Pt was Cga for bed mobility, gait and stairs this session. Pt is improving significantly and getting close to baseline. Pt is motivated to improve and has supportive family. Due to pt current functional status, home set up and available assistance at home recommending skilled physical therapy services > 3 hours/day in order to address strength, balance and functional mobility to decrease risk for falls, injury, immobility, skin break down and re-hospitalization.      If plan is discharge home, recommend the following: A little help with walking and/or transfers;Assistance with cooking/housework;Assist for transportation;Help with stairs or ramp for entrance;Supervision due to cognitive status     Equipment Recommendations  None recommended by PT    Recommendations for Other Services Rehab consult     Precautions / Restrictions Precautions Precautions: Fall Recall of Precautions/Restrictions: Impaired Restrictions Weight Bearing Restrictions Per Provider Order: No     Mobility  Bed Mobility   Bed Mobility: Supine to Sit, Sit to Supine     Supine to sit: Contact guard Sit to supine: Contact guard assist        Transfers Overall transfer level: Needs assistance Equipment used: Rolling walker (2 wheels) Transfers: Sit to/from Stand Sit to Stand: Contact guard assist           General transfer comment: CGA for safety.    Ambulation/Gait Ambulation/Gait assistance: Contact guard  assist Gait Distance (Feet): 150 Feet Assistive device: Rolling walker (2 wheels), 1 person hand held assist Gait Pattern/deviations: Step-through pattern, Decreased stride length, Drifts right/left Gait velocity: decreased Gait velocity interpretation: <1.8 ft/sec, indicate of risk for recurrent falls   General Gait Details: Pt drifts R/L with RW unstead, CGA for safety, Pt ambulated without an AD using sink and HHA from physical therapist at CGA   Stairs Stairs: Yes Stairs assistance: Contact guard assist Stair Management: One rail Left, Step to pattern, Forwards Number of Stairs: 2 General stair comments: CGA for stairs   Modified Rankin (Stroke Patients Only) Modified Rankin (Stroke Patients Only) Pre-Morbid Rankin Score: Moderate disability Modified Rankin: Moderate disability     Balance Overall balance assessment: Needs assistance Sitting-balance support: No upper extremity supported, Feet supported Sitting balance-Leahy Scale: Good Sitting balance - Comments: sitting on toilet at supervision   Standing balance support: Bilateral upper extremity supported, During functional activity, Reliant on assistive device for balance Standing balance-Leahy Scale: Poor Standing balance comment: high risk for falls, CGA for safety due to unstable in standing.        Communication Communication Communication: Impaired Factors Affecting Communication: Non - English speaking, interpreter not available  Cognition Arousal: Alert Behavior During Therapy: WFL for tasks assessed/performed   PT - Cognitive impairments: Problem solving, Safety/Judgement, Awareness, Memory         PT - Cognition Comments: pt declined interpretor; no family present to interpret   Following commands impaired: Follows multi-step commands with increased time    Cueing Cueing Techniques: Verbal cues, Tactile cues     General Comments General comments (skin integrity, edema, etc.): HR remained in the  90's throughout session. No signs of respiratory distress.      Pertinent Vitals/Pain Pain Assessment Pain Assessment: No/denies pain Pain Intervention(s): Monitored during session     PT Goals (current goals can now be found in the care plan section) Acute Rehab PT Goals Patient Stated Goal: home PT Goal Formulation: With patient Time For Goal Achievement: 11/24/23 Potential to Achieve Goals: Good Progress towards PT goals: Progressing toward goals    Frequency    Min 2X/week      PT Plan  Continue with current POC        AM-PAC PT "6 Clicks" Mobility   Outcome Measure  Help needed turning from your back to your side while in a flat bed without using bedrails?: A Little Help needed moving from lying on your back to sitting on the side of a flat bed without using bedrails?: A Little Help needed moving to and from a bed to a chair (including a wheelchair)?: A Little Help needed standing up from a chair using your arms (e.g., wheelchair or bedside chair)?: A Little Help needed to walk in hospital room?: A Little Help needed climbing 3-5 steps with a railing? : A Little 6 Click Score: 18    End of Session Equipment Utilized During Treatment: Gait belt Activity Tolerance: Patient tolerated treatment well Patient left: in bed;with bed alarm set;with call bell/phone within reach Nurse Communication: Mobility status PT Visit Diagnosis: Other abnormalities of gait and mobility (R26.89);Unsteadiness on feet (R26.81);Difficulty in walking, not elsewhere classified (R26.2);Other symptoms and signs involving the nervous system (R29.898)     Time: 1610-9604 PT Time Calculation (min) (ACUTE ONLY): 20 min  Charges:    $Therapeutic Activity: 8-22 mins PT General Charges $$ ACUTE PT VISIT: 1 Visit                     Harrel Carina, DPT, CLT  Acute Rehabilitation Services Office: [phone] (Secure chat preferred)    Claudia Desanctis 11/12/2023, 3:16 PM

## 2023-11-12 NOTE — Plan of Care (Signed)
  Problem: Fluid Volume: Goal: Ability to maintain a balanced intake and output will improve Outcome: Progressing   Problem: Nutritional: Goal: Maintenance of adequate nutrition will improve Outcome: Progressing   Problem: Skin Integrity: Goal: Risk for impaired skin integrity will decrease Outcome: Progressing   Problem: Ischemic Stroke/TIA Tissue Perfusion: Goal: Complications of ischemic stroke/TIA will be minimized Outcome: Progressing   Problem: Coping: Goal: Will identify appropriate support needs Outcome: Progressing   Problem: Nutrition: Goal: Risk of aspiration will decrease Outcome: Progressing Goal: Dietary intake will improve Outcome: Progressing   Problem: Clinical Measurements: Goal: Respiratory complications will improve Outcome: Progressing Goal: Cardiovascular complication will be avoided Outcome: Progressing

## 2023-11-12 NOTE — Progress Notes (Signed)
 STROKE TEAM PROGRESS NOTE   INTERIM HISTORY/SUBJECTIVE No family at the bedside. She still has some right leg heaviness but it is improving.  Remains hemodynamically stable and afebrile .  PT/OT are recommending CIR. No new complaints.  Vital signs stable.  OBJECTIVE  CBC    Component Value Date/Time   WBC 7.5 11/11/2023 1040   RBC 4.08 11/11/2023 1040   HGB 12.3 11/11/2023 1040   HGB 12.9 10/06/2023 1152   HCT 37.1 11/11/2023 1040   HCT 38.2 10/06/2023 1152   PLT 267 11/11/2023 1040   PLT 366 10/06/2023 1152   MCV 90.9 11/11/2023 1040   MCV 90 10/06/2023 1152   MCH 30.1 11/11/2023 1040   MCHC 33.2 11/11/2023 1040   RDW 13.9 11/11/2023 1040   RDW 11.9 10/06/2023 1152   LYMPHSABS 2.6 11/08/2023 1954   LYMPHSABS 3.0 03/20/2020 1135   MONOABS 0.7 11/08/2023 1954   EOSABS 0.2 11/08/2023 1954   EOSABS 0.3 03/20/2020 1135   BASOSABS 0.0 11/08/2023 1954   BASOSABS 0.0 03/20/2020 1135    BMET    Component Value Date/Time   NA 136 11/11/2023 0906   NA 137 10/06/2023 1152   K 4.3 11/11/2023 0906   CL 103 11/11/2023 0906   CO2 24 11/11/2023 0906   GLUCOSE 89 11/11/2023 0906   BUN 15 11/11/2023 0906   BUN 19 10/06/2023 1152   CREATININE 0.94 11/11/2023 0906   CREATININE 1.12 (H) 04/09/2022 1422   CALCIUM 9.4 11/11/2023 0906   EGFR 66 10/06/2023 1152   GFRNONAA 59 (L) 11/11/2023 0906   GFRNONAA 48 (L) 04/09/2022 1422    IMAGING past 24 hours No results found.   Vitals:   11/11/23 2001 11/12/23 0001 11/12/23 0446 11/12/23 0734  BP: (!) 143/70 126/60 (!) 150/83 (!) 99/47  Pulse: (!) 106 70 80 79  Resp: 18   18  Temp: 98.8 F (37.1 C) 98.6 F (37 C) 98.2 F (36.8 C) 97.8 F (36.6 C)  TempSrc: Oral Oral Oral Oral  SpO2: 97% 93% 100% 94%  Weight:         PHYSICAL EXAM General:  Alert, well-nourished, well-developed pleasant elderly lady in no acute distress Psych:  Mood and affect appropriate for situation CV: Regular rate and rhythm on monitor Respiratory:   Regular, unlabored respirations on room air GI: Abdomen soft and nontender   NEURO:  Mental Status: AA&Ox3, patient is able to give some history Speech/Language: speech is without dysarthria or aphasia.    Cranial Nerves:  II: PERRL. Visual fields full.  III, IV, VI: EOMI. Eyelids elevate symmetrically.  V: Sensation is intact to light touch and symmetrical to face.  VII: Face is symmetrical resting and smiling VIII: hearing intact to voice. IX, X: Phonation is normal.  XII: tongue is midline without fasciculations. Motor: full strength in bilateral upper extremities but diminished fine finger movements on the right Weakness in right lower extremity with drift.   Tone: is normal and bulk is normal Sensation- Intact to light touch bilaterally.   Coordination: FTN intact bilaterally Gait- deferred  Most Recent NIH 2     ASSESSMENT/PLAN  [name] is a 89 y.o. female with history of asthma, dementia, DM, GERD, HTN and rectal prolapse who presents from home to the ED via EMS as a Code Stroke for acute onset of aphasia, right facial droop and right sided weakness.  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 Critical stenosis of the proximal right vertebral artery, nearly occlusive. MRI acute perforator infarcts in left basal ganglia with petechial hemorrhage 2D Echo EF 60-65%, grade 1 diastolic dysfunction, normal left atrial size, no atrial level shunt LDL 37 HgbA1c 7.1 VTE prophylaxis - SCDs aspirin 81 mg daily prior to admission, now on aspirin 81 mg daily alone due to petechial hemorrhagic transformation Therapy recommendations:  CIR Disposition: Discharge to CIR when bed available  Hypertension Home meds:  norvasc, hydralazine, lopressor,  lasix Stable Blood Pressure Goal: BP less than 180/105   Hyperlipidemia Home meds:  Crestor, resumed in hospital LDL 46, goal < 70 Continue statin at discharge  Diabetes type II Controlled Home meds:  Metformin  HgbA1c 7.7, goal < 7.0 CBGs SSI Recommend close follow-up with PCP for better DM control  Other Stroke Risk Factors Obesity, Body mass index is 28.23 kg/m., BMI >/= 30 associated with increased stroke risk, recommend weight loss, diet and exercise as appropriate   Asthma Dulera inhaler  Dementia Lives with daughter and is not typically left alone No medications   Hospital day # 4  Patient seen and examined by NP/APP with MD. MD to update note as needed.   Elmer Picker, DNP, FNP-BC [facility] Pager: [phone]   To contact Stroke Continuity provider, please refer to [email or web address]. After hours, contact General Neurology

## 2023-11-12 NOTE — Progress Notes (Signed)
 Occupational Therapy Treatment Patient Details Name: Julie Colon MRN: [id number] DOB: September 16, 1933 Today's Date: 11/12/2023   History of present illness Patient is an 89 y.o. female presenting 11/08/23 with aphasia, R facial droop, and R weakness on 11/08/2023. 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 S/p TNK PMH significant for asthma, dementia, DM, GERD, HTN, rectal prolapse.   OT comments  Pt with good progression toward established OT goals. Challenging balance and and strength this session with OOB mobility with RW, LB Adl, and standing grooming. Pt needing grossly CGA-min A. Pt with poor safety awareness with RW and pre-planning for turns to avoid bumping into objects. Will continue to follow to optimize safety and independence with ADL and IADL. Due to significant change in functional status, recommending intensive multidisciplinary rehabilitation >3 hours/day to optimize safety and independence in ADL.        If plan is discharge home, recommend the following:  A little help with bathing/dressing/bathroom;Assistance with cooking/housework;Assist for transportation;Help with stairs or ramp for entrance;A little help with walking and/or transfers   Equipment Recommendations  Other (comment) (defer)    Recommendations for Other Services Rehab consult    Precautions / Restrictions Precautions Precautions: Fall Recall of Precautions/Restrictions: Impaired       Mobility Bed Mobility Overal bed mobility: Needs Assistance Bed Mobility: Supine to Sit, Sit to Supine     Supine to sit: Min assist, HOB elevated, Used rails Sit to supine: Contact guard assist   General bed mobility comments: min A to elevate trunk    Transfers Overall transfer level: Needs assistance Equipment used: Rolling walker (2 wheels) Transfers: Sit to/from Stand Sit to Stand: Min assist           General transfer comment: increased  time to stabilize balance     Balance Overall balance assessment: Needs assistance Sitting-balance support: No upper extremity supported, Feet supported Sitting balance-Leahy Scale: Good     Standing balance support: Bilateral upper extremity supported, During functional activity, Reliant on assistive device for balance Standing balance-Leahy Scale: Poor                             ADL either performed or assessed with clinical judgement   ADL Overall ADL's : Needs assistance/impaired     Grooming: Contact guard assist;Oral care;Standing               Lower Body Dressing: Minimal assistance;Sit to/from stand Lower Body Dressing Details (indicate cue type and reason): for doffing old and donning new brief Toilet Transfer: Minimal assistance;Rolling walker (2 wheels) Toilet Transfer Details (indicate cue type and reason): for RW management but otherwise balance much improved with RW Toileting- Clothing Manipulation and Hygiene: Contact guard assist;Sitting/lateral lean       Functional mobility during ADLs: Minimal assistance;Rolling walker (2 wheels)      Extremity/Trunk Assessment Upper Extremity Assessment Upper Extremity Assessment: Generalized weakness   Lower Extremity Assessment Lower Extremity Assessment: Defer to PT evaluation        Vision       Perception     Praxis     Communication Communication Communication: Impaired Factors Affecting Communication: Non - English speaking, interpreter not available   Cognition Arousal: Alert Behavior During Therapy: WFL for tasks assessed/performed Cognition: No apparent impairments, No family/caregiver present to determine baseline             OT - Cognition Comments:  follows one step commands WFL; not formally assessed this session. does well with sequencing ADL. dementia at baseline. poor safety awareness with RW but per her report on eval, she does not use one at baseline                  Following commands: Impaired Following commands impaired: Follows multi-step commands inconsistently      Cueing   Cueing Techniques: Verbal cues, Tactile cues  Exercises      Shoulder Instructions       General Comments VSS on RA    Pertinent Vitals/ Pain       Pain Assessment Pain Assessment: Faces Pain Score: 0-No pain Pain Intervention(s): Monitored during session  Home Living                                          Prior Functioning/Environment              Frequency  Min 1X/week        Progress Toward Goals  OT Goals(current goals can now be found in the care plan section)  Progress towards OT goals: Progressing toward goals  Acute Rehab OT Goals Patient Stated Goal: none stated OT Goal Formulation: With patient Time For Goal Achievement: 11/24/23 Potential to Achieve Goals: Good ADL Goals Pt Will Perform Grooming: with supervision;standing Pt Will Perform Lower Body Dressing: with supervision;sit to/from stand Pt Will Transfer to Toilet: with contact guard assist  Plan      Co-evaluation                 AM-PAC OT "6 Clicks" Daily Activity     Outcome Measure   Help from another person eating meals?: None Help from another person taking care of personal grooming?: A Little Help from another person toileting, which includes using toliet, bedpan, or urinal?: A Little Help from another person bathing (including washing, rinsing, drying)?: A Little Help from another person to put on and taking off regular upper body clothing?: A Little Help from another person to put on and taking off regular lower body clothing?: A Little 6 Click Score: 19    End of Session Equipment Utilized During Treatment: Gait belt  OT Visit Diagnosis: Unsteadiness on feet (R26.81);Muscle weakness (generalized) (M62.81)   Activity Tolerance Patient tolerated treatment well   Patient Left with call bell/phone within reach;in bed;with  bed alarm set   Nurse Communication Mobility status        Time: 0981-1914 OT Time Calculation (min): 20 min  Charges: OT General Charges $OT Visit: 1 Visit OT Treatments $Self Care/Home Management : 8-22 mins  Julie Colon, OTR/L [facility] Acute Rehabilitation Office: [phone]   Julie Colon 11/12/2023, 1:26 PM

## 2023-11-13 ENCOUNTER — Other Ambulatory Visit: Payer: Self-pay | Admitting: Internal Medicine

## 2023-11-13 ENCOUNTER — Other Ambulatory Visit: Payer: Self-pay

## 2023-11-13 DIAGNOSIS — E785 Hyperlipidemia, unspecified: Secondary | ICD-10-CM | POA: Diagnosis not present

## 2023-11-13 DIAGNOSIS — I10 Essential (primary) hypertension: Secondary | ICD-10-CM | POA: Diagnosis not present

## 2023-11-13 DIAGNOSIS — E1165 Type 2 diabetes mellitus with hyperglycemia: Secondary | ICD-10-CM

## 2023-11-13 DIAGNOSIS — R29702 NIHSS score 2: Secondary | ICD-10-CM | POA: Diagnosis not present

## 2023-11-13 DIAGNOSIS — I63211 Cerebral infarction due to unspecified occlusion or stenosis of right vertebral arteries: Secondary | ICD-10-CM | POA: Diagnosis not present

## 2023-11-13 LAB — GLUCOSE, CAPILLARY
Glucose-Capillary: 171 mg/dL — ABNORMAL HIGH (ref 70–99)
Glucose-Capillary: 306 mg/dL — ABNORMAL HIGH (ref 70–99)
Glucose-Capillary: 102 mg/dL — ABNORMAL HIGH (ref 70–99)

## 2023-11-13 MED ORDER — INSULIN GLARGINE 100 UNIT/ML ~~LOC~~ SOLN
10.0000 [IU] | Freq: Every day | SUBCUTANEOUS | Status: DC
  Administered 2023-11-13: 10 [IU] via SUBCUTANEOUS
  Filled 2023-11-13: qty 0.1

## 2023-11-13 NOTE — Plan of Care (Signed)
   Problem: Coping: Goal: Ability to adjust to condition or change in health will improve Outcome: Progressing   Problem: Metabolic: Goal: Ability to maintain appropriate glucose levels will improve Outcome: Progressing   Problem: Nutritional: Goal: Maintenance of adequate nutrition will improve Outcome: Progressing   Problem: Skin Integrity: Goal: Risk for impaired skin integrity will decrease Outcome: Progressing   Problem: Tissue Perfusion: Goal: Adequacy of tissue perfusion will improve Outcome: Progressing

## 2023-11-13 NOTE — Progress Notes (Signed)
 Physical Therapy Treatment Patient Details Name: Julie Colon MRN: [id number] DOB: 11-Jul-1934 Today's Date: 11/13/2023   History of Present Illness Patient is an 89 y.o. female presenting 11/08/23 with aphasia, R facial droop, and R weakness on 11/08/2023. 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 S/p TNK PMH significant for asthma, dementia, DM, GERD, HTN, rectal prolapse.    PT Comments  Pt continues to have difficulty navigating gait in a straight line and requires multi modal cues for improve object navigation and drifting toward the R. Pt has poor awareness of when she is drifting and when cued over corrects placing pt at high risk for falls. Due to pt current functional status, home set up and available assistance at home recommending skilled physical therapy services > 3 hours/day in order to address strength, balance and functional mobility to decrease risk for falls, injury, immobility, skin break down and re-hospitalization.      If plan is discharge home, recommend the following: A little help with walking and/or transfers;Assistance with cooking/housework;Assist for transportation;Help with stairs or ramp for entrance;Supervision due to cognitive status     Equipment Recommendations  None recommended by PT       Precautions / Restrictions Precautions Recall of Precautions/Restrictions: Impaired Restrictions Weight Bearing Restrictions Per Provider Order: No     Mobility  Bed Mobility   General bed mobility comments: pt in recliner on arrival and departure.    Transfers Overall transfer level: Needs assistance Equipment used: None Transfers: Sit to/from Stand Sit to Stand: Supervision       General transfer comment: sit to stand pt is supervision. Standing to sitting pt requires verbal cues to slow down due to pt speeds up and loses safety awareness when fatigued.    Ambulation/Gait Ambulation/Gait  assistance: Supervision, Contact guard assist Gait Distance (Feet): 250 Feet Assistive device: Rolling walker (2 wheels), None Gait Pattern/deviations: Step-through pattern, Decreased stride length, Drifts right/left Gait velocity: decreased Gait velocity interpretation: <1.8 ft/sec, indicate of risk for recurrent falls   General Gait Details: Pt continues to drift to the R. Used visual cues this session to improve with frequent reminders working on obstacle navigation with ~50% accuracy. Pt continues to run into obstacles on the R requiring verbal/tactile cues to correct and then will over correct. Pt ambulated for short distance in the room without AD using foot plate on bed and wall. Pt was fatigued at the end of walk and started increasing speed and running into more objects. Multi modal cues for slowing down.    Modified Rankin (Stroke Patients Only) Modified Rankin (Stroke Patients Only) Pre-Morbid Rankin Score: Moderate disability Modified Rankin: Moderate disability     Balance Overall balance assessment: Needs assistance Sitting-balance support: No upper extremity supported, Feet supported Sitting balance-Leahy Scale: Good     Standing balance support: Bilateral upper extremity supported, During functional activity, Reliant on assistive device for balance Standing balance-Leahy Scale: Poor Standing balance comment: supervision for safety. Pt is reliant on UE support to prevent falls. Gets very unsafe with fatigue.        Communication Communication Communication: Impaired Factors Affecting Communication: Non - English speaking, interpreter not available  Cognition Arousal: Alert Behavior During Therapy: WFL for tasks assessed/performed   PT - Cognitive impairments: Problem solving, Safety/Judgement, Awareness, Memory     PT - Cognition Comments: pt declined interpretor; no family present to interpret Following commands: Impaired Following commands impaired: Follows  multi-step commands with increased time  Cueing Cueing Techniques: Verbal cues, Tactile cues     General Comments General comments (skin integrity, edema, etc.): HR remained in the 90's throguhout session. No signs of respiratory distress. Pt did get fatigued wanting to sit and rushing to get to chair at end of session      Pertinent Vitals/Pain Pain Assessment Pain Assessment: No/denies pain     PT Goals (current goals can now be found in the care plan section) Acute Rehab PT Goals Patient Stated Goal: home PT Goal Formulation: With patient Time For Goal Achievement: 11/24/23 Potential to Achieve Goals: Good Progress towards PT goals: Progressing toward goals    Frequency    Min 2X/week      PT Plan  Continue with current POC        AM-PAC PT "6 Clicks" Mobility   Outcome Measure  Help needed turning from your back to your side while in a flat bed without using bedrails?: A Little Help needed moving from lying on your back to sitting on the side of a flat bed without using bedrails?: A Little Help needed moving to and from a bed to a chair (including a wheelchair)?: A Little Help needed standing up from a chair using your arms (e.g., wheelchair or bedside chair)?: A Little Help needed to walk in hospital room?: A Little Help needed climbing 3-5 steps with a railing? : A Little 6 Click Score: 18    End of Session Equipment Utilized During Treatment: Gait belt Activity Tolerance: Patient tolerated treatment well;Patient limited by fatigue Patient left: in chair;with call bell/phone within reach Nurse Communication: Mobility status PT Visit Diagnosis: Other abnormalities of gait and mobility (R26.89);Unsteadiness on feet (R26.81);Difficulty in walking, not elsewhere classified (R26.2);Other symptoms and signs involving the nervous system (R29.898)     Time: 4098-1191 PT Time Calculation (min) (ACUTE ONLY): 12 min  Charges:    $Gait Training: 8-22 mins PT  General Charges $$ ACUTE PT VISIT: 1 Visit                     Harrel Carina, DPT, CLT  Acute Rehabilitation Services Office: [phone] (Secure chat preferred)    Claudia Desanctis 11/13/2023, 11:48 AM

## 2023-11-13 NOTE — TOC Transition Note (Signed)
 Transition of Care [facility]) - Discharge Note   Patient Details  Name: Julie Colon MRN: [id number] Date of Birth: 09-15-1933  Transition of Care [facility]) CM/SW Contact:  Kermit Balo, RN Phone Number: 11/13/2023, 4:23 PM   Clinical Narrative:     Family has decided to have pt discharge home. Home health arranged with [facility]. Information on the AVS. [facility] will contact her for the first home visit. No DME needs per therapy.  Daughter will transport the patient home.   Final next level of care: Home w Home Health Services Barriers to Discharge: No Barriers Identified   Patient Goals and CMS Choice   CMS Medicare.gov Compare Post Acute Care list provided to:: Patient Choice offered to / list presented to : Patient      Discharge Placement                       Discharge Plan and Services Additional resources added to the After Visit Summary for                            [facility] Arranged: PT, OT [facility] Agency: [facility] Date [facility] Agency Contacted: 11/13/23   Representative spoke with at [facility] Agency: Kandee Keen  Social Drivers of Health (SDOH) Interventions SDOH Screenings   Food Insecurity: Patient Unable To Answer (11/09/2023)  Housing: Patient Unable To Answer (11/09/2023)  Transportation Needs: No Transportation Needs (11/09/2023)  Utilities: Not At Risk (11/09/2023)  Alcohol Screen: Low Risk  (10/06/2023)  Depression (PHQ2-9): High Risk (10/06/2023)  Financial Resource Strain: Low Risk  (10/06/2023)  Physical Activity: Inactive (10/06/2023)  Social Connections: Unknown (11/09/2023)  Stress: Stress Concern Present (10/06/2023)  Tobacco Use: Medium Risk (11/08/2023)  Health Literacy: Adequate Health Literacy (10/06/2023)     Readmission Risk Interventions     No data to display

## 2023-11-13 NOTE — Inpatient Diabetes Management (Signed)
 Inpatient Diabetes Program Recommendations  AACE/ADA: New Consensus Statement on Inpatient Glycemic Control (2025)  Target Ranges:  Prepandial:   less than 140 mg/dL      Peak postprandial:   less than 180 mg/dL (1-2 hours)      Critically ill patients:  140 - 180 mg/dL   Lab Results  Component Value Date   GLUCAP 306 (H) 11/13/2023   HGBA1C 7.7 (H) 11/09/2023    Review of Glycemic Control  Latest Reference Range & Units 11/12/23 12:13 11/12/23 16:18 11/12/23 22:00 11/13/23 06:18 11/13/23 11:27  Glucose-Capillary 70 - 99 mg/dL 161 (H) 096 (H) 045 (H) 171 (H) 306 (H)   Diabetes history: DM Outpatient Diabetes medications:  Lantus 20 units daily Metformin 500 mg bid Current orders for Inpatient glycemic control:  Novolog 0-15 units tid with meals   Inpatient Diabetes Program Recommendations:    Consider adding Lantus 10 units daily (1/2 of home dose).    Thanks,  Lorenza Cambridge, RN, BC-ADM Inpatient Diabetes Coordinator Pager [phone]  (8a-5p)

## 2023-11-13 NOTE — Progress Notes (Signed)
 Inpatient Rehab Admissions Coordinator:   I spoke to pt's daughter, [name], over the phone to confirm PLOF and caregiver support.  At baseline, pt is mod I with a RW/cane but does forget to use it.  She requires assist for bathing/dressing/toileting from her daughter at baseline.  Currently, pt is ambulating 250' at supervision level, and requiring min assist to CGA for ADLs.  Since she is at her functional baseline, she does not qualify for CIR and will need to follow up with a less intensive setting.  Daughter confirms she is comfortable providing current level of assist if pt discharges directly home.  She does not feel she needs SNF rehab at this time.  I will notify TOC and MD.    Estill Dooms, PT, DPT Admissions Coordinator [phone] 11/13/23  12:24 PM

## 2023-11-13 NOTE — Progress Notes (Deleted)
 STROKE TEAM PROGRESS NOTE   INTERIM HISTORY/SUBJECTIVE Nobody is present at the bedside.  Patient states she is doing well.   She still has some right leg heaviness but it is improving.  Remains hemodynamically stable and afebrile .   No new complaints.  Vital signs stable.  Plan to discharge home, awaiting confirmation from family.   OBJECTIVE  CBC    Component Value Date/Time   WBC 7.5 11/11/2023 1040   RBC 4.08 11/11/2023 1040   HGB 12.3 11/11/2023 1040   HGB 12.9 10/06/2023 1152   HCT 37.1 11/11/2023 1040   HCT 38.2 10/06/2023 1152   PLT 267 11/11/2023 1040   PLT 366 10/06/2023 1152   MCV 90.9 11/11/2023 1040   MCV 90 10/06/2023 1152   MCH 30.1 11/11/2023 1040   MCHC 33.2 11/11/2023 1040   RDW 13.9 11/11/2023 1040   RDW 11.9 10/06/2023 1152   LYMPHSABS 2.6 11/08/2023 1954   LYMPHSABS 3.0 03/20/2020 1135   MONOABS 0.7 11/08/2023 1954   EOSABS 0.2 11/08/2023 1954   EOSABS 0.3 03/20/2020 1135   BASOSABS 0.0 11/08/2023 1954   BASOSABS 0.0 03/20/2020 1135    BMET    Component Value Date/Time   NA 136 11/11/2023 0906   NA 137 10/06/2023 1152   K 4.3 11/11/2023 0906   CL 103 11/11/2023 0906   CO2 24 11/11/2023 0906   GLUCOSE 89 11/11/2023 0906   BUN 15 11/11/2023 0906   BUN 19 10/06/2023 1152   CREATININE 0.94 11/11/2023 0906   CREATININE 1.12 (H) 04/09/2022 1422   CALCIUM 9.4 11/11/2023 0906   EGFR 66 10/06/2023 1152   GFRNONAA 59 (L) 11/11/2023 0906   GFRNONAA 48 (L) 04/09/2022 1422    IMAGING past 24 hours No results found.   Vitals:   11/13/23 0026 11/13/23 0309 11/13/23 0809 11/13/23 1121  BP: (!) 122/56 (!) 143/67 132/69 137/83  Pulse: 80 80 95 99  Resp: 18 14 16 17   Temp: 97.7 F (36.5 C) 98.4 F (36.9 C) 98.3 F (36.8 C) (!) 97.5 F (36.4 C)  TempSrc: Oral Oral Oral Oral  SpO2: 97% 99% 98% 97%  Weight:         PHYSICAL EXAM General:  Alert, well-nourished, well-developed pleasant elderly lady in no acute distress Psych:  Mood and affect  appropriate for situation CV: Regular rate and rhythm on monitor Respiratory:  Regular, unlabored respirations on room air GI: Abdomen soft and nontender   NEURO:  Mental Status: AA&Ox3, patient is able to give some history Speech/Language: speech is without dysarthria or aphasia.    Cranial Nerves:  II: PERRL. Visual fields full.  III, IV, VI: EOMI. Eyelids elevate symmetrically.  V: Sensation is intact to light touch and symmetrical to face.  VII: Face is symmetrical resting and smiling VIII: hearing intact to voice. IX, X: Phonation is normal.  XII: tongue is midline without fasciculations. Motor: full strength in bilateral upper extremities but diminished fine finger movements on the right Weakness in right lower extremity with drift.   Tone: is normal and bulk is normal Sensation- Intact to light touch bilaterally.   Coordination: FTN intact bilaterally Gait- deferred  Most Recent NIH 2     ASSESSMENT/PLAN  Julie Colon is a 89 y.o. female with history of asthma, dementia, DM, GERD, HTN and rectal prolapse who presents from home to the ED via EMS as a Code Stroke for acute onset of aphasia, right facial droop and right sided weakness.  NIH on  Admission 23  Acute Ischemic Infarct: Left basal ganglia likely due to small vessel disease  etiology: Small vessel disease Code Stroke CT head Small age-indeterminate right occipital infarct  CTA head & neck No emergent large vessel occlusion. Critical stenosis of the proximal right vertebral artery, nearly occlusive. MRI acute perforator infarcts in left basal ganglia with petechial hemorrhage 2D Echo EF 60-65%, grade 1 diastolic dysfunction, normal left atrial size, no atrial level shunt LDL 37 HgbA1c 7.1 VTE prophylaxis - SCDs aspirin 81 mg daily prior to admission, now on aspirin 81 mg daily alone due to petechial hemorrhagic transformation Therapy recommendations:  Home Disposition: Home  Hypertension Home meds:   norvasc, hydralazine, lopressor, lasix Stable Blood Pressure Goal: BP less than 180/105   Hyperlipidemia Home meds:  Crestor, resumed in hospital LDL 46, goal < 70 Continue statin at discharge  Diabetes type II Controlled Home meds:  Metformin  HgbA1c 7.7, goal < 7.0 CBGs SSI - add Lantus 10u daily  Recommend close follow-up with PCP for better DM control  Other Stroke Risk Factors Obesity, Body mass index is 28.23 kg/m., BMI >/= 30 associated with increased stroke risk, recommend weight loss, diet and exercise as appropriate   Asthma Dulera inhaler  Dementia Lives with daughter and is not typically left alone No medications   Hospital day # 5   Patient seen and examined by NP/APP with MD. MD to update note as needed.   Elmer Picker, DNP, FNP-BC [facility] Pager: [phone]    To contact Stroke Continuity provider, please refer to [email or web address]. After hours, contact General Neurology

## 2023-11-14 ENCOUNTER — Other Ambulatory Visit: Payer: Self-pay

## 2023-11-14 ENCOUNTER — Encounter: Payer: Self-pay | Admitting: Internal Medicine

## 2023-11-14 ENCOUNTER — Telehealth: Payer: Self-pay

## 2023-11-14 LAB — GLUCOSE, CAPILLARY
Glucose-Capillary: 196 mg/dL — ABNORMAL HIGH (ref 70–99)
Glucose-Capillary: 163 mg/dL — ABNORMAL HIGH (ref 70–99)
Glucose-Capillary: 181 mg/dL — ABNORMAL HIGH (ref 70–99)

## 2023-11-14 NOTE — Transitions of Care (Post Inpatient/ED Visit) (Signed)
   11/14/2023  Name: Julie Colon MRN: [id number] DOB: [birth year]  Today's TOC FU Call Status: Today's TOC FU Call Status:: Unsuccessful Call (1st Attempt) Unsuccessful Call (1st Attempt) Date: 11/14/23  Attempted to reach the patient regarding the most recent Inpatient/ED visit.  Follow Up Plan: Additional outreach attempts will be made to reach the patient to complete the Transitions of Care (Post Inpatient/ED visit) call.   Muhammadali Ries A. Mliss Fritz RN, BA, [facility], CRRN [facility] RN Care Manager, Transition of Care [phone]

## 2023-11-17 ENCOUNTER — Telehealth: Payer: Self-pay

## 2023-11-17 NOTE — Telephone Encounter (Signed)
 I do not have this.

## 2023-11-17 NOTE — Telephone Encounter (Signed)
 Forms located. [employer] is requesting OV notes supporting incontinence supply need. No recent incontinence notes available. Will evaluate need on next visit and fax OV to [employer].

## 2023-11-17 NOTE — Transitions of Care (Post Inpatient/ED Visit) (Signed)
   11/17/2023  Name: Lori-Ann Lindfors MRN: [id number] DOB: 02/15/1934  Today's TOC FU Call Status: Today's TOC FU Call Status:: Unsuccessful Call (2nd Attempt) Unsuccessful Call (2nd Attempt) Date: 11/17/23  Attempted to reach the patient regarding the most recent Inpatient/ED visit.  Follow Up Plan: Additional outreach attempts will be made to reach the patient to complete the Transitions of Care (Post Inpatient/ED visit) call.   Edinson Domeier A. Mliss Fritz RN, BA, [facility], CRRN [facility] RN Care Manager, Transition of Care [phone]

## 2023-11-18 ENCOUNTER — Other Ambulatory Visit: Payer: Self-pay

## 2023-11-18 ENCOUNTER — Encounter: Payer: Self-pay | Admitting: Internal Medicine

## 2023-11-18 ENCOUNTER — Ambulatory Visit: Attending: Internal Medicine | Admitting: Internal Medicine

## 2023-11-18 ENCOUNTER — Telehealth: Payer: Self-pay | Admitting: *Deleted

## 2023-11-18 ENCOUNTER — Other Ambulatory Visit: Payer: Self-pay | Admitting: Pharmacist

## 2023-11-18 VITALS — BP 130/74 | HR 106 | Temp 97.9°F | Ht 59.0 in | Wt 137.0 lb

## 2023-11-18 DIAGNOSIS — E1169 Type 2 diabetes mellitus with other specified complication: Secondary | ICD-10-CM

## 2023-11-18 DIAGNOSIS — Z87820 Personal history of traumatic brain injury: Secondary | ICD-10-CM | POA: Diagnosis not present

## 2023-11-18 DIAGNOSIS — E1159 Type 2 diabetes mellitus with other circulatory complications: Secondary | ICD-10-CM

## 2023-11-18 DIAGNOSIS — H04123 Dry eye syndrome of bilateral lacrimal glands: Secondary | ICD-10-CM | POA: Diagnosis not present

## 2023-11-18 DIAGNOSIS — E785 Hyperlipidemia, unspecified: Secondary | ICD-10-CM

## 2023-11-18 DIAGNOSIS — I152 Hypertension secondary to endocrine disorders: Secondary | ICD-10-CM

## 2023-11-18 DIAGNOSIS — Z794 Long term (current) use of insulin: Secondary | ICD-10-CM

## 2023-11-18 DIAGNOSIS — G252 Other specified forms of tremor: Secondary | ICD-10-CM | POA: Diagnosis not present

## 2023-11-18 DIAGNOSIS — Z09 Encounter for follow-up examination after completed treatment for conditions other than malignant neoplasm: Secondary | ICD-10-CM | POA: Diagnosis not present

## 2023-11-18 DIAGNOSIS — I10 Essential (primary) hypertension: Secondary | ICD-10-CM

## 2023-11-18 DIAGNOSIS — I6930 Unspecified sequelae of cerebral infarction: Secondary | ICD-10-CM

## 2023-11-18 LAB — GLUCOSE, POCT (MANUAL RESULT ENTRY): POC Glucose: 208 mg/dL — AB (ref 70–99)

## 2023-11-18 MED ORDER — ASPIRIN 81 MG PO TBEC: 81.0000 mg | DELAYED_RELEASE_TABLET | Freq: Every day | ORAL | Status: AC

## 2023-11-18 MED ORDER — HYPROMELLOSE (GONIOSCOPIC) 2.5 % OP SOLN
1.0000 [drp] | Freq: Every day | OPHTHALMIC | 1 refills | Status: AC | PRN
Start: 2023-11-18 — End: ?
  Filled 2023-11-18: qty 15, 150d supply, fill #0

## 2023-11-18 MED ORDER — POLYVINYL ALCOHOL 1.4 % OP SOLN
1.0000 [drp] | OPHTHALMIC | 0 refills | Status: AC | PRN
  Filled 2023-11-18: qty 15, fill #0

## 2023-11-18 MED ORDER — ROSUVASTATIN CALCIUM 10 MG PO TABS
10.0000 mg | ORAL_TABLET | Freq: Every day | ORAL | 3 refills | Status: AC
  Filled 2023-11-18 – 2023-12-31 (×2): qty 90, 90d supply, fill #0
  Filled 2024-05-04: qty 90, 90d supply, fill #1

## 2023-11-18 NOTE — Progress Notes (Signed)
 Patient ID: Julie Colon, female    DOB: [birth year]  MRN: [id number]  CC: Hospitalization Follow-up (Hospitalization & incontinence eval /Shoulder pain on both sides /Concern about delayed response to pick up feet after stroke/Already received flu vax)   Subjective: Julie Colon is a 89 y.o. female who presents for chronic ds management. Daughter [name] is with her and interprets.  Waiver form signed on previous visit. Her concerns today include:  Patient with history of , DM type II, HTN, asthma, GERD, Dementia, chronic LBP (DDD, moderate spinal stenosis on MRI 02/2023), RT LL lung nodule CT 04/2022   Patient home hospitalized 3/8-13/2025 with acute ischemic infarct involving the left basal ganglia.  Presented with acute aphasia and right-sided facial droop with right-sided weakness.  Given IV thrombolytics with TNK.  Findings of initial studies are copied below from her hospital discharge: CTA head & neck No emergent large vessel occlusion. Critical stenosis of the proximal right vertebral artery, nearly occlusive. MRI acute perforator infarcts in left basal ganglia with petechial hemorrhage 2D Echo EF 60-65%, grade 1 diastolic dysfunction, normal left atrial size, no atrial level shunt LDL 37 HgbA1c 7.1 VTE prophylaxis - SCDs aspirin 81 mg daily prior to admission, now on aspirin 81 mg daily alone due to petechial hemorrhagic transformation  Today: Daughter reports that her speech is back to normal and her strength has improved.  She states that the patient was supposed to have home physical therapy but no one has called as yet.  Patient has not had any falls.  She ambulates with her rollator walker with seat. -Should be on aspirin 81 mg but daughter states they do not have it. -Patient reports some shaking in her hands and body since hospital discharge. -She is eating and sleeping okay.  HTN: Daughter gives her her medications.  She had her medicines already for the morning.   Medicines include hydralazine 100 mg 3 times a day, metoprolol 25 mg twice a day and amlodipine 10 mg daily.  DM: A1c was 7.1.  Daughter gives her the glargine insulin 20 units daily and metformin 500 mg twice a day.  Checks blood sugars only in the mornings.  Blood sugar yesterday morning was 133.  They did not check this morning.  Patient complains of dry eyes ever since having bilateral cataract extraction.  Last extraction was done in December of last year.  Daughter states that new prescription glasses prescribed and she is waiting for her to come in.  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   Essential hypertension 03/20/2020   Hyponatremia 03/02/2020   Sepsis due to pneumonia (HCC) 03/02/2020   Noncompliance 03/02/2020   Leukocytosis 03/02/2020   Advanced age 23/09/2019   Pneumonia 03/02/2020   Hyperglycemic hyperosmolar nonketotic coma (HCC) 01/14/2020   Type 2 diabetes mellitus with complication, without long-term current use of insulin (HCC) 01/14/2020   Hyperkalemia 01/14/2020   AKI (acute kidney injury) (HCC) 01/14/2020     Current Outpatient Medications on File Prior to Visit  Medication Sig Dispense Refill   acetaminophen (TYLENOL) 325 MG tablet Take 650 mg by mouth every 8 (eight) hours.     amLODipine (NORVASC) 10 MG tablet Take 1 tablet (10 mg total) by mouth daily. 90 tablet 0   Carboxymethylcellulose Sodium (REFRESH PLUS OP) Apply to eye.  furosemide (LASIX) 20 MG tablet Take 1/2 tablet by mouth 1-2 times a week as need for swelling in legs 20 tablet 3   hydrALAZINE (APRESOLINE) 100 MG tablet Take 1 tablet (100 mg total) by mouth 3 (three) times daily. 90 tablet 0   insulin glargine (LANTUS SOLOSTAR) 100 UNIT/ML Solostar Pen Inject 20 Units into the skin daily.  15 mL 3   metFORMIN (GLUCOPHAGE) 500 MG tablet Take 1 tablet (500 mg total) by mouth 2 (two) times daily with a meal. 60 tablet 5   metoprolol tartrate (LOPRESSOR) 25 MG tablet Take 1 tablet (25 mg total) by mouth 2 (two) times daily. 60 tablet 5   mometasone-formoterol (DULERA) 100-5 MCG/ACT AERO Inhale 2 puffs into the lungs daily.     pantoprazole (PROTONIX) 20 MG tablet Take 1 tablet (20 mg total) by mouth daily. 30 tablet 3   No current facility-administered medications on file prior to visit.    No Known Allergies  Social History   Socioeconomic History   Marital status: Single    Spouse name: Not on file   Number of children: Not on file   Years of education: Not on file   Highest education level: Not on file  Occupational History   Not on file  Tobacco Use   Smoking status: Former   Smokeless tobacco: Never  Vaping Use   Vaping status: Never Used  Substance and Sexual Activity   Alcohol use: No   Drug use: No   Sexual activity: Not Currently  Other Topics Concern   Not on file  Social History Narrative   Speaks [place] (from the [place])   Moved to [facility] May 1. 2021 from [place].  Daughter not well versed in mother's care yet   Social Drivers of Health   Financial Resource Strain: Low Risk  (10/06/2023)   Overall Financial Resource Strain (CARDIA)    Difficulty of Paying Living Expenses: Not very hard  Food Insecurity: Patient Unable To Answer (11/09/2023)   Hunger Vital Sign    Worried About Running Out of Food in the Last Year: Patient unable to answer    Ran Out of Food in the Last Year: Patient unable to answer  Transportation Needs: No Transportation Needs (11/09/2023)   PRAPARE - Administrator, Civil Service (Medical): No    Lack of Transportation (Non-Medical): No  Physical Activity: Inactive (10/06/2023)   Exercise Vital Sign    Days of Exercise per Week: 0 days    Minutes of Exercise per Session: 0 min  Stress: Stress Concern Present (10/06/2023)    [employer] of Occupational Health - Occupational Stress Questionnaire    Feeling of Stress : To some extent  Social Connections: Unknown (11/09/2023)   Social Connection and Isolation Panel [NHANES]    Frequency of Communication with Friends and Family: More than three times a week    Frequency of Social Gatherings with Friends and Family: Patient unable to answer    Attends Religious Services: More than 4 times per year    Active Member of [employer] or Organizations: Yes    Attends Banker Meetings: Patient unable to answer    Marital Status: Patient unable to answer  Intimate Partner Violence: Patient Unable To Answer (11/09/2023)   Humiliation, Afraid, Rape, and Kick questionnaire    Fear of Current or Ex-Partner: Patient unable to answer    Emotionally Abused: Patient unable to answer    Physically Abused: Patient unable to answer  Sexually Abused: Patient unable to answer    Family History  Problem Relation Age of Onset   Diabetes Mother    Hypertension Mother    Colon cancer Neg Hx    Stomach cancer Neg Hx    Pancreatic cancer Neg Hx    Esophageal cancer Neg Hx    Liver disease Neg Hx     Past Surgical History:  Procedure Laterality Date   none      ROS: Review of Systems Negative except as stated above  PHYSICAL EXAM: BP 130/74   Pulse (!) 106   Temp 97.9 F (36.6 C) (Oral)   Ht 4\' 11"  (1.499 m)   Wt 137 lb (62.1 kg)   SpO2 97%   BMI 27.67 kg/m   Physical Exam  General appearance - alert, well appearing, elderly female and in no distress Mental status - answers most questions appropriately.  Patient has a little difficulty following commands at times with the neurologic exam. Neck - supple, no significant adenopathy Chest - clear to auscultation, no wheezes, rales or rhonchi, symmetric air entry Heart -heart sounds are regular rate and rhythm.  Does not sound tachycardic at the time of the exam. Extremities - trace LE edema Neurologic  exam: No facial droop noted.  Speech is not dysarthric.  She has mild decrease in grip bilaterally.  Power in upper extremities proximally and distally 4+/5 bilaterally.  Power in lower extremities 4+/5 bilaterally. She is noted to have coarse intention tremor of both hands.    Latest Ref Rng & Units 11/11/2023    9:06 AM 11/08/2023    8:01 PM 11/08/2023    7:54 PM  CMP  Glucose 70 - 99 mg/dL 89  619  509   BUN 8 - 23 mg/dL 15  19  17    Creatinine 0.44 - 1.00 mg/dL 3.26  7.12  4.58   Sodium 135 - 145 mmol/L 136  137  138   Potassium 3.5 - 5.1 mmol/L 4.3  4.5  4.5   Chloride 98 - 111 mmol/L 103  103  99   CO2 22 - 32 mmol/L 24   27   Calcium 8.9 - 10.3 mg/dL 9.4   9.5   Total Protein 6.5 - 8.1 g/dL   7.0   Total Bilirubin 0.0 - 1.2 mg/dL   0.5   Alkaline Phos 38 - 126 U/L   54   AST 15 - 41 U/L   18   ALT 0 - 44 U/L   11    Lipid Panel     Component Value Date/Time   CHOL 130 11/09/2023 0942   CHOL 136 07/07/2023 1229   TRIG 57 11/09/2023 0942   HDL 73 11/09/2023 0942   HDL 82 07/07/2023 1229   CHOLHDL 1.8 11/09/2023 0942   VLDL 11 11/09/2023 0942   LDLCALC 46 11/09/2023 0942   LDLCALC 37 07/07/2023 1229    CBC    Component Value Date/Time   WBC 7.5 11/11/2023 1040   RBC 4.08 11/11/2023 1040   HGB 12.3 11/11/2023 1040   HGB 12.9 10/06/2023 1152   HCT 37.1 11/11/2023 1040   HCT 38.2 10/06/2023 1152   PLT 267 11/11/2023 1040   PLT 366 10/06/2023 1152   MCV 90.9 11/11/2023 1040   MCV 90 10/06/2023 1152   MCH 30.1 11/11/2023 1040   MCHC 33.2 11/11/2023 1040   RDW 13.9 11/11/2023 1040   RDW 11.9 10/06/2023 1152   LYMPHSABS 2.6 11/08/2023 1954  LYMPHSABS 3.0 03/20/2020 1135   MONOABS 0.7 11/08/2023 1954   EOSABS 0.2 11/08/2023 1954   EOSABS 0.3 03/20/2020 1135   BASOSABS 0.0 11/08/2023 1954   BASOSABS 0.0 03/20/2020 1135    ASSESSMENT AND PLAN: 1. Hospital discharge follow-up (Primary)   2. History of cerebrovascular accident (CVA) with residual  deficit Stressed the importance of good blood pressure, diabetes and cholesterol control to prevent another stroke.  Prescription sent to the pharmacy for aspirin. Message sent to case worker to inquire about home health/physical therapy We will also refer her for follow-up with neurologist Dr. Pearlean Brownie - aspirin EC 81 MG tablet; Take 1 tablet (81 mg total) by mouth daily. Swallow whole. - rosuvastatin (CRESTOR) 10 MG tablet; Take 1 tablet (10 mg total) by mouth daily.  Dispense: 90 tablet; Refill: 3 - Ambulatory referral to Neurology  3. Coarse tremor Patient has intention tremors in both hands.  Advised to discuss with neurology when she is seen.  She is already on beta-blocker for blood pressure which he can sometimes help with intention tremors.  4. Hypertension associated with type 2 diabetes mellitus (HCC) At goal.  Continue hydralazine, metoprolol and amlodipine  5. Hyperlipidemia associated with type 2 diabetes mellitus (HCC) - rosuvastatin (CRESTOR) 10 MG tablet; Take 1 tablet (10 mg total) by mouth daily.  Dispense: 90 tablet; Refill: 3  6. Type 2 diabetes mellitus with other circulatory complication, with long-term current use of insulin (HCC) Patient to continue metformin 500 mg twice a day and glargine insulin 20 units daily - POCT glucose (manual entry)  7. Dry eyes - hydroxypropyl methylcellulose / hypromellose (ISOPTO TEARS / GONIOVISC) 2.5 % ophthalmic solution; Place 1 drop into both eyes daily as needed for dry eyes.  Dispense: 15 mL; Refill: 1     Patient was given the opportunity to ask questions.  Patient verbalized understanding of the plan and was able to repeat key elements of the plan.   This documentation was completed using Paediatric nurse.  Any transcriptional errors are unintentional.  Orders Placed This Encounter  Procedures   Ambulatory referral to Neurology   POCT glucose (manual entry)     Requested Prescriptions   Signed  Prescriptions Disp Refills   aspirin EC 81 MG tablet      Sig: Take 1 tablet (81 mg total) by mouth daily. Swallow whole.   hydroxypropyl methylcellulose / hypromellose (ISOPTO TEARS / GONIOVISC) 2.5 % ophthalmic solution 15 mL 1    Sig: Place 1 drop into both eyes daily as needed for dry eyes.   rosuvastatin (CRESTOR) 10 MG tablet 90 tablet 3    Sig: Take 1 tablet (10 mg total) by mouth daily.    Return for Cancel appt with Marylene Land this month.  Jonah Blue, MD, FACP

## 2023-11-18 NOTE — Transitions of Care (Post Inpatient/ED Visit) (Signed)
   11/18/2023  Name: Julie Colon MRN: [id number] DOB: 1934/08/28  Today's TOC FU Call Status: Today's TOC FU Call Status:: Unsuccessful Call (3rd Attempt) Unsuccessful Call (3rd Attempt) Date: 11/18/23  Attempted to reach the patient regarding the most recent Inpatient/ED visit. RN used [employer] [phone] Follow Up Plan: No further outreach attempts will be made at this time. We have been unable to contact the patient.  Gean Maidens BSN RN [facility] Care Management Coordinator [email or web address] Direct Dial: [phone]  Fax: [phone] Website: [email or web address]

## 2023-11-19 ENCOUNTER — Other Ambulatory Visit: Payer: Self-pay

## 2023-11-20 ENCOUNTER — Telehealth: Payer: Self-pay

## 2023-11-20 NOTE — Telephone Encounter (Signed)
 Message received from Dr Laural Benes stating that the patient has not heard from a home health agency yet.  I called [facility] and spoke to [place] who confirmed they received and the referral and she stated she will call the patient/daughter.  I confirmed the phone number that we have for the patient/daughter and Irving Burton has the correct number

## 2023-11-24 ENCOUNTER — Other Ambulatory Visit: Payer: Self-pay | Admitting: Internal Medicine

## 2023-11-24 ENCOUNTER — Other Ambulatory Visit: Payer: Self-pay

## 2023-11-24 DIAGNOSIS — I10 Essential (primary) hypertension: Secondary | ICD-10-CM

## 2023-11-25 ENCOUNTER — Other Ambulatory Visit: Payer: Self-pay

## 2023-11-25 MED ORDER — HYDRALAZINE HCL 100 MG PO TABS
100.0000 mg | ORAL_TABLET | Freq: Three times a day (TID) | ORAL | 1 refills | Status: DC
  Filled 2023-11-25: qty 270, 90d supply, fill #0
  Filled 2024-02-24: qty 270, 90d supply, fill #1

## 2023-11-26 ENCOUNTER — Inpatient Hospital Stay: Admitting: Physician Assistant

## 2023-11-26 ENCOUNTER — Other Ambulatory Visit: Payer: Self-pay

## 2023-11-28 ENCOUNTER — Encounter: Payer: 59 | Attending: Physical Medicine & Rehabilitation | Admitting: Physical Medicine & Rehabilitation

## 2023-11-28 NOTE — Progress Notes (Deleted)
 Subjective:    Patient ID: Julie Colon, female    DOB: [birth year], 89 y.o.   MRN: [id number]  HPI  Pain Inventory Average Pain {NUMBERS; 0-10:5044} Pain Right Now {NUMBERS; 0-10:5044} My pain is {PAIN DESCRIPTION:21022940}  In the last 24 hours, has pain interfered with the following? General activity {NUMBERS; 0-10:5044} Relation with others {NUMBERS; 0-10:5044} Enjoyment of life {NUMBERS; 0-10:5044} What TIME of day is your pain at its worst? {time of day:24191} Sleep (in general) {BHH GOOD/FAIR/POOR:22877}  Pain is worse with: {ACTIVITIES:21022942} Pain improves with: {PAIN IMPROVES IONG:29528413} Relief from Meds: {NUMBERS; 0-10:5044}  {MOBILITY KGM:01027253}  {FUNCTION:21022946}  {NEURO/PSYCH:21022948}  {CPRM PRIOR STUDIES:21022953}  {CPRM PHYSICIANS INVOLVED IN YOUR CARE:21022954}    Family History  Problem Relation Age of Onset   Diabetes Mother    Hypertension Mother    Colon cancer Neg Hx    Stomach cancer Neg Hx    Pancreatic cancer Neg Hx    Esophageal cancer Neg Hx    Liver disease Neg Hx    Social History   Socioeconomic History   Marital status: Single    Spouse name: Not on file   Number of children: Not on file   Years of education: Not on file   Highest education level: Not on file  Occupational History   Not on file  Tobacco Use   Smoking status: Former   Smokeless tobacco: Never  Vaping Use   Vaping status: Never Used  Substance and Sexual Activity   Alcohol use: No   Drug use: No   Sexual activity: Not Currently  Other Topics Concern   Not on file  Social History Narrative   Speaks [place] (from the [place])   Moved to [facility] May 1. 2021 from [place].  Daughter not well versed in mother's care yet   Social Drivers of Health   Financial Resource Strain: Low Risk  (10/06/2023)   Overall Financial Resource Strain (CARDIA)    Difficulty of Paying Living Expenses: Not very hard  Food Insecurity: Patient Unable To Answer  (11/09/2023)   Hunger Vital Sign    Worried About Running Out of Food in the Last Year: Patient unable to answer    Ran Out of Food in the Last Year: Patient unable to answer  Transportation Needs: No Transportation Needs (11/09/2023)   PRAPARE - Administrator, Civil Service (Medical): No    Lack of Transportation (Non-Medical): No  Physical Activity: Inactive (10/06/2023)   Exercise Vital Sign    Days of Exercise per Week: 0 days    Minutes of Exercise per Session: 0 min  Stress: Stress Concern Present (10/06/2023)   [employer] of Occupational Health - Occupational Stress Questionnaire    Feeling of Stress : To some extent  Social Connections: Unknown (11/09/2023)   Social Connection and Isolation Panel [NHANES]    Frequency of Communication with Friends and Family: More than three times a week    Frequency of Social Gatherings with Friends and Family: Patient unable to answer    Attends Religious Services: More than 4 times per year    Active Member of [employer] or Organizations: Yes    Attends Banker Meetings: Patient unable to answer    Marital Status: Patient unable to answer   Past Surgical History:  Procedure Laterality Date   none     Past Medical History:  Diagnosis Date   Asthma    Constipation    Dementia (HCC)    Diabetes mellitus  without complication (HCC)    GERD (gastroesophageal reflux disease)    Hypertension    Rectal prolapse    There were no vitals taken for this visit.  Opioid Risk Score:   Fall Risk Score:  `1  Depression screen [facility] 2/9     11/18/2023   10:19 AM 10/06/2023   12:28 PM 04/03/2023    3:24 PM 09/29/2020    9:32 AM 04/20/2020   10:04 AM 03/20/2020    9:22 AM  Depression screen PHQ 2/9  Decreased Interest 0 1 1 2  0 2  Down, Depressed, Hopeless 1 0 0 2 0 1  PHQ - 2 Score 1 1 1 4  0 3  Altered sleeping 2 3 1 3  0 0  Tired, decreased energy 3 2 2 3  0 0  Change in appetite 1 3 1 2  0 0  Feeling bad or failure about  yourself  0 0 0 2 0 0  Trouble concentrating 2 3 1 3 1  0  Moving slowly or fidgety/restless 2 0  2 1 0  Suicidal thoughts 3 0  3 0 0  PHQ-9 Score 14 12 6 22 2 3   Difficult doing work/chores Very difficult Somewhat difficult         Review of Systems     Objective:   Physical Exam        Assessment & Plan:

## 2023-12-08 ENCOUNTER — Telehealth: Payer: Self-pay | Admitting: Internal Medicine

## 2023-12-08 ENCOUNTER — Telehealth: Admitting: Internal Medicine

## 2023-12-08 NOTE — Telephone Encounter (Signed)
 Called & spoke to [place], the patient's daughter. Video visit scheduled for 12/09/2023. [name] expressed verbal understanding that appointment is a telehealth visit.

## 2023-12-08 NOTE — Telephone Encounter (Signed)
 PC placed to pt's daughter, [name], this a.m after she did not sign on for the video visit that was scheduled for 8:10 a.m.  She stated she had just gotten out of shower and thought someone was suppose to call and give her in-office appt.  She elected to reschedule this visit for later this wk. Message sent to my CMA to reschedule this appt for later this week.  Please make it clear that it is a video visit to get her approved for incontinence supplies ie Depends undergarment/adult diapers if she needs them.  Make sure daughter knows that it is a video visit and to sign in at the time of the visit.

## 2023-12-09 ENCOUNTER — Telehealth (HOSPITAL_BASED_OUTPATIENT_CLINIC_OR_DEPARTMENT_OTHER): Admitting: Internal Medicine

## 2023-12-09 ENCOUNTER — Other Ambulatory Visit: Payer: Self-pay

## 2023-12-09 DIAGNOSIS — R600 Localized edema: Secondary | ICD-10-CM

## 2023-12-09 DIAGNOSIS — N3941 Urge incontinence: Secondary | ICD-10-CM | POA: Diagnosis not present

## 2023-12-09 DIAGNOSIS — R159 Full incontinence of feces: Secondary | ICD-10-CM | POA: Diagnosis not present

## 2023-12-09 DIAGNOSIS — R3981 Functional urinary incontinence: Secondary | ICD-10-CM

## 2023-12-09 MED ORDER — FUROSEMIDE 20 MG PO TABS
10.0000 mg | ORAL_TABLET | ORAL | 3 refills | Status: DC
  Filled 2023-12-09: qty 20, fill #0
  Filled 2023-12-09: qty 12, 84d supply, fill #0

## 2023-12-09 NOTE — Progress Notes (Signed)
 Patient ID: Julie Colon, female   DOB: [birth year], 89 y.o.   MRN: [id number] Virtual Visit via Video Note  I connected with Julie Colon on 12/09/2023 at 8:16 AM by a video enabled telemedicine application and verified that I am speaking with the correct person using two identifiers.  Location: Patient: home Provider: Office   I discussed the limitations of evaluation and management by telemedicine and the availability of in person appointments. The patient expressed understanding and agreed to proceed.  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 Waiver form signed on previous visit.   This visit was for [place] to complete form for her to get incontinence supplies.  Daughter states that patient has had urinary incontinence for over a year.  She is unable to hold her urine when she gets the urge to urinate.  This is worse at nights.  She has a bedside commode.  She also has issues getting to the restroom on time now due to slower gait since CVA.  She has some fecal incontinence at times.  Daughter also states that they were unable to get the artificial eyedrops that I had prescribed.  Insurance would not cover it.  Daughter reports patient has had increased swelling in the legs over the past 2 weeks but left much greater than the right.  Left leg she states his heart but not red.  No tenderness/pain.  Patient denies any shortness of breath.  Had Doppler ultrasound done back in the fall of last year that was negative.  I had prescribed furosemide for her to take as needed.  Daughter and patient went through her med bottles on this video visit and she did not have the furosemide.  Outpatient Encounter Medications as of 12/09/2023  Medication Sig Note   acetaminophen (TYLENOL) 325 MG tablet  Take 650 mg by mouth every 8 (eight) hours. 12/06/2021: Daily for back and neck pain   amLODipine (NORVASC) 10 MG tablet Take 1 tablet (10 mg total) by mouth daily.    aspirin EC 81 MG tablet Take 1 tablet (81 mg total) by mouth daily. Swallow whole.    furosemide (LASIX) 20 MG tablet Take 1/2 tablet by mouth 1-2 times a week as need for swelling in legs    hydrALAZINE (APRESOLINE) 100 MG tablet Take 1 tablet (100 mg total) by mouth 3 (three) times daily.    hydroxypropyl methylcellulose / hypromellose (ISOPTO TEARS / GONIOVISC) 2.5 % ophthalmic solution Place 1 drop into both eyes daily as needed for dry eyes.    insulin glargine (LANTUS SOLOSTAR) 100 UNIT/ML Solostar Pen Inject 20 Units into the skin daily.    metFORMIN (GLUCOPHAGE) 500 MG tablet Take 1 tablet (500 mg total) by mouth 2 (two) times daily with a meal.    metoprolol tartrate (LOPRESSOR) 25 MG tablet Take 1 tablet (25 mg total) by mouth 2 (two) times daily.    mometasone-formoterol (DULERA) 100-5 MCG/ACT AERO Inhale 2 puffs into the lungs daily.    pantoprazole (PROTONIX) 20 MG tablet Take 1 tablet (20 mg total) by mouth daily.    polyvinyl alcohol (ARTIFICIAL TEARS) 1.4 % ophthalmic solution Place 1 drop into both eyes as needed for dry eyes.    rosuvastatin (CRESTOR) 10 MG tablet Take 1 tablet (10 mg total) by mouth daily.  No facility-administered encounter medications on file as of 12/09/2023.      Observations/Objective: Patient was able to share the screen with her daughter on this video visit.  She appeared in no acute distress.  Daughter was able to turn the camera to focus on the legs.  I instructed the daughter to press on the shin and it up.  She did have some mild pitting  Assessment and Plan: 1. Urge incontinence of urine (Primary) 2. Functional urinary incontinence 3. Incontinence of feces, unspecified fecal incontinence type Patient with history of dementia and recent CVA who has urge and functional urinary  incontinence and some fecal incontinence at times.  Daughter states that she has been getting incontinence supplies for over a year.  She needs recertification.  She needs depends undergarments, Chux and gloves.  I will sign off on form for [employer]  4. Edema of both lower legs -Advised daughter that I will send a refill on furosemide and I want her to pick it up today and give to the patient twice a week as needed for swelling in the legs.  We will recheck the Doppler ultrasound especially with her stating that the swelling in the left leg is much greater than the right.  If Doppler ultrasound is negative, we can also prescribe compression stockings.  If swelling persists, we may need to decrease the dose or stop amlodipine. - VAS [place] LOWER EXTREMITY VENOUS (DVT); Future - furosemide (LASIX) 20 MG tablet; Take 0.5 tablets (10 mg total) by mouth 2 (two) times a week as needed for swelling in legs.  Dispense: 20 tablet; Refill: 3   Follow Up Instructions: As previously scheduled   I discussed the assessment and treatment plan with the patient. The patient was provided an opportunity to ask questions and all were answered. The patient agreed with the plan and demonstrated an understanding of the instructions.   The patient was advised to call back or seek an in-person evaluation if the symptoms worsen or if the condition fails to improve as anticipated.  I spent 20 minutes dedicated to the care of this patient on the date of this encounter to include previsit review of of my last note, face-to-face time with patient and daughter discussing diagnosis and management and post visit entering of orders.  This note has been created with Education officer, environmental. Any transcriptional errors are unintentional.  Jonah Blue, MD

## 2023-12-10 ENCOUNTER — Encounter: Payer: Self-pay | Admitting: Internal Medicine

## 2023-12-10 ENCOUNTER — Ambulatory Visit (HOSPITAL_COMMUNITY)
Admission: RE | Admit: 2023-12-10 | Discharge: 2023-12-10 | Disposition: A | Discharge status: Home / Self Care | Source: Ambulatory Visit | Attending: Internal Medicine | Admitting: Internal Medicine

## 2023-12-10 DIAGNOSIS — R600 Localized edema: Secondary | ICD-10-CM | POA: Insufficient documentation

## 2023-12-10 NOTE — Progress Notes (Signed)
 Lower extremity venous duplex completed. Please see CV Procedures for preliminary results.  Shona Simpson, RVT 12/10/23 10:11 AM

## 2023-12-14 NOTE — Telephone Encounter (Signed)
 Did we send in her info/form to [employer]?

## 2023-12-22 ENCOUNTER — Other Ambulatory Visit: Payer: Self-pay | Admitting: Internal Medicine

## 2023-12-22 ENCOUNTER — Other Ambulatory Visit: Payer: Self-pay

## 2023-12-22 DIAGNOSIS — R600 Localized edema: Secondary | ICD-10-CM

## 2023-12-22 MED ORDER — FUROSEMIDE 20 MG PO TABS
20.0000 mg | ORAL_TABLET | ORAL | 3 refills | Status: AC
  Filled 2023-12-22: qty 36, fill #0
  Filled 2024-01-24 – 2024-02-24 (×2): qty 36, 84d supply, fill #0
  Filled 2024-06-02: qty 36, 84d supply, fill #1

## 2023-12-23 ENCOUNTER — Telehealth (INDEPENDENT_AMBULATORY_CARE_PROVIDER_SITE_OTHER): Payer: Self-pay | Admitting: Otolaryngology

## 2023-12-23 ENCOUNTER — Ambulatory Visit (INDEPENDENT_AMBULATORY_CARE_PROVIDER_SITE_OTHER): Payer: 59 | Admitting: Audiology

## 2023-12-23 ENCOUNTER — Telehealth: Payer: Self-pay

## 2023-12-23 ENCOUNTER — Institutional Professional Consult (permissible substitution) (INDEPENDENT_AMBULATORY_CARE_PROVIDER_SITE_OTHER): Payer: 59

## 2023-12-23 ENCOUNTER — Encounter (INDEPENDENT_AMBULATORY_CARE_PROVIDER_SITE_OTHER): Payer: Self-pay

## 2023-12-23 NOTE — Telephone Encounter (Signed)
 Copied from CRM [id number]. Topic: Referral - Status >> Dec 23, 2023 11:10 AM Zipporah Him wrote: Reason for CRM: [facility] ENT specialist calling in regard to referral from this office. Needs to speak with referral coordinator for Dr Lincoln Renshaw. States its in regard to them not having an interpreter available. Please return call to [phone].

## 2023-12-23 NOTE — Telephone Encounter (Signed)
 Called patient via Interpreter to cancel appts today with Dr. Colman Deans at 1pm and Dr. Lydia Sams at 1:30pm due to not having an Interpreter available.  LVM and sent MyChart msg.

## 2023-12-26 ENCOUNTER — Other Ambulatory Visit: Payer: Self-pay

## 2023-12-31 ENCOUNTER — Other Ambulatory Visit: Payer: Self-pay | Admitting: Physician Assistant

## 2023-12-31 ENCOUNTER — Other Ambulatory Visit: Payer: Self-pay

## 2023-12-31 DIAGNOSIS — I10 Essential (primary) hypertension: Secondary | ICD-10-CM

## 2023-12-31 MED ORDER — METOPROLOL TARTRATE 25 MG PO TABS
25.0000 mg | ORAL_TABLET | Freq: Two times a day (BID) | ORAL | 5 refills | Status: AC
  Filled 2023-12-31: qty 60, 30d supply, fill #0
  Filled 2024-02-24: qty 60, 30d supply, fill #1
  Filled 2024-05-04: qty 60, 30d supply, fill #2
  Filled 2024-06-02: qty 60, 30d supply, fill #3

## 2024-01-24 ENCOUNTER — Other Ambulatory Visit (HOSPITAL_COMMUNITY): Payer: Self-pay

## 2024-01-24 ENCOUNTER — Other Ambulatory Visit: Payer: Self-pay | Admitting: Physician Assistant

## 2024-01-24 ENCOUNTER — Other Ambulatory Visit: Payer: Self-pay | Admitting: Internal Medicine

## 2024-01-24 DIAGNOSIS — K219 Gastro-esophageal reflux disease without esophagitis: Secondary | ICD-10-CM

## 2024-01-24 DIAGNOSIS — E1165 Type 2 diabetes mellitus with hyperglycemia: Secondary | ICD-10-CM

## 2024-01-27 ENCOUNTER — Other Ambulatory Visit (HOSPITAL_COMMUNITY): Payer: Self-pay

## 2024-01-27 ENCOUNTER — Other Ambulatory Visit: Payer: Self-pay

## 2024-01-27 MED ORDER — PANTOPRAZOLE SODIUM 20 MG PO TBEC
20.0000 mg | DELAYED_RELEASE_TABLET | Freq: Every day | ORAL | 3 refills | Status: DC
  Filled 2024-01-27: qty 30, 30d supply, fill #0
  Filled 2024-02-24: qty 30, 30d supply, fill #1

## 2024-01-27 MED ORDER — METFORMIN HCL 500 MG PO TABS
500.0000 mg | ORAL_TABLET | Freq: Two times a day (BID) | ORAL | 5 refills | Status: AC
  Filled 2024-01-27: qty 60, 30d supply, fill #0
  Filled 2024-02-24: qty 60, 30d supply, fill #1
  Filled 2024-05-04: qty 60, 30d supply, fill #2
  Filled 2024-06-02: qty 60, 30d supply, fill #3

## 2024-01-28 ENCOUNTER — Other Ambulatory Visit: Payer: Self-pay

## 2024-02-23 ENCOUNTER — Telehealth: Payer: Self-pay

## 2024-02-23 NOTE — Patient Outreach (Signed)
 First telephone outreach attempt to obtain mRS. No answer. Left message for returned call.  [facility]  Population Health Care Management Assistant  Direct Dial: [phone]  Fax: [phone] Website: [email or web address]

## 2024-02-23 NOTE — Patient Outreach (Signed)
 Patient's daughter called back, obtained mRS was successfully completed. MRS= 3   Shereen Gin [facility] Health Care Management Assistant  Direct Dial: [phone]  Fax: [phone] Website: [email or web address]

## 2024-02-24 ENCOUNTER — Other Ambulatory Visit: Payer: Self-pay

## 2024-02-24 ENCOUNTER — Other Ambulatory Visit: Payer: Self-pay | Admitting: Internal Medicine

## 2024-02-24 DIAGNOSIS — I10 Essential (primary) hypertension: Secondary | ICD-10-CM

## 2024-02-24 MED ORDER — AMLODIPINE BESYLATE 10 MG PO TABS
10.0000 mg | ORAL_TABLET | Freq: Every day | ORAL | 0 refills | Status: DC
Start: 2024-02-24 — End: 2024-03-19
  Filled 2024-02-24: qty 90, 90d supply, fill #0

## 2024-03-04 ENCOUNTER — Ambulatory Visit (INDEPENDENT_AMBULATORY_CARE_PROVIDER_SITE_OTHER): Admitting: Otolaryngology

## 2024-03-04 ENCOUNTER — Ambulatory Visit: Payer: Self-pay

## 2024-03-04 ENCOUNTER — Ambulatory Visit (INDEPENDENT_AMBULATORY_CARE_PROVIDER_SITE_OTHER): Admitting: Audiology

## 2024-03-04 ENCOUNTER — Telehealth (INDEPENDENT_AMBULATORY_CARE_PROVIDER_SITE_OTHER): Payer: Self-pay | Admitting: Otolaryngology

## 2024-03-04 ENCOUNTER — Encounter (INDEPENDENT_AMBULATORY_CARE_PROVIDER_SITE_OTHER): Payer: Self-pay

## 2024-03-04 ENCOUNTER — Encounter (INDEPENDENT_AMBULATORY_CARE_PROVIDER_SITE_OTHER): Payer: Self-pay | Admitting: Otolaryngology

## 2024-03-04 VITALS — BP 128/77 | HR 86 | Ht <= 58 in | Wt 140.0 lb

## 2024-03-04 DIAGNOSIS — H903 Sensorineural hearing loss, bilateral: Secondary | ICD-10-CM | POA: Diagnosis not present

## 2024-03-04 DIAGNOSIS — H6993 Unspecified Eustachian tube disorder, bilateral: Secondary | ICD-10-CM

## 2024-03-04 DIAGNOSIS — H938X3 Other specified disorders of ear, bilateral: Secondary | ICD-10-CM

## 2024-03-04 MED ORDER — FLONASE SENSIMIST 27.5 MCG/SPRAY NA SUSP: 2.0000 | Freq: Every day | NASAL | 12 refills | Status: AC

## 2024-03-04 NOTE — Progress Notes (Signed)
  [address], [address] [place], [place] [address] [phone]  Audiological Evaluation    Name: Julie Colon     DOB:   1933/09/21      MRN:   [id number]                                                                                     Service Date: 03/04/2024     Accompanied by: daughter and granddaughter in law   Patient comes today after Dr. Tobie, ENT sent a referral for a hearing evaluation due to concerns with hearing loss. Daughter Layne) interpreted for [employer]. No [place] interpreter available.   Symptoms Yes Details  Hearing loss  [x]  Daughter reports that recently she has noticed Hazell struggles hearing her.  Tinnitus  []    Ear pain/ infections/pressure  []  unclear  Balance problems  []    Noise exposure history  []    Previous ear surgeries  []    Family history of hearing loss  []    Amplification  []    Other  [x]  Daughter and daughter in law report that she has some dementia. Had a stroke several months ago.    Otoscopy: Right ear: Non-occluding cerumen, able to visualize some tympanic membrane landmarks. Left ear:  Non-occluding cerumen, able to visualize some tympanic membrane landmarks.  Tympanometry: Right ear: Type C- Normal external ear canal volume with negative middle ear pressure and normal tympanic membrane compliance. Left ear: Type C- Normal external ear canal volume with negative middle ear pressure and normal tympanic membrane compliance.    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 Left ear-  Borderline normal to profound essentially  sensorineural hearing loss from 250 Hz - 8000 Hz.  Speech Audiometry (using pictureboard due to limited English proficiency): Right ear- Speech Reception Threshold (SRT) was obtained at 30 dBHL. Left ear-Speech Reception Threshold (SRT) was obtained at 30 dBHL.   Word Recognition Score Could not test due to language barrier.   The  hearing test results were completed under headphones and re-checked with inserts and results are deemed to be of poor reliability. Test technique:  conventional    Recommendations: Follow up with ENT as scheduled for today.  Return for a hearing evaluation if concerns with hearing changes arise or per MD recommendation. Recommend repeat audiogram to improve reliability , and pending those results a hearing aid recommendation may be placed.   Susy Placzek MARIE LEROUX-MARTINEZ, AUD

## 2024-03-04 NOTE — Telephone Encounter (Signed)
 There is not an In Person interpreter available for today's appointment.  Dr. Tiney and Dr. Tobie have agreed to allow someone with the patient (family member/friend) interpret for her.  There is a form that will have to be completed and signed at check in stating that someone other than [employer] is going to be interpreting for the patient.  I called the patient via Interpreter and LVM explaining this and requested a call back to confirm the patient is able to bring someone that can interpret.  Also sent a MyChart msg

## 2024-03-04 NOTE — Telephone Encounter (Signed)
 FYI Only or Action Required?: FYI only for provider.  Patient was last seen in primary care on 12/09/2023 by Vicci Barnie NOVAK, MD. Called Nurse Triage reporting Leg Swelling. Symptoms began several months ago. Interventions attempted: Prescription medications: lasix . Symptoms are: gradually worsening.  Triage Disposition: See Physician Within 24 Hours  Patient/caregiver understands and will follow disposition?: YesCopied from CRM [id number]. Topic: Clinical - Red Word Triage >> Mar 04, 2024  1:39 PM DeAngela L wrote: Red Word that prompted transfer to Nurse Triage: patient has swelling and itching of the legs and the right leg has turned black Patient is diabetic this an on going concern she was prescribed pills and not helped and now she has the blackness on her leg  Almetta daughter [phone] Reason for Disposition  [1] MODERATE leg swelling (e.g., swelling extends up to knees) AND [2] new-onset or worsening  Answer Assessment - Initial Assessment Questions 1. ONSET: When did the swelling start? (e.g., minutes, hours, days)     Several months 2. LOCATION: What part of the leg is swollen?  Are both legs swollen or just one leg?     Both-foot to knee 3. SEVERITY: How bad is the swelling? (e.g., localized; mild, moderate, severe)   - Localized: Small area of swelling localized to one leg.   - MILD pedal edema: Swelling limited to foot and ankle, pitting edema < 1/4 inch (6 mm) deep, rest and elevation eliminate most or all swelling.   - MODERATE edema: Swelling of lower leg to knee, pitting edema > 1/4 inch (6 mm) deep, rest and elevation only partially reduce swelling.   - SEVERE edema: Swelling extends above knee, facial or hand swelling present.      Not sure 4. REDNESS: Does the swelling look red or infected?     Right- turned black 5. PAIN: Is the swelling painful to touch? If Yes, ask: How painful is it?   (Scale 1-10; mild, moderate or severe)     yes 6. FEVER: Do you  have a fever? If Yes, ask: What is it, how was it measured, and when did it start?      Not sure 7. CAUSE: What do you think is causing the leg swelling?     Not sure 8. MEDICAL HISTORY: Do you have a history of blood clots (e.g., DVT), cancer, heart failure, kidney disease, or liver failure?     Not sure 9. RECURRENT SYMPTOM: Have you had leg swelling before? If Yes, ask: When was the last time? What happened that time?     yes 10. OTHER SYMPTOMS: Do you have any other symptoms? (e.g., chest pain, difficulty breathing)       Denies all   Daughter Almetta called with concerns of blackness on right knee. Pt has been dealing with bilateral leg swelling for months. She stated it Looks like a bruise. Pt uses walker to walk. No falls or known injuries. Pt wont let [name] touch  legs. Denies SOB. RN advised to take pt to UC today. Almetta stated she will take her.  Protocols used: Leg Swelling and Edema-A-AH

## 2024-03-04 NOTE — Patient Instructions (Signed)
 Use flonase spray two sprays each nostril two times per day.

## 2024-03-04 NOTE — Telephone Encounter (Signed)
FYI Pt has been scheduled  

## 2024-03-04 NOTE — Progress Notes (Signed)
 Dear Dr. Vicci, Here is my assessment for our mutual patient, Julie Colon. Thank you for allowing me the opportunity to care for your patient. Please do not hesitate to contact me should you have any other questions. Sincerely, Dr. Eldora Blanch  Otolaryngology Clinic Note Referring provider: Dr. Vicci HPI:  Julie Colon is a 89 y.o. female kindly referred by Dr. Vicci for evaluation of bilateral hearing loss.   Initial visit (03/2024): Family brings and translates as no In person or virtual interpreter available Patient reports: noted hearing loss, worsening, ongoing for several years but worse over past few months. Does have some bilateral ear fullness which started after the stroke with ears feeling heavy. Denies sinonasal complaints. No ear pain, other otologic complaints. No history of ear infections. No tinnitus. History difficult to obtain due to language barrier Patient denies: ear pain, fullness, vertigo, drainage Patient additionally denies: deep pain in ear canal, eustachian tube symptoms such as popping/crackling, sensitive to pressure changes Prior ear surgery: no  Personal or FHx of bleeding dz or anesthesia difficulty: no  AP/AC: ASA 81  Tobacco: quit  PMHx: DM, HTN, Asthma, GERD, Dementia, Back Pain, Memory impairment, CVA  Independent Review of Additional Tests or Records:  Dr. Vicci (10/06/2023): daughter reports problem hearing, chronic; Dx: Decreased hearing; Rx: ref to ENT CBC and BMP 11/11/2023: WBC 7.5; BUN/Cr 15/0.94 MRI Brain 11/09/2023 interpreted independently with respect to ears: bilateral mastoid effusions; no retrocochlear lesions CT Head 11/08/2023 independently interpreted with respect to ears: left mastoid effusions; cuts thick so suboptimal eval but no noted otic capsule or ossicular chain pathology 03/04/2024 Audiogram was independently reviewed and interpreted by me and it reveals - poot reliability (no interpreter available) but mod upsloping to  mild presumed SNHL (could not mask) then downsloping to severe with some asymmetry at 4K-8K Hz; SRT unreliable - 30dB?; C/C tympsf  SNHL= Sensorineural hearing loss   PMH/Meds/All/SocHx/FamHx/ROS:   Past Medical History:  Diagnosis Date   Asthma    Constipation    Dementia (HCC)    Diabetes mellitus without complication (HCC)    GERD (gastroesophageal reflux disease)    Hypertension    Rectal prolapse      Past Surgical History:  Procedure Laterality Date   none      Family History  Problem Relation Age of Onset   Diabetes Mother    Hypertension Mother    Colon cancer Neg Hx    Stomach cancer Neg Hx    Pancreatic cancer Neg Hx    Esophageal cancer Neg Hx    Liver disease Neg Hx      Social Connections: Unknown (11/09/2023)   Social Connection and Isolation Panel    Frequency of Communication with Friends and Family: More than three times a week    Frequency of Social Gatherings with Friends and Family: Patient unable to answer    Attends Religious Services: More than 4 times per year    Active Member of Clubs or Organizations: Yes    Attends Banker Meetings: Patient unable to answer    Marital Status: Patient unable to answer      Current Outpatient Medications:    amLODipine  (NORVASC ) 10 MG tablet, Take 1 tablet (10 mg total) by mouth daily., Disp: 90 tablet, Rfl: 0   aspirin  EC 81 MG tablet, Take 1 tablet (81 mg total) by mouth daily. Swallow whole., Disp: , Rfl:    fluticasone (FLONASE SENSIMIST) 27.5 MCG/SPRAY nasal spray, Place 2 sprays into the nose  daily., Disp: 10 g, Rfl: 12   furosemide  (LASIX ) 20 MG tablet, Take 1 tablet (20 mg total) by mouth 2-3 times a week as needed for swelling in the legs., Disp: 36 tablet, Rfl: 3   hydrALAZINE  (APRESOLINE ) 100 MG tablet, Take 1 tablet (100 mg total) by mouth 3 (three) times daily., Disp: 270 tablet, Rfl: 1   hydroxypropyl methylcellulose / hypromellose (ISOPTO TEARS / GONIOVISC) 2.5 % ophthalmic  solution, Place 1 drop into both eyes daily as needed for dry eyes., Disp: 15 mL, Rfl: 1   insulin  glargine (LANTUS  SOLOSTAR) 100 UNIT/ML Solostar Pen, Inject 20 Units into the skin daily., Disp: 15 mL, Rfl: 3   metFORMIN  (GLUCOPHAGE ) 500 MG tablet, Take 1 tablet (500 mg total) by mouth 2 (two) times daily with a meal., Disp: 60 tablet, Rfl: 5   metoprolol  tartrate (LOPRESSOR ) 25 MG tablet, Take 1 tablet (25 mg total) by mouth 2 (two) times daily., Disp: 60 tablet, Rfl: 5   mometasone-formoterol (DULERA) 100-5 MCG/ACT AERO, Inhale 2 puffs into the lungs daily., Disp: , Rfl:    pantoprazole  (PROTONIX ) 20 MG tablet, Take 1 tablet (20 mg total) by mouth daily., Disp: 30 tablet, Rfl: 3   polyvinyl alcohol  (ARTIFICIAL TEARS) 1.4 % ophthalmic solution, Place 1 drop into both eyes as needed for dry eyes., Disp: 15 mL, Rfl: 0   rosuvastatin  (CRESTOR ) 10 MG tablet, Take 1 tablet (10 mg total) by mouth daily., Disp: 90 tablet, Rfl: 3   acetaminophen  (TYLENOL ) 325 MG tablet, Take 650 mg by mouth every 8 (eight) hours. (Patient not taking: Reported on 03/04/2024), Disp: , Rfl:    Physical Exam:   BP 128/77 (BP Location: Right Arm, Patient Position: Sitting, Cuff Size: Normal)   Pulse 86   Ht 4' 5 (1.346 m)   Wt 140 lb (63.5 kg)   SpO2 94%   BMI 35.04 kg/m   Salient findings:  CN II-XII intact Given history and complaints, ear microscopy was indicated and performed for evaluation with findings as below in physical exam section and in procedures; Bilateral EAC clear and TM intact with well pneumatized middle ear spaces Weber 512: unable to get consistent results Rinne 512: AC > BC b/l  Anterior rhinoscopy: Septum intact; bilateral inferior turbinates without significant hypertrophy No lesions of oral cavity/oropharynx No obviously palpable neck masses No respiratory distress or stridor  Seprately Identifiable Procedures:  Prior to initiating any procedures, risks/benefits/alternatives were explained  to the patient and verbal consent obtained. Procedure: Bilateral ear microscopy using microscope (CPT [id number]) Pre-procedure diagnosis: hearing loss, bilateral Post-procedure diagnosis: same Indication: see above; given patient's otologic complaints and history, for improved and comprehensive examination of external ear and tympanic membrane, bilateral otologic examination using microscope was performed. Prior to proceeding, verbal consent was obtained after discussion of R/B/A  Procedure: Patient was placed semi-recumbent. Both ear canals were examined using the microscope with findings above. Patient tolerated the procedure well.  Impression & Plans:  Julie Colon is a 89 y.o. female with:  1. Sensorineural hearing loss (SNHL) of both ears   2. Sensation of fullness in both ears   3. ETD (Eustachian tube dysfunction), bilateral    Noted presumed b/l SNHL with sensation of fullness. C/C tymps with prior mastoid effusion; Likely some component of ETD though no effusion today, so suspect HL is SNHL. We discussed options and makes most sense to start with medical mgmt  - Start flonase BID - autoinsufflate ears (shown how) - f/u in 6 months  with audio; cleared for hearing aids - they will contact insurance to see if have HA benefit.  See below regarding exact medications prescribed this encounter including dosages and route: Meds ordered this encounter  Medications   fluticasone (FLONASE SENSIMIST) 27.5 MCG/SPRAY nasal spray    Sig: Place 2 sprays into the nose daily.    Dispense:  10 g    Refill:  12      Thank you for allowing me the opportunity to care for your patient. Please do not hesitate to contact me should you have any other questions.  Sincerely, Eldora Blanch, MD Otolaryngologist (ENT), [facility] Phone: [phone] Fax: [phone]  03/04/2024, 2:29 PM   MDM:  Level 4 - [phone] Complexity/Problems addressed: mod - chronic problem, worsening Data  complexity: mod - independent interpretation of imaging; review of notes, labs, test - Morbidity: mod  - Drug prescribed or managed: y

## 2024-03-15 ENCOUNTER — Encounter: Payer: Self-pay | Admitting: Audiology

## 2024-03-19 ENCOUNTER — Emergency Department (HOSPITAL_BASED_OUTPATIENT_CLINIC_OR_DEPARTMENT_OTHER)
Admission: EM | Admit: 2024-03-19 | Discharge: 2024-03-19 | Disposition: A | Discharge status: Home / Self Care | Attending: Emergency Medicine | Admitting: Emergency Medicine

## 2024-03-19 ENCOUNTER — Encounter (HOSPITAL_BASED_OUTPATIENT_CLINIC_OR_DEPARTMENT_OTHER): Payer: Self-pay

## 2024-03-19 ENCOUNTER — Encounter: Payer: Self-pay | Admitting: Internal Medicine

## 2024-03-19 ENCOUNTER — Ambulatory Visit: Attending: Internal Medicine | Admitting: Internal Medicine

## 2024-03-19 ENCOUNTER — Emergency Department (HOSPITAL_BASED_OUTPATIENT_CLINIC_OR_DEPARTMENT_OTHER)

## 2024-03-19 ENCOUNTER — Other Ambulatory Visit (HOSPITAL_BASED_OUTPATIENT_CLINIC_OR_DEPARTMENT_OTHER): Payer: Self-pay

## 2024-03-19 ENCOUNTER — Other Ambulatory Visit: Payer: Self-pay

## 2024-03-19 VITALS — BP 161/81 | HR 106 | Temp 98.2°F | Ht <= 58 in | Wt 136.0 lb

## 2024-03-19 DIAGNOSIS — K828 Other specified diseases of gallbladder: Secondary | ICD-10-CM | POA: Diagnosis not present

## 2024-03-19 DIAGNOSIS — M5416 Radiculopathy, lumbar region: Secondary | ICD-10-CM

## 2024-03-19 DIAGNOSIS — Z79899 Other long term (current) drug therapy: Secondary | ICD-10-CM | POA: Diagnosis not present

## 2024-03-19 DIAGNOSIS — K8020 Calculus of gallbladder without cholecystitis without obstruction: Secondary | ICD-10-CM | POA: Diagnosis not present

## 2024-03-19 DIAGNOSIS — R109 Unspecified abdominal pain: Secondary | ICD-10-CM | POA: Diagnosis not present

## 2024-03-19 DIAGNOSIS — Z794 Long term (current) use of insulin: Secondary | ICD-10-CM | POA: Insufficient documentation

## 2024-03-19 DIAGNOSIS — E1159 Type 2 diabetes mellitus with other circulatory complications: Secondary | ICD-10-CM

## 2024-03-19 DIAGNOSIS — I10 Essential (primary) hypertension: Secondary | ICD-10-CM | POA: Insufficient documentation

## 2024-03-19 DIAGNOSIS — R112 Nausea with vomiting, unspecified: Secondary | ICD-10-CM | POA: Insufficient documentation

## 2024-03-19 DIAGNOSIS — K449 Diaphragmatic hernia without obstruction or gangrene: Secondary | ICD-10-CM | POA: Diagnosis not present

## 2024-03-19 DIAGNOSIS — Z7984 Long term (current) use of oral hypoglycemic drugs: Secondary | ICD-10-CM | POA: Diagnosis not present

## 2024-03-19 DIAGNOSIS — E119 Type 2 diabetes mellitus without complications: Secondary | ICD-10-CM | POA: Insufficient documentation

## 2024-03-19 DIAGNOSIS — G8929 Other chronic pain: Secondary | ICD-10-CM | POA: Diagnosis not present

## 2024-03-19 DIAGNOSIS — F0390 Unspecified dementia without behavioral disturbance: Secondary | ICD-10-CM | POA: Insufficient documentation

## 2024-03-19 DIAGNOSIS — Z7982 Long term (current) use of aspirin: Secondary | ICD-10-CM | POA: Diagnosis not present

## 2024-03-19 DIAGNOSIS — R600 Localized edema: Secondary | ICD-10-CM | POA: Diagnosis not present

## 2024-03-19 DIAGNOSIS — E1165 Type 2 diabetes mellitus with hyperglycemia: Secondary | ICD-10-CM

## 2024-03-19 DIAGNOSIS — R000 Tachycardia, unspecified: Secondary | ICD-10-CM | POA: Diagnosis not present

## 2024-03-19 LAB — COMPREHENSIVE METABOLIC PANEL WITH GFR
ALT: 11 U/L (ref 0–44)
AST: 21 U/L (ref 15–41)
Albumin: 3.9 g/dL (ref 3.5–5.0)
Alkaline Phosphatase: 76 U/L (ref 38–126)
Anion gap: 10 (ref 5–15)
BUN: 13 mg/dL (ref 8–23)
CO2: 28 mmol/L (ref 22–32)
Calcium: 9.7 mg/dL (ref 8.9–10.3)
Chloride: 100 mmol/L (ref 98–111)
Creatinine, Ser: 1.11 mg/dL — ABNORMAL HIGH (ref 0.44–1.00)
GFR, Estimated: 48 mL/min — ABNORMAL LOW (ref >60)
Glucose, Bld: 287 mg/dL — ABNORMAL HIGH (ref 70–99)
Potassium: 5.3 mmol/L — ABNORMAL HIGH (ref 3.5–5.1)
Sodium: 137 mmol/L (ref 135–145)
Total Bilirubin: 0.3 mg/dL (ref 0.0–1.2)
Total Protein: 7.5 g/dL (ref 6.5–8.1)

## 2024-03-19 LAB — POCT GLYCOSYLATED HEMOGLOBIN (HGB A1C): HbA1c, POC (controlled diabetic range): 8.7 % — AB (ref 0.0–7.0)

## 2024-03-19 LAB — CBC
HCT: 35.4 % — ABNORMAL LOW (ref 36.0–46.0)
Hemoglobin: 11.3 g/dL — ABNORMAL LOW (ref 12.0–15.0)
MCH: 29.4 pg (ref 26.0–34.0)
MCHC: 31.9 g/dL (ref 30.0–36.0)
MCV: 92.2 fL (ref 80.0–100.0)
Platelets: 316 K/uL (ref 150–400)
RBC: 3.84 MIL/uL — ABNORMAL LOW (ref 3.87–5.11)
RDW: 13.8 % (ref 11.5–15.5)
WBC: 9.4 K/uL (ref 4.0–10.5)
nRBC: 0 % (ref 0.0–0.2)

## 2024-03-19 LAB — URINALYSIS, ROUTINE W REFLEX MICROSCOPIC
Bacteria, UA: NONE SEEN
Bilirubin Urine: NEGATIVE
Glucose, UA: NEGATIVE mg/dL
Hgb urine dipstick: NEGATIVE
Ketones, ur: NEGATIVE mg/dL
Nitrite: NEGATIVE
Protein, ur: 30 mg/dL — AB
Specific Gravity, Urine: 1.015 (ref 1.005–1.030)
pH: 6 (ref 5.0–8.0)

## 2024-03-19 LAB — GLUCOSE, POCT (MANUAL RESULT ENTRY): POC Glucose: 185 mg/dL — AB (ref 70–99)

## 2024-03-19 LAB — LIPASE, BLOOD: Lipase: 15 U/L (ref 11–51)

## 2024-03-19 MED ORDER — LIDOCAINE VISCOUS HCL 2 % MT SOLN
15.0000 mL | Freq: Once | OROMUCOSAL | Status: AC
  Administered 2024-03-19: 15 mL via OROMUCOSAL
  Filled 2024-03-19: qty 15

## 2024-03-19 MED ORDER — HYDRALAZINE HCL 100 MG PO TABS
100.0000 mg | ORAL_TABLET | Freq: Three times a day (TID) | ORAL | 1 refills | Status: AC
  Filled 2024-03-19 – 2024-06-02 (×2): qty 270, 90d supply, fill #0

## 2024-03-19 MED ORDER — PANTOPRAZOLE SODIUM 20 MG PO TBEC
20.0000 mg | DELAYED_RELEASE_TABLET | Freq: Every day | ORAL | 0 refills | Status: DC
  Filled 2024-03-19: qty 14, 14d supply, fill #0

## 2024-03-19 MED ORDER — TRAMADOL HCL 50 MG PO TABS
50.0000 mg | ORAL_TABLET | Freq: Two times a day (BID) | ORAL | 0 refills | Status: DC | PRN
  Filled 2024-03-19 (×2): qty 15, 7d supply, fill #0

## 2024-03-19 MED ORDER — ALUM & MAG HYDROXIDE-SIMETH 200-200-20 MG/5ML PO SUSP
30.0000 mL | Freq: Once | ORAL | Status: AC
  Administered 2024-03-19: 30 mL via ORAL
  Filled 2024-03-19: qty 30

## 2024-03-19 MED ORDER — SODIUM CHLORIDE 0.9 % IV BOLUS
1000.0000 mL | Freq: Once | INTRAVENOUS | Status: AC
  Administered 2024-03-19: 1000 mL via INTRAVENOUS

## 2024-03-19 MED ORDER — IOHEXOL 300 MG/ML  SOLN
100.0000 mL | Freq: Once | INTRAMUSCULAR | Status: AC | PRN
Start: 2024-03-19 — End: 2024-03-19
  Administered 2024-03-19: 100 mL via INTRAVENOUS

## 2024-03-19 MED ORDER — VALSARTAN 40 MG PO TABS
40.0000 mg | ORAL_TABLET | Freq: Every day | ORAL | 3 refills | Status: AC
  Filled 2024-03-19: qty 30, 30d supply, fill #0
  Filled 2024-03-19: qty 90, 90d supply, fill #0
  Filled 2024-05-04: qty 90, 90d supply, fill #1
  Filled 2024-08-04: qty 90, 90d supply, fill #2

## 2024-03-19 MED ORDER — ONDANSETRON HCL 4 MG/2ML IJ SOLN
4.0000 mg | Freq: Once | INTRAMUSCULAR | Status: AC
  Administered 2024-03-19: 4 mg via INTRAVENOUS
  Filled 2024-03-19: qty 2

## 2024-03-19 MED ORDER — SUCRALFATE 1 G PO TABS
1.0000 g | ORAL_TABLET | Freq: Three times a day (TID) | ORAL | 0 refills | Status: DC
  Filled 2024-03-19: qty 56, 14d supply, fill #0

## 2024-03-19 NOTE — Patient Instructions (Signed)
 Please go to the emergency room to be seen for the abdominal pain.  For your lower back pain, I have referred you again to our pain management specialist.  They will call you to schedule.  In the meantime, I have prescribed a medication called tramadol to use as needed.  It is a controlled substance/a narcotic medication and can cause drowsiness.  If it causes increased drowsiness, please stop the medicine.  Stop amlodipine  10 mg daily.  This may be causing or contributing to the swelling in the legs.  Continue hydralazine  and metoprolol .  We have added another blood pressure medicine called valsartan 40 mg daily instead.

## 2024-03-19 NOTE — Progress Notes (Signed)
 Patient ID: Julie Colon, female    DOB: 13-Feb-1934  MRN: [id number]  CC: Diabetes (DM f/u. RUDIE saw pt yesterday, stomach pain - brought ECG results/Yes to shingles vax)   Subjective: Julie Colon is a 89 y.o. female who presents for chronic ds management. Daughter Almetta is with her and interprets. Waiver form signed on previous visit.  Her concerns today include:  Patient with history of , DM type II, HTN, asthma, GERD, Dementia, chronic LBP (DDD, moderate spinal stenosis on MRI 02/2023), RT LL lung nodule CT 04/2022     Discussed the use of AI scribe software for clinical note transcription with the patient, who gave verbal consent to proceed.  History of Present Illness Julie Colon is an 89 year old female with hypertension and diabetes who presents with stomach pain and vomiting. She is accompanied by her daughter Almetta, who interprets, and her granddaughter.  She has experienced stomach pain for two days, which worsened after breakfast yesterday, leading to vomiting. The pain is located in the center of her abdomen, around the umbilical area, and is rated as a 5 out of 10 in severity. It is intermittent, lasting only a few seconds at a time. She vomited twice and called for help, prompting her daughter to call EMS. No diarrhea, burning during urination, or fever.  She continues to experience chronic back pain, located in the middle of her lower back, sometimes radiating down to her legs. Also reports  cramping in the legs when sitting for extended periods. She takes Tylenol  as needed, sometimes twice a day, but it does not alleviate the pain.  She was previously referred to pain management but no-showed the appt. Had MRI lumbar spine last follow that showed: IMPRESSION: 1. Marrow edema at the L5 superior endplate, likely reactive to chronic degeneration and remote superior endplate fracture when correlated with recent CT. 2. 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 There is mild to moderate spinal stenosis at L2-3 and L3-4. 3. 7 mm endometrial thickness is notable for age, correlate for abnormal uterine bleeding.  HTN: Her blood pressure was elevated today, and she has not yet taken her blood pressure medications. She should be on hydralazine  100 mg 3 times a day, metoprolol  25 mg twice a day and Norvasc  10 mg daily. Her blood pressure at home has been variable, with a recent reading of 187/72, but typically it is around 120/80. She limits salt intake and uses a home device to monitor her blood pressure.  She has swelling in both legs, which persists despite taking furosemide  20 mg twice a week.   DM: Lab Results  Component Value Date   HGBA1C 8.7 (A) 03/19/2024  BS 185 Her blood sugar levels are monitored every two days, with a recent reading of 285. She takes 20 units of insulin  and metformin  twice a day.    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   Essential hypertension 03/20/2020   Hyponatremia 03/02/2020   Sepsis due to pneumonia (HCC) 03/02/2020   Noncompliance 03/02/2020   Leukocytosis 03/02/2020   Advanced age 01/01/2020   Pneumonia 03/02/2020   Hyperglycemic hyperosmolar nonketotic coma (HCC) 01/14/2020   Type 2 diabetes mellitus with complication, without long-term current use of insulin  (HCC) 01/14/2020   Hyperkalemia 01/14/2020   AKI (acute kidney injury) (  HCC) 01/14/2020     Current Outpatient Medications on File Prior to Visit  Medication Sig Dispense Refill   acetaminophen  (TYLENOL ) 325 MG tablet Take 650 mg by mouth every 8 (eight) hours.     aspirin  EC 81 MG tablet Take 1 tablet (81 mg total) by mouth daily. Swallow whole.     fluticasone (FLONASE  SENSIMIST) 27.5 MCG/SPRAY nasal spray Place 2  sprays into the nose daily. 10 g 12   furosemide  (LASIX ) 20 MG tablet Take 1 tablet (20 mg total) by mouth 2-3 times a week as needed for swelling in the legs. 36 tablet 3   hydroxypropyl methylcellulose / hypromellose (ISOPTO TEARS / GONIOVISC) 2.5 % ophthalmic solution Place 1 drop into both eyes daily as needed for dry eyes. 15 mL 1   insulin  glargine (LANTUS  SOLOSTAR) 100 UNIT/ML Solostar Pen Inject 20 Units into the skin daily. 15 mL 3   metFORMIN  (GLUCOPHAGE ) 500 MG tablet Take 1 tablet (500 mg total) by mouth 2 (two) times daily with a meal. 60 tablet 5   metoprolol  tartrate (LOPRESSOR ) 25 MG tablet Take 1 tablet (25 mg total) by mouth 2 (two) times daily. 60 tablet 5   mometasone-formoterol (DULERA) 100-5 MCG/ACT AERO Inhale 2 puffs into the lungs daily.     polyvinyl alcohol  (ARTIFICIAL TEARS) 1.4 % ophthalmic solution Place 1 drop into both eyes as needed for dry eyes. 15 mL 0   rosuvastatin  (CRESTOR ) 10 MG tablet Take 1 tablet (10 mg total) by mouth daily. 90 tablet 3   No current facility-administered medications on file prior to visit.    No Known Allergies  Social History   Socioeconomic History   Marital status: Single    Spouse name: Not on file   Number of children: Not on file   Years of education: Not on file   Highest education level: Not on file  Occupational History   Not on file  Tobacco Use   Smoking status: Former   Smokeless tobacco: Never  Vaping Use   Vaping status: Never Used  Substance and Sexual Activity   Alcohol  use: No   Drug use: No   Sexual activity: Not Currently  Other Topics Concern   Not on file  Social History Narrative   Speaks [place] (from the [place])   Moved to [facility] May 1. 2021 from [place] .  Daughter not well versed in mother's care yet   Social Drivers of Health   Financial Resource Strain: Low Risk  (10/06/2023)   Overall Financial Resource Strain (CARDIA)    Difficulty of Paying Living Expenses: Not very hard  Food  Insecurity: Patient Unable To Answer (11/09/2023)   Hunger Vital Sign    Worried About Running Out of Food in the Last Year: Patient unable to answer    Ran Out of Food in the Last Year: Patient unable to answer  Transportation Needs: No Transportation Needs (11/09/2023)   PRAPARE - Administrator, Civil Service (Medical): No    Lack of Transportation (Non-Medical): No  Physical Activity: Inactive (10/06/2023)   Exercise Vital Sign    Days of Exercise per Week: 0 days    Minutes of Exercise per Session: 0 min  Stress: Stress Concern Present (10/06/2023)   [employer] of Occupational Health - Occupational Stress Questionnaire    Feeling of Stress : To some extent  Social Connections: Unknown (11/09/2023)   Social Connection and Isolation Panel    Frequency of Communication with Friends and Family: More  than three times a week    Frequency of Social Gatherings with Friends and Family: Patient unable to answer    Attends Religious Services: More than 4 times per year    Active Member of [employer] or Organizations: Yes    Attends Banker Meetings: Patient unable to answer    Marital Status: Patient unable to answer  Intimate Partner Violence: Patient Unable To Answer (11/09/2023)   Humiliation, Afraid, Rape, and Kick questionnaire    Fear of Current or Ex-Partner: Patient unable to answer    Emotionally Abused: Patient unable to answer    Physically Abused: Patient unable to answer    Sexually Abused: Patient unable to answer    Family History  Problem Relation Age of Onset   Diabetes Mother    Hypertension Mother    Colon cancer Neg Hx    Stomach cancer Neg Hx    Pancreatic cancer Neg Hx    Esophageal cancer Neg Hx    Liver disease Neg Hx     Past Surgical History:  Procedure Laterality Date   none      ROS: Review of Systems Negative except as stated above  PHYSICAL EXAM: BP (!) 161/81 (BP Location: Left Arm, Patient Position: Sitting, Cuff Size:  Normal)   Pulse (!) 106   Temp 98.2 F (36.8 C) (Oral)   Ht 4' 5 (1.346 m)   Wt 136 lb (61.7 kg)   SpO2 95%   BMI 34.04 kg/m   Physical Exam  General appearance -elderly female who appears uncomfortable.  She continues to hold the midportion of her abdomen and also intermittently her lower back.  She has problems lying back because of the pain in the lower back. Mental status -patient is a poor historian.  Most of the history was gleaned from her daughter Chest - clear to auscultation, no wheezes, rales or rhonchi, symmetric air entry Heart - normal rate, regular rhythm, normal S1, S2, no murmurs, rubs, clicks or gallops Abdomen -normal bowel sounds, soft, mild to moderate diffuse tenderness on palpation. Musculoskeletal -no tenderness on palpation of the lumbar spine.   Extremities -bilateral 1+ lower extremity edema      Latest Ref Rng & Units 03/19/2024    1:39 PM 11/11/2023    9:06 AM 11/08/2023    8:01 PM  CMP  Glucose 70 - 99 mg/dL 712  89  833   BUN 8 - 23 mg/dL 13  15  19    Creatinine 0.44 - 1.00 mg/dL 8.88  9.05  9.19   Sodium 135 - 145 mmol/L 137  136  137   Potassium 3.5 - 5.1 mmol/L 5.3  4.3  4.5   Chloride 98 - 111 mmol/L 100  103  103   CO2 22 - 32 mmol/L 28  24    Calcium  8.9 - 10.3 mg/dL 9.7  9.4    Total Protein 6.5 - 8.1 g/dL 7.5     Total Bilirubin 0.0 - 1.2 mg/dL 0.3     Alkaline Phos 38 - 126 U/L 76     AST 15 - 41 U/L 21     ALT 0 - 44 U/L 11      Lipid Panel     Component Value Date/Time   CHOL 130 11/09/2023 0942   CHOL 136 07/07/2023 1229   TRIG 57 11/09/2023 0942   HDL 73 11/09/2023 0942   HDL 82 07/07/2023 1229   CHOLHDL 1.8 11/09/2023 0942   VLDL 11 11/09/2023  0942   LDLCALC 46 11/09/2023 0942   LDLCALC 37 07/07/2023 1229    CBC    Component Value Date/Time   WBC 9.4 03/19/2024 1339   RBC 3.84 (L) 03/19/2024 1339   HGB 11.3 (L) 03/19/2024 1339   HGB 12.9 10/06/2023 1152   HCT 35.4 (L) 03/19/2024 1339   HCT 38.2 10/06/2023 1152    PLT 316 03/19/2024 1339   PLT 366 10/06/2023 1152   MCV 92.2 03/19/2024 1339   MCV 90 10/06/2023 1152   MCH 29.4 03/19/2024 1339   MCHC 31.9 03/19/2024 1339   RDW 13.8 03/19/2024 1339   RDW 11.9 10/06/2023 1152   LYMPHSABS 2.6 11/08/2023 1954   LYMPHSABS 3.0 03/20/2020 1135   MONOABS 0.7 11/08/2023 1954   EOSABS 0.2 11/08/2023 1954   EOSABS 0.3 03/20/2020 1135   BASOSABS 0.0 11/08/2023 1954   BASOSABS 0.0 03/20/2020 1135    ASSESSMENT AND PLAN:  Assessment and Plan Assessment & Plan Abdominal Pain Intermittent central abdominal pain with vomiting, significant tenderness. Imaging warranted. - Refer to hospital for evaluation and possible CT scan. - Inform hospital of referral. I spoke with Er physician at [facility]  Hypertension Elevated blood pressure likely due to pain and vomiting. Home readings generally normal. Amlodipine  suspected to cause peripheral edema. - Discontinue amlodipine . - Initiate alternative antihypertensive Valsartan 40 mg daily instead - Continue hydralazine  and metoprolol .  Peripheral Edema Bilateral leg swelling possibly related to amlodipine . Furosemide  ineffective. - Discontinue amlodipine . - Continue furosemide  20 mg twice a week.  Diabetes Mellitus HbA1c increased to 8.7%, indicating poor glycemic control.  Insufficient information to make adjustments at this time.  Will have her continue the Lantus /glargine 20 units daily and metformin  500 mg twice a day for now  Chronic Back Pain/radiculopathy Chronic lower back pain radiating to legs. Previous pain management referral not kept. Tylenol  ineffective. - Resubmit referral to pain management. - Prescribe tramadol to use BID in mean time, caution for drowsiness.  Advised patient and daughter that it is a controlled substance/narcotic medication.  [place]  controlled substance reporting system reviewed.       Patient was given the opportunity to ask questions.  Patient verbalized  understanding of the plan and was able to repeat key elements of the plan.   This documentation was completed using Paediatric nurse.  Any transcriptional errors are unintentional.  Orders Placed This Encounter  Procedures   Ambulatory referral to Pain Clinic   POCT glycosylated hemoglobin (Hb A1C)   POCT glucose (manual entry)     Requested Prescriptions   Signed Prescriptions Disp Refills   hydrALAZINE  (APRESOLINE ) 100 MG tablet 270 tablet 1    Sig: Take 1 tablet (100 mg total) by mouth 3 (three) times daily.   valsartan (DIOVAN) 40 MG tablet 90 tablet 3    Sig: Take 1 tablet (40 mg total) by mouth daily.Stop Amlodipine    traMADol (ULTRAM) 50 MG tablet 15 tablet 0    Sig: Take 1 tablet (50 mg total) by mouth every 12 (twelve) hours as needed for moderate pain (pain score 4-6).    Return in about 2 weeks (around 04/02/2024).  Barnie Louder, MD, FACP

## 2024-03-19 NOTE — ED Provider Notes (Signed)
 [place] [facility]GENC[facility] DEPARTMENT AT [facility] Provider Note   CSN: [id number] Arrival date & time: 03/19/24  1328     Patient presents with: Abdominal Pain   Julie Colon is a 89 y.o. female.   Patient here with abdominal pain for the last couple days on and off.  Has had some nausea and vomiting.  No history of abdominal surgery.  History of diabetes.  History of hypertension dementia.  Not sure if this been constipation or diarrhea.  Family member declined interpreter rather interpret herself.  Overall she denies any pain with urination no chest pain or shortness of breath no weakness numbness tingling.  The history is provided by the patient.       Prior to Admission medications   Medication Sig Start Date End Date Taking? Authorizing Provider  pantoprazole  (PROTONIX ) 20 MG tablet Take 1 tablet (20 mg total) by mouth daily for 14 days. 03/19/24 04/02/24 Yes Yatzary Merriweather, DO  sucralfate (CARAFATE) 1 g tablet Take 1 tablet (1 g total) by mouth 4 (four) times daily -  with meals and at bedtime for 14 days. 03/19/24 04/02/24 Yes Tajae Rybicki, DO  acetaminophen  (TYLENOL ) 325 MG tablet Take 650 mg by mouth every 8 (eight) hours.    [provider]  aspirin  EC 81 MG tablet Take 1 tablet (81 mg total) by mouth daily. Swallow whole. 11/18/23   Vicci Barnie NOVAK, MD  fluticasone (FLONASE  SENSIMIST) 27.5 MCG/SPRAY nasal spray Place 2 sprays into the nose daily. 03/04/24 05/03/24  Tobie Eldora NOVAK, MD  furosemide  (LASIX ) 20 MG tablet Take 1 tablet (20 mg total) by mouth 2-3 times a week as needed for swelling in the legs. 12/22/23   Vicci Barnie NOVAK, MD  hydrALAZINE  (APRESOLINE ) 100 MG tablet Take 1 tablet (100 mg total) by mouth 3 (three) times daily. 03/19/24   Vicci Barnie NOVAK, MD  hydroxypropyl methylcellulose / hypromellose (ISOPTO TEARS / GONIOVISC) 2.5 % ophthalmic solution Place 1 drop into both eyes daily as needed for dry eyes. 11/18/23   Vicci Barnie NOVAK, MD  insulin   glargine (LANTUS  SOLOSTAR) 100 UNIT/ML Solostar Pen Inject 20 Units into the skin daily. 10/06/23   Vicci Barnie NOVAK, MD  metFORMIN  (GLUCOPHAGE ) 500 MG tablet Take 1 tablet (500 mg total) by mouth 2 (two) times daily with a meal. 01/27/24   Vicci Barnie NOVAK, MD  metoprolol  tartrate (LOPRESSOR ) 25 MG tablet Take 1 tablet (25 mg total) by mouth 2 (two) times daily. 12/31/23   Vicci Barnie NOVAK, MD  mometasone-formoterol (DULERA) 100-5 MCG/ACT AERO Inhale 2 puffs into the lungs daily.    [provider]  polyvinyl alcohol  (ARTIFICIAL TEARS) 1.4 % ophthalmic solution Place 1 drop into both eyes as needed for dry eyes. 11/18/23   Vicci Barnie NOVAK, MD  rosuvastatin  (CRESTOR ) 10 MG tablet Take 1 tablet (10 mg total) by mouth daily. 11/18/23   Vicci Barnie NOVAK, MD  traMADol (ULTRAM) 50 MG tablet Take 1 tablet (50 mg total) by mouth every 12 (twelve) hours as needed for moderate pain (pain score 4-6). 03/19/24   Vicci Barnie NOVAK, MD  valsartan (DIOVAN) 40 MG tablet Take 1 tablet (40 mg total) by mouth daily.Stop Amlodipine  03/19/24   Vicci Barnie NOVAK, MD    Allergies: Patient has no known allergies.    Review of Systems  Updated Vital Signs BP (!) 141/84   Pulse 93   Temp 98 F (36.7 C) (Oral)   Resp 14   SpO2 97%  Physical Exam Vitals and nursing note reviewed.  Constitutional:      General: She is not in acute distress.    Appearance: She is well-developed.  HENT:     Head: Normocephalic and atraumatic.  Eyes:     Extraocular Movements: Extraocular movements intact.     Conjunctiva/sclera: Conjunctivae normal.     Pupils: Pupils are equal, round, and reactive to light.  Cardiovascular:     Rate and Rhythm: Normal rate and regular rhythm.     Heart sounds: Normal heart sounds. No murmur heard. Pulmonary:     Effort: Pulmonary effort is normal. No respiratory distress.     Breath sounds: Normal breath sounds.  Abdominal:     Palpations: Abdomen is soft.     Tenderness:  There is abdominal tenderness.  Musculoskeletal:        General: No swelling.     Cervical back: Neck supple.  Skin:    General: Skin is warm and dry.     Capillary Refill: Capillary refill takes less than 2 seconds.  Neurological:     Mental Status: She is alert.  Psychiatric:        Mood and Affect: Mood normal.     (all labs ordered are listed, but only abnormal results are displayed) Labs Reviewed  COMPREHENSIVE METABOLIC PANEL WITH GFR - Abnormal; Notable for the following components:      Result Value   Potassium 5.3 (*)    Glucose, Bld 287 (*)    Creatinine, Ser 1.11 (*)    GFR, Estimated 48 (*)    All other components within normal limits  CBC - Abnormal; Notable for the following components:   RBC 3.84 (*)    Hemoglobin 11.3 (*)    HCT 35.4 (*)    All other components within normal limits  URINALYSIS, ROUTINE W REFLEX MICROSCOPIC - Abnormal; Notable for the following components:   Protein, ur 30 (*)    Leukocytes,Ua SMALL (*)    All other components within normal limits  LIPASE, BLOOD    EKG: EKG Interpretation Date/Time:  Friday March 19 2024 15:45:38 EDT Ventricular Rate:  96 PR Interval:  218 QRS Duration:  82 QT Interval:  354 QTC Calculation: 448 R Axis:   32  Text Interpretation: Sinus rhythm Prolonged PR interval Confirmed by Ruthe Cornet [phone]) on 03/19/2024 3:47:28 PM  Radiology: CT ABDOMEN PELVIS W CONTRAST Result Date: 03/19/2024 CLINICAL DATA:  Acute abdominal pain. Nausea and vomiting. 89 year old patient. EXAM: CT ABDOMEN AND PELVIS WITH CONTRAST TECHNIQUE: Multidetector CT imaging of the abdomen and pelvis was performed using the standard protocol following bolus administration of intravenous contrast. RADIATION DOSE REDUCTION: This exam was performed according to the departmental dose-optimization program which includes automated exposure control, adjustment of the mA and/or kV according to patient size and/or use of iterative reconstruction  technique. CONTRAST:  OMNIPAQUE  IOHEXOL  300 MG/ML  SOLN COMPARISON:  None Available. FINDINGS: Lower chest: Consolidation in the periphery and inferior right middle lobe, chronic compared with 04/28/2023 chest CT, likely scarring. There is breathing motion artifact in the lung bases. Small hiatal hernia suspected. Hepatobiliary: No focal liver abnormality. Gallbladder is moderately distended and contains layering gallstones. No pericholecystic inflammation. No biliary dilatation. Pancreas: No ductal dilatation or inflammation. Spleen: Unremarkable, allowing for motion artifact limitations. Adrenals/Urinary Tract: No adrenal nodule. No hydronephrosis or renal calculi. Extrarenal pelvis configuration of the left kidney. No suspicious renal lesion. Unremarkable urinary bladder. Stomach/Bowel: Minimal hiatal hernia. The stomach is physiologically distended.  Question of mild pre pyloric gastric wall thickening, series 2, image 29 no small bowel obstruction or inflammatory change the sigmoid colon is redundant. Moderate volume of colonic stool. No colonic wall thickening or inflammatory change. High-riding cecum in the mid abdomen. The appendix is normal. Vascular/Lymphatic: Aortic atherosclerosis without aneurysm. Portal vein is patent. No acute vascular findings. No abdominopelvic adenopathy. Reproductive: Uterus and bilateral adnexa are unremarkable. Other: No free air, free fluid, or intra-abdominal fluid collection. Musculoskeletal: Diffuse degenerative change throughout the spine. Slight superior endplate compression deformities of L3 and L5 have a chronic appearance. IMPRESSION: 1. Question of mild pre pyloric gastric wall thickening, can be seen with gastritis or peptic ulcer disease. 2. Cholelithiasis without CT findings of acute cholecystitis. Aortic Atherosclerosis (ICD10-I70.0). Electronically Signed   By: Andrea Gasman M.D.   On: 03/19/2024 16:12     Procedures   Medications Ordered in the ED   alum & mag hydroxide-simeth (MAALOX/MYLANTA) 200-200-20 MG/5ML suspension 30 mL (has no administration in time range)  sodium chloride  0.9 % bolus 1,000 mL (1,000 mLs Intravenous New Bag/Given 03/19/24 1532)  ondansetron  (ZOFRAN ) injection 4 mg (4 mg Intravenous Given 03/19/24 1534)  iohexol  (OMNIPAQUE ) 300 MG/ML solution 100 mL (100 mLs Intravenous Contrast Given 03/19/24 1551)                                    Medical Decision Making Amount and/or Complexity of Data Reviewed Labs: ordered. Radiology: ordered.  Risk OTC drugs. Prescription drug management.   Julie Colon is here with abdominal pain.  Normal vitals.  No fever.  History of diabetes.  Differential diagnosis viral process versus constipation versus less likely bowel obstruction appendicitis UTI.  Will check CBC CMP lipase urinalysis CT scan abdomen pelvis.  Will give IV fluids IV Zofran  and reevaluate.  Lab work per my review and interpretation shows no significant leukocytosis anemia or electrolyte abnormality.  Urinalysis negative for infection.  EKG shows sinus rhythm.  No ischemic changes.  Awaiting CT scan abdomen and pelvis.  Per my review interpretation of CT report there is no acute findings.  May be some gastritis.  Will treat with Carafate and Protonix .  Discharged in good condition.  There is gallstones but no evidence of cholecystitis.  Close follow-up with primary care.  Told return if symptoms worsen.  This chart was dictated using voice recognition software.  Despite best efforts to proofread,  errors can occur which can change the documentation meaning.      Final diagnoses:  Abdominal pain, unspecified abdominal location    ED Discharge Orders          Ordered    sucralfate (CARAFATE) 1 g tablet  3 times daily with meals & bedtime        03/19/24 1638    pantoprazole  (PROTONIX ) 20 MG tablet  Daily        03/19/24 1638               Ruthe Cornet, DO 03/19/24 1639

## 2024-03-19 NOTE — ED Notes (Addendum)
 Patient in distress; moaning, writhing around in bed; Charge RN and This Rn to bedside, visitor stepped out, so interpreter machine used to understand new concerns; EKG conducted for chest pain described as a choking sensation; Provider notified

## 2024-03-19 NOTE — ED Triage Notes (Signed)
 Pt referred by primary for intermittent 5/10 abd pain all over. Vomiting yesterday after breakfast, no diarrhea/ urinary symptoms. Last BM this AM

## 2024-03-22 ENCOUNTER — Other Ambulatory Visit: Payer: Self-pay

## 2024-04-01 ENCOUNTER — Telehealth: Payer: Self-pay | Admitting: Internal Medicine

## 2024-04-01 NOTE — Telephone Encounter (Signed)
 Called pt to confirm appt LVM

## 2024-04-02 ENCOUNTER — Ambulatory Visit: Admitting: Internal Medicine

## 2024-04-02 ENCOUNTER — Ambulatory Visit: Attending: Internal Medicine | Admitting: Internal Medicine

## 2024-04-02 ENCOUNTER — Other Ambulatory Visit: Payer: Self-pay | Admitting: Internal Medicine

## 2024-04-02 ENCOUNTER — Other Ambulatory Visit: Payer: Self-pay

## 2024-04-02 VITALS — BP 134/81 | HR 102 | Temp 97.6°F | Ht <= 58 in | Wt 134.0 lb

## 2024-04-02 DIAGNOSIS — E1165 Type 2 diabetes mellitus with hyperglycemia: Secondary | ICD-10-CM

## 2024-04-02 DIAGNOSIS — R600 Localized edema: Secondary | ICD-10-CM

## 2024-04-02 DIAGNOSIS — E1169 Type 2 diabetes mellitus with other specified complication: Secondary | ICD-10-CM | POA: Diagnosis not present

## 2024-04-02 DIAGNOSIS — G8929 Other chronic pain: Secondary | ICD-10-CM

## 2024-04-02 DIAGNOSIS — Z794 Long term (current) use of insulin: Secondary | ICD-10-CM

## 2024-04-02 DIAGNOSIS — I152 Hypertension secondary to endocrine disorders: Secondary | ICD-10-CM | POA: Diagnosis not present

## 2024-04-02 DIAGNOSIS — M5416 Radiculopathy, lumbar region: Secondary | ICD-10-CM | POA: Diagnosis not present

## 2024-04-02 DIAGNOSIS — E119 Type 2 diabetes mellitus without complications: Secondary | ICD-10-CM

## 2024-04-02 DIAGNOSIS — R269 Unspecified abnormalities of gait and mobility: Secondary | ICD-10-CM | POA: Diagnosis not present

## 2024-04-02 DIAGNOSIS — K219 Gastro-esophageal reflux disease without esophagitis: Secondary | ICD-10-CM | POA: Diagnosis not present

## 2024-04-02 DIAGNOSIS — Z7984 Long term (current) use of oral hypoglycemic drugs: Secondary | ICD-10-CM

## 2024-04-02 DIAGNOSIS — Z23 Encounter for immunization: Secondary | ICD-10-CM

## 2024-04-02 MED ORDER — LANTUS SOLOSTAR 100 UNIT/ML ~~LOC~~ SOPN
23.0000 [IU] | PEN_INJECTOR | Freq: Every day | SUBCUTANEOUS | 3 refills | Status: AC
  Filled 2024-04-02: qty 15, 65d supply, fill #0
  Filled 2024-06-02: qty 15, 65d supply, fill #1

## 2024-04-02 MED ORDER — TRAMADOL HCL 50 MG PO TABS
50.0000 mg | ORAL_TABLET | Freq: Three times a day (TID) | ORAL | 1 refills | Status: AC | PRN
  Filled 2024-04-02: qty 90, 30d supply, fill #0

## 2024-04-02 MED ORDER — ZOSTER VAC RECOMB ADJUVANTED 50 MCG/0.5ML IM SUSR
0.5000 mL | Freq: Once | INTRAMUSCULAR | 0 refills | Status: AC
  Filled 2024-04-02: qty 0.5, 1d supply, fill #0

## 2024-04-02 NOTE — Progress Notes (Signed)
 Patient ID: Julie Colon, female    DOB: Feb 12, 1934  MRN: [id number]  CC: Follow-up (ER f/u./Difficulty walking due to increased back pain - requesting wheelchair /Reports tramadol  not really helping pt )   Subjective: [name] is a 89 y.o. female who presents for chronic ds management. 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  History of Present Illness Julie Colon is an 89 year old female who presents for follow-up after an ER visit for abdominal and back pain. She is accompanied by her daughter Odella and granddaughter Arland. Odella interprets and provides most of the history; wavier signed on previous visit.  She was previously seen on March 19, 2024, and was sent to the ER for acute GI symptoms. CT scan of the abdomen showed minimal hiatal hernia, a little thickening of the pre-pyloric wall which can be seen with gastritis or PUD.  It also showed cholelithiasis without findings of acute cholecystitis.  She was discharged home with pantoprazole  and sucralfate  which she has been taking. She has not experienced any further abdominal pain or vomiting since then.  Lumbar radiculopathy: She continues to experience significant back pain, which has worsened over the past two days and radiates down both legs. She uses a walker at home but needs a wheelchair to use when she has to leave the house to go to doctors visit as she is unable to ambulate long distances with a walker.  Has a cane but not able to maintain balance with it. No falls. She was started on tramadol  for pain management, which she takes twice a day, but she continues to complain of  pain.  Daughter reports she has not had any significant side effects from the tramadol . Referred to pain management but has not received a call as yet.  HTN: Her blood pressure medication, amlodipine , was stopped due to leg swelling, which has since decreased but still has some residual swelling. Has compression below knee stockings but no longer useable as it is all stretched out. Her blood pressure readings at home have been stable, with a recent reading of 122/80. She is limiting salt intake in her diet.  Currently on hydralazine  100 mg 3 times a day, furosemide  20 mg as needed and Diovan  40 mg daily.  DM: Her diabetes management includes glargine insulin  20 units and metformin  500 mg twice a day. Has blood sugar log with her today. Her blood sugars are checked twice daily, with morning readings higher than desired, ranging from 148 to 2280. She consumes hot milk sweeten with sugar before bed. Her A1c was 8.7 earlier this month, up from previous readings.      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   Essential hypertension 03/20/2020   Hyponatremia 03/02/2020   Sepsis due to pneumonia (HCC) 03/02/2020   Noncompliance 03/02/2020   Leukocytosis 03/02/2020   Advanced age 27/09/2019   Pneumonia 03/02/2020   Hyperglycemic hyperosmolar nonketotic coma (HCC) 01/14/2020   Type 2 diabetes mellitus with  complication, without long-term current use of insulin  (HCC) 01/14/2020   Hyperkalemia 01/14/2020   AKI (acute kidney injury) (HCC) 01/14/2020     Current Outpatient Medications on File Prior to Visit  Medication Sig Dispense Refill   acetaminophen  (TYLENOL ) 325 MG tablet Take 650 mg by mouth every 8 (eight) hours.     aspirin  EC 81 MG tablet Take 1 tablet (81 mg total) by mouth daily.  Swallow whole.     fluticasone (FLONASE  SENSIMIST) 27.5 MCG/SPRAY nasal spray Place 2 sprays into the nose daily. 10 g 12   furosemide  (LASIX ) 20 MG tablet Take 1 tablet (20 mg total) by mouth 2-3 times a week as needed for swelling in the legs. 36 tablet 3   hydrALAZINE  (APRESOLINE ) 100 MG tablet Take 1 tablet (100 mg total) by mouth 3 (three) times daily. 270 tablet 1   hydroxypropyl methylcellulose / hypromellose (ISOPTO TEARS / GONIOVISC) 2.5 % ophthalmic solution Place 1 drop into both eyes daily as needed for dry eyes. 15 mL 1   metFORMIN  (GLUCOPHAGE ) 500 MG tablet Take 1 tablet (500 mg total) by mouth 2 (two) times daily with a meal. 60 tablet 5   metoprolol  tartrate (LOPRESSOR ) 25 MG tablet Take 1 tablet (25 mg total) by mouth 2 (two) times daily. 60 tablet 5   mometasone-formoterol (DULERA) 100-5 MCG/ACT AERO Inhale 2 puffs into the lungs daily.     pantoprazole  (PROTONIX ) 20 MG tablet Take 1 tablet (20 mg total) by mouth daily for 14 days. 14 tablet 0   polyvinyl alcohol  (ARTIFICIAL TEARS) 1.4 % ophthalmic solution Place 1 drop into both eyes as needed for dry eyes. 15 mL 0   rosuvastatin  (CRESTOR ) 10 MG tablet Take 1 tablet (10 mg total) by mouth daily. 90 tablet 3   valsartan  (DIOVAN ) 40 MG tablet Take 1 tablet (40 mg total) by mouth daily.Stop Amlodipine  90 tablet 3   No current facility-administered medications on file prior to visit.    No Known Allergies  Social History   Socioeconomic History   Marital status: Single    Spouse name: Not on file   Number of children: Not on file   Years of education: Not on file   Highest education level: Not on file  Occupational History   Not on file  Tobacco Use   Smoking status: Former   Smokeless tobacco: Never  Vaping Use   Vaping status: Never Used  Substance and Sexual Activity   Alcohol  use: No   Drug use: No   Sexual activity: Not Currently  Other Topics Concern   Not on file  Social History Narrative   Speaks [place]  (from the [place])   Moved to [facility] May 1. 2021 from [place] .  Daughter not well versed in mother's care yet   Social Drivers of Health   Financial Resource Strain: Low Risk  (10/06/2023)   Overall Financial Resource Strain (CARDIA)    Difficulty of Paying Living Expenses: Not very hard  Food Insecurity: Patient Unable To Answer (11/09/2023)   Hunger Vital Sign    Worried About Running Out of Food in the Last Year: Patient unable to answer    Ran Out of Food in the Last Year: Patient unable to answer  Transportation Needs: No Transportation Needs (11/09/2023)   PRAPARE - Administrator, Civil Service (Medical): No    Lack of Transportation (Non-Medical): No  Physical Activity: Inactive (10/06/2023)   Exercise Vital Sign    Days of Exercise per  Week: 0 days    Minutes of Exercise per Session: 0 min  Stress: Stress Concern Present (10/06/2023)   [employer] of Occupational Health - Occupational Stress Questionnaire    Feeling of Stress : To some extent  Social Connections: Unknown (11/09/2023)   Social Connection and Isolation Panel    Frequency of Communication with Friends and Family: More than three times a week    Frequency of Social Gatherings with Friends and Family: Patient unable to answer    Attends Religious Services: More than 4 times per year    Active Member of [employer] or Organizations: Yes    Attends Banker Meetings: Patient unable to answer    Marital Status: Patient unable to answer  Intimate Partner Violence: Patient Unable To Answer (11/09/2023)   Humiliation, Afraid, Rape, and Kick questionnaire    Fear of Current or Ex-Partner: Patient unable to answer    Emotionally Abused: Patient unable to answer    Physically Abused: Patient unable to answer    Sexually Abused: Patient unable to answer    Family History  Problem Relation Age of Onset   Diabetes Mother    Hypertension Mother    Colon cancer Neg Hx    Stomach cancer Neg Hx    Pancreatic  cancer Neg Hx    Esophageal cancer Neg Hx    Liver disease Neg Hx     Past Surgical History:  Procedure Laterality Date   none      ROS: Review of Systems Negative except as stated above  PHYSICAL EXAM: BP 134/81 (BP Location: Left Arm, Patient Position: Sitting, Cuff Size: Normal)   Pulse (!) 102   Temp 97.6 F (36.4 C) (Oral)   Ht 4' 5 (1.346 m)   Wt 134 lb (60.8 kg)   SpO2 97%   BMI 33.54 kg/m   Physical Exam  General appearance - alert, well appearing, elderly female sitting in WC and in no distress Mental status - pt answers some questions but relies on daughter and grand-daughter to provide history Chest - clear to auscultation, no wheezes, rales or rhonchi, symmetric air entry Heart - normal rate, regular rhythm, normal S1, S2, no murmurs, rubs, clicks or gallops Extremities - edema in LE has significantly decreased. LLE slightly larger than RT       Latest Ref Rng & Units 03/19/2024    1:39 PM 11/11/2023    9:06 AM 11/08/2023    8:01 PM  CMP  Glucose 70 - 99 mg/dL 712  89  833   BUN 8 - 23 mg/dL 13  15  19    Creatinine 0.44 - 1.00 mg/dL 8.88  9.05  9.19   Sodium 135 - 145 mmol/L 137  136  137   Potassium 3.5 - 5.1 mmol/L 5.3  4.3  4.5   Chloride 98 - 111 mmol/L 100  103  103   CO2 22 - 32 mmol/L 28  24    Calcium  8.9 - 10.3 mg/dL 9.7  9.4    Total Protein 6.5 - 8.1 g/dL 7.5     Total Bilirubin 0.0 - 1.2 mg/dL 0.3     Alkaline Phos 38 - 126 U/L 76     AST 15 - 41 U/L 21     ALT 0 - 44 U/L 11      Lipid Panel     Component Value Date/Time   CHOL 130 11/09/2023 0942   CHOL 136 07/07/2023 1229   TRIG 57 11/09/2023  0942   HDL 73 11/09/2023 0942   HDL 82 07/07/2023 1229   CHOLHDL 1.8 11/09/2023 0942   VLDL 11 11/09/2023 0942   LDLCALC 46 11/09/2023 0942   LDLCALC 37 07/07/2023 1229    CBC    Component Value Date/Time   WBC 9.4 03/19/2024 1339   RBC 3.84 (L) 03/19/2024 1339   HGB 11.3 (L) 03/19/2024 1339   HGB 12.9 10/06/2023 1152   HCT 35.4 (L)  03/19/2024 1339   HCT 38.2 10/06/2023 1152   PLT 316 03/19/2024 1339   PLT 366 10/06/2023 1152   MCV 92.2 03/19/2024 1339   MCV 90 10/06/2023 1152   MCH 29.4 03/19/2024 1339   MCHC 31.9 03/19/2024 1339   RDW 13.8 03/19/2024 1339   RDW 11.9 10/06/2023 1152   LYMPHSABS 2.6 11/08/2023 1954   LYMPHSABS 3.0 03/20/2020 1135   MONOABS 0.7 11/08/2023 1954   EOSABS 0.2 11/08/2023 1954   EOSABS 0.3 03/20/2020 1135   BASOSABS 0.0 11/08/2023 1954   BASOSABS 0.0 03/20/2020 1135    ASSESSMENT AND PLAN: 1. Type 2 diabetes mellitus with other specified complication, with long-term current use of insulin  (HCC) (Primary) -DM treated with oral medication Fasting morning sugars are higher than we would like it to be. - Increase Lantus  insulin  to 23 units. Continue Metformin . Monitor GFR. - Substitute sugar with Splenda in milk and coffee. - Order diabetic eye exam. - insulin  glargine (LANTUS  SOLOSTAR) 100 UNIT/ML Solostar Pen; Inject 23 Units into the skin daily.  Dispense: 15 mL; Refill: 3  2. Hypertension associated with type 2 diabetes mellitus (HCC) Close to goal.  Continue valsartan  40 mg daily, hydralazine  100mg  three times a day and metoprolol  25 mg BID.  3. Chronic radicular pain of lower back Await appointment with pain management. In the meantime we will increase the tramadol  frequency to every 8 hours.  Advised to monitor for any increased drowsiness or constipation.  I went over chronic pain management with opioids prescribing agreement with patient and daughter had daughter sign. NCCSRS reviewed  - traMADol  (ULTRAM ) 50 MG tablet; Take 1 tablet (50 mg total) by mouth every 8 (eight) hours as needed for moderate pain (pain score 4-6).  Dispense: 90 tablet; Refill: 1 - DME Wheelchair manual  4. Gait disturbance Will send prescription to [facility] to try to get manual wheelchair since she is unable to ambulate more than 100 feet even with her walker. - DME Wheelchair manual  5.  Edema of both lower legs Decreased since amlodipine  was discontinued. Recommend purchasing compression stockings on [employer] and having her wear daily  6. Need for shingles vaccine Printed rxn given for them to take to pharmacy - Zoster Vaccine Adjuvanted (SHINGRIX ) injection; Inject 0.5 mLs into the muscle once for 1 dose.  Dispense: 0.5 mL; Refill: 0  7. GERD Stop Sucralfate  once she completes prescription that was given in the ER.  Continue pantoprazole .  Patient and daughter were given the opportunity to ask questions.  Daughter verbalized understanding of the plan.   This documentation was completed using Paediatric nurse.  Any transcriptional errors are unintentional.  Orders Placed This Encounter  Procedures   DME Wheelchair manual   Ambulatory referral to Ophthalmology     Requested Prescriptions   Signed Prescriptions Disp Refills   traMADol  (ULTRAM ) 50 MG tablet 90 tablet 1    Sig: Take 1 tablet (50 mg total) by mouth every 8 (eight) hours as needed for moderate pain (pain score 4-6).  Zoster Vaccine Adjuvanted (SHINGRIX ) injection 0.5 mL 0    Sig: Inject 0.5 mLs into the muscle once for 1 dose.   insulin  glargine (LANTUS  SOLOSTAR) 100 UNIT/ML Solostar Pen 15 mL 3    Sig: Inject 23 Units into the skin daily.    Return in about 3 months (around 07/03/2024) for 6 weeks with clinical pharmacist for DM.  Barnie Louder, MD, FACP

## 2024-04-02 NOTE — Telephone Encounter (Signed)
 Copied from CRM [id number]. Topic: Appointments - Appointment Scheduling >> Apr 02, 2024  9:09 AM Turkey B wrote:  Patient's daughter called in running behind but says will be there in 10 minutes

## 2024-04-02 NOTE — Telephone Encounter (Signed)
 Noted

## 2024-04-03 ENCOUNTER — Encounter: Payer: Self-pay | Admitting: Internal Medicine

## 2024-04-05 ENCOUNTER — Other Ambulatory Visit: Payer: Self-pay

## 2024-04-20 ENCOUNTER — Other Ambulatory Visit: Payer: Self-pay

## 2024-04-21 ENCOUNTER — Other Ambulatory Visit: Payer: Self-pay

## 2024-04-23 ENCOUNTER — Other Ambulatory Visit: Payer: Self-pay

## 2024-04-27 ENCOUNTER — Other Ambulatory Visit: Payer: Self-pay

## 2024-04-27 DIAGNOSIS — M5416 Radiculopathy, lumbar region: Secondary | ICD-10-CM | POA: Diagnosis not present

## 2024-04-27 DIAGNOSIS — R262 Difficulty in walking, not elsewhere classified: Secondary | ICD-10-CM | POA: Diagnosis not present

## 2024-05-04 ENCOUNTER — Other Ambulatory Visit: Payer: Self-pay | Admitting: Internal Medicine

## 2024-05-04 ENCOUNTER — Other Ambulatory Visit: Payer: Self-pay

## 2024-05-04 MED ORDER — PANTOPRAZOLE SODIUM 20 MG PO TBEC
20.0000 mg | DELAYED_RELEASE_TABLET | Freq: Every day | ORAL | 0 refills | Status: AC
  Filled 2024-05-04: qty 14, 14d supply, fill #0

## 2024-05-06 ENCOUNTER — Other Ambulatory Visit: Payer: Self-pay

## 2024-05-27 ENCOUNTER — Encounter: Payer: Self-pay | Admitting: Physical Medicine & Rehabilitation

## 2024-06-02 ENCOUNTER — Other Ambulatory Visit: Payer: Self-pay

## 2024-06-02 ENCOUNTER — Other Ambulatory Visit: Payer: Self-pay | Admitting: Internal Medicine

## 2024-06-02 DIAGNOSIS — R262 Difficulty in walking, not elsewhere classified: Secondary | ICD-10-CM | POA: Diagnosis not present

## 2024-06-02 DIAGNOSIS — M5416 Radiculopathy, lumbar region: Secondary | ICD-10-CM | POA: Diagnosis not present

## 2024-06-03 ENCOUNTER — Other Ambulatory Visit: Payer: Self-pay

## 2024-06-15 ENCOUNTER — Encounter: Payer: Self-pay | Admitting: Internal Medicine

## 2024-06-16 DIAGNOSIS — M5410 Radiculopathy, site unspecified: Secondary | ICD-10-CM | POA: Diagnosis not present

## 2024-06-16 DIAGNOSIS — I10 Essential (primary) hypertension: Secondary | ICD-10-CM | POA: Diagnosis not present

## 2024-06-16 DIAGNOSIS — G3289 Other specified degenerative disorders of nervous system in diseases classified elsewhere: Secondary | ICD-10-CM | POA: Diagnosis not present

## 2024-06-24 ENCOUNTER — Encounter: Attending: Physical Medicine & Rehabilitation | Admitting: Physical Medicine & Rehabilitation

## 2024-07-05 ENCOUNTER — Ambulatory Visit: Admitting: Internal Medicine

## 2024-07-08 ENCOUNTER — Other Ambulatory Visit: Payer: Self-pay | Admitting: Pharmacist

## 2024-07-08 ENCOUNTER — Telehealth: Payer: Self-pay | Admitting: Pharmacist

## 2024-07-08 NOTE — Progress Notes (Signed)
 Pharmacy Quality Measure Review  This patient is appearing on a report for being at risk of failing the adherence measure for hypertension (ACEi/ARB) medications this calendar year.   Medication: valsartan  Last fill date: 05/06/2024 for 90 day supply  Insurance report was not up to date. No action needed at this time. Reminder set for 08/04/2024 fill.   Herlene Fleeta Morris, PharmD, JAQUELINE, CPP Clinical Pharmacist [facility] [phone]

## 2024-07-08 NOTE — Telephone Encounter (Signed)
Message sent to reschedule

## 2024-07-08 NOTE — Telephone Encounter (Signed)
 This patient is [facility] and I monitor her adherence. Was just noticing that she missed her last appt with Dr. Vicci on 07/05/2024. Can we try and schedule her for a follow-up?

## 2024-08-04 ENCOUNTER — Other Ambulatory Visit: Payer: Self-pay

## 2024-08-20 ENCOUNTER — Telehealth: Payer: Self-pay

## 2024-08-20 NOTE — Telephone Encounter (Signed)
 Patient was identified as falling into the [employer] [place] Measure - Diabetes.   Patient was: Left voicemail to schedule with primary care provider.

## 2024-08-23 ENCOUNTER — Encounter (INDEPENDENT_AMBULATORY_CARE_PROVIDER_SITE_OTHER): Payer: Self-pay

## 2024-08-23 ENCOUNTER — Telehealth (INDEPENDENT_AMBULATORY_CARE_PROVIDER_SITE_OTHER): Payer: Self-pay | Admitting: Audiology

## 2024-08-23 NOTE — Telephone Encounter (Signed)
 Called the patient via Doloris Silvas (Interpreter ID # [id number]).  Left a voicemail message requesting a call back to reschedule the hearing evaluation appointment on 09/06/2024 due to Dr. Tiney will be out of the office.  Follow up appointment with Dr. Tobie that day will need to be rescheduled as well until after the hearing evaluation is done.  Also sent MyChart message.

## 2024-09-03 ENCOUNTER — Encounter (INDEPENDENT_AMBULATORY_CARE_PROVIDER_SITE_OTHER): Payer: Self-pay

## 2024-09-03 ENCOUNTER — Telehealth (INDEPENDENT_AMBULATORY_CARE_PROVIDER_SITE_OTHER): Payer: Self-pay | Admitting: Audiology

## 2024-09-03 NOTE — Telephone Encounter (Signed)
 Called patient via interpreter Arma, [place] # [phone]) and left a voicemail message requesting a call back to reschedule appointments.  Dr. Tiney will be out of the office.  Also sent a MyChart message.

## 2024-09-06 ENCOUNTER — Ambulatory Visit (INDEPENDENT_AMBULATORY_CARE_PROVIDER_SITE_OTHER): Admitting: Otolaryngology

## 2024-09-06 ENCOUNTER — Ambulatory Visit (INDEPENDENT_AMBULATORY_CARE_PROVIDER_SITE_OTHER): Admitting: Audiology

## 2024-09-09 ENCOUNTER — Encounter (INDEPENDENT_AMBULATORY_CARE_PROVIDER_SITE_OTHER): Payer: Self-pay

## 2024-10-02 ENCOUNTER — Encounter: Payer: Self-pay | Admitting: Internal Medicine
